# Patient Record
Sex: Male | Born: 1945
Health system: Southern US, Community
[De-identification: ages and names within clinical notes are randomized; demographics above are authoritative.]

## PROBLEM LIST (undated history)

## (undated) DIAGNOSIS — S62109A Fracture of unspecified carpal bone, unspecified wrist, initial encounter for closed fracture: Secondary | ICD-10-CM

## (undated) DIAGNOSIS — Z973 Presence of spectacles and contact lenses: Secondary | ICD-10-CM

## (undated) DIAGNOSIS — E119 Type 2 diabetes mellitus without complications: Secondary | ICD-10-CM

## (undated) DIAGNOSIS — I77810 Thoracic aortic ectasia: Secondary | ICD-10-CM

## (undated) DIAGNOSIS — K219 Gastro-esophageal reflux disease without esophagitis: Secondary | ICD-10-CM

## (undated) DIAGNOSIS — I5189 Other ill-defined heart diseases: Secondary | ICD-10-CM

## (undated) DIAGNOSIS — I10 Essential (primary) hypertension: Secondary | ICD-10-CM

## (undated) DIAGNOSIS — M5134 Other intervertebral disc degeneration, thoracic region: Secondary | ICD-10-CM

## (undated) DIAGNOSIS — L570 Actinic keratosis: Secondary | ICD-10-CM

## (undated) DIAGNOSIS — E785 Hyperlipidemia, unspecified: Secondary | ICD-10-CM

## (undated) DIAGNOSIS — I700 Atherosclerosis of aorta: Secondary | ICD-10-CM

## (undated) DIAGNOSIS — I480 Paroxysmal atrial fibrillation: Secondary | ICD-10-CM

## (undated) DIAGNOSIS — M1990 Unspecified osteoarthritis, unspecified site: Secondary | ICD-10-CM

## (undated) DIAGNOSIS — N2889 Other specified disorders of kidney and ureter: Secondary | ICD-10-CM

## (undated) DIAGNOSIS — R300 Dysuria: Secondary | ICD-10-CM

## (undated) DIAGNOSIS — K2270 Barrett's esophagus without dysplasia: Secondary | ICD-10-CM

## (undated) DIAGNOSIS — K760 Fatty (change of) liver, not elsewhere classified: Secondary | ICD-10-CM

## (undated) HISTORY — PX: PLANTAR'S WART EXCISION: SHX2240

## (undated) HISTORY — DX: Actinic keratosis: L57.0

## (undated) HISTORY — DX: Fracture of unspecified carpal bone, unspecified wrist, initial encounter for closed fracture: S62.109A

## (undated) HISTORY — DX: Dysuria: R30.0

## (undated) HISTORY — PX: STRABISMUS SURGERY: SHX218

## (undated) HISTORY — DX: Hyperlipidemia, unspecified: E78.5

## (undated) HISTORY — PX: EYE SURGERY: SHX253

---

## 2003-10-01 HISTORY — PX: COLONOSCOPY: SHX174

## 2004-07-23 ENCOUNTER — Ambulatory Visit: Payer: Self-pay | Admitting: General Surgery

## 2008-08-01 ENCOUNTER — Ambulatory Visit: Payer: Self-pay

## 2008-08-15 ENCOUNTER — Ambulatory Visit: Payer: Self-pay | Admitting: Family Medicine

## 2009-11-18 ENCOUNTER — Observation Stay: Payer: Self-pay | Admitting: Oncology

## 2014-03-22 LAB — PSA: PSA: 3.4

## 2014-03-23 LAB — FECAL OCCULT BLOOD, GUAIAC: FECAL OCCULT BLD: NEGATIVE

## 2014-05-23 ENCOUNTER — Ambulatory Visit: Payer: Self-pay | Admitting: Family Medicine

## 2014-11-11 DIAGNOSIS — L570 Actinic keratosis: Secondary | ICD-10-CM | POA: Diagnosis not present

## 2014-11-11 DIAGNOSIS — L814 Other melanin hyperpigmentation: Secondary | ICD-10-CM | POA: Diagnosis not present

## 2014-11-11 DIAGNOSIS — L578 Other skin changes due to chronic exposure to nonionizing radiation: Secondary | ICD-10-CM | POA: Diagnosis not present

## 2014-11-29 DIAGNOSIS — I10 Essential (primary) hypertension: Secondary | ICD-10-CM | POA: Diagnosis not present

## 2014-11-29 DIAGNOSIS — E780 Pure hypercholesterolemia: Secondary | ICD-10-CM | POA: Diagnosis not present

## 2014-11-29 DIAGNOSIS — E119 Type 2 diabetes mellitus without complications: Secondary | ICD-10-CM | POA: Diagnosis not present

## 2014-11-29 LAB — CBC AND DIFFERENTIAL
HEMATOCRIT: 47 % (ref 41–53)
Hemoglobin: 15.8 g/dL (ref 13.5–17.5)
Neutrophils Absolute: 55 /uL
PLATELETS: 284 10*3/uL (ref 150–399)
WBC: 5.4 10*3/mL

## 2014-11-29 LAB — HEMOGLOBIN A1C: HEMOGLOBIN A1C: 7.1 % — AB (ref 4.0–6.0)

## 2014-11-29 LAB — HEPATIC FUNCTION PANEL
ALK PHOS: 75 U/L (ref 25–125)
ALT: 21 U/L (ref 10–40)
AST: 22 U/L (ref 14–40)
Bilirubin, Total: 1.3 mg/dL

## 2014-11-29 LAB — BASIC METABOLIC PANEL
BUN: 16 mg/dL (ref 4–21)
CREATININE: 1 mg/dL (ref 0.6–1.3)
GLUCOSE: 140 mg/dL
POTASSIUM: 5 mmol/L (ref 3.4–5.3)
Sodium: 138 mmol/L (ref 137–147)

## 2014-11-29 LAB — LIPID PANEL
Cholesterol: 175 mg/dL (ref 0–200)
HDL: 75 mg/dL — AB (ref 35–70)
LDL CALC: 81 mg/dL
Triglycerides: 96 mg/dL (ref 40–160)

## 2015-01-09 DIAGNOSIS — L578 Other skin changes due to chronic exposure to nonionizing radiation: Secondary | ICD-10-CM | POA: Diagnosis not present

## 2015-01-09 DIAGNOSIS — L570 Actinic keratosis: Secondary | ICD-10-CM | POA: Diagnosis not present

## 2015-01-09 DIAGNOSIS — D229 Melanocytic nevi, unspecified: Secondary | ICD-10-CM | POA: Diagnosis not present

## 2015-01-09 DIAGNOSIS — L821 Other seborrheic keratosis: Secondary | ICD-10-CM | POA: Diagnosis not present

## 2015-01-09 DIAGNOSIS — L814 Other melanin hyperpigmentation: Secondary | ICD-10-CM | POA: Diagnosis not present

## 2015-01-09 DIAGNOSIS — L720 Epidermal cyst: Secondary | ICD-10-CM | POA: Diagnosis not present

## 2015-02-02 DIAGNOSIS — S46009A Unspecified injury of muscle(s) and tendon(s) of the rotator cuff of unspecified shoulder, initial encounter: Secondary | ICD-10-CM | POA: Insufficient documentation

## 2015-02-02 DIAGNOSIS — E119 Type 2 diabetes mellitus without complications: Secondary | ICD-10-CM | POA: Insufficient documentation

## 2015-02-02 DIAGNOSIS — K30 Functional dyspepsia: Secondary | ICD-10-CM | POA: Insufficient documentation

## 2015-02-02 DIAGNOSIS — I10 Essential (primary) hypertension: Secondary | ICD-10-CM | POA: Insufficient documentation

## 2015-03-30 ENCOUNTER — Encounter: Payer: Self-pay | Admitting: Family Medicine

## 2015-03-30 ENCOUNTER — Ambulatory Visit (INDEPENDENT_AMBULATORY_CARE_PROVIDER_SITE_OTHER): Payer: Commercial Managed Care - HMO | Admitting: Family Medicine

## 2015-03-30 VITALS — BP 128/78 | HR 64 | Temp 97.7°F | Resp 16 | Ht 74.75 in | Wt 256.2 lb

## 2015-03-30 DIAGNOSIS — Z0000 Encounter for general adult medical examination without abnormal findings: Secondary | ICD-10-CM

## 2015-03-30 DIAGNOSIS — E119 Type 2 diabetes mellitus without complications: Secondary | ICD-10-CM

## 2015-03-30 DIAGNOSIS — I10 Essential (primary) hypertension: Secondary | ICD-10-CM

## 2015-03-30 LAB — POCT URINALYSIS DIPSTICK
BILIRUBIN UA: NEGATIVE
Blood, UA: NEGATIVE
Ketones, UA: NEGATIVE
Leukocytes, UA: NEGATIVE
Nitrite, UA: NEGATIVE
PROTEIN UA: NEGATIVE
Spec Grav, UA: 1.02
UROBILINOGEN UA: 0.2
pH, UA: 6

## 2015-03-30 LAB — POCT GLYCOSYLATED HEMOGLOBIN (HGB A1C): Hemoglobin A1C: 6.5

## 2015-03-30 MED ORDER — ONETOUCH ULTRASOFT LANCETS MISC: Status: AC

## 2015-03-30 NOTE — Progress Notes (Signed)
Subjective:    Patient ID: Steven Miranda, male    DOB: 11-12-1945, 69 y.o.   MRN: [id number]  HPI Annual Exam  Patient Active Problem List   Diagnosis Date Noted  . Acid indigestion 02/02/2015  . Essential (primary) hypertension 02/02/2015  . Injury of tendon of rotator cuff 02/02/2015  . Diabetes mellitus, type 2 02/02/2015  . Diabetes 10/25/2008  . HLD (hyperlipidemia) 05/19/2007   Past Surgical History  Procedure Laterality Date  . Strabismus surgery Right    History  Substance Use Topics  . Smoking status: Former Research scientist (life sciences)  . Smokeless tobacco: Not on file  . Alcohol Use: 0.6 - 1.2 oz/week    1-2 Standard drinks or equivalent per week   Family History  Problem Relation Age of Onset  . Cancer Mother   . Alcohol abuse Father   . Heart disease Maternal Uncle   . Rheumatic fever Maternal Uncle   . Diabetes Paternal Grandmother   . Emphysema Paternal Grandfather    Current Outpatient Prescriptions on File Prior to Visit  Medication Sig Dispense Refill  . amLODipine (NORVASC) 5 MG tablet Take 1 tablet by mouth at bedtime.    Marland Kitchen aspirin 81 MG tablet Take 1 tablet by mouth daily.    Marland Kitchen atorvastatin (LIPITOR) 20 MG tablet Take 1 tablet by mouth daily.    . canagliflozin (INVOKANA) 100 MG TABS tablet Take 1 tablet by mouth daily.    . fluticasone (FLONASE) 50 MCG/ACT nasal spray Place 2 sprays into the nose as needed.    . metFORMIN (GLUCOPHAGE) 1000 MG tablet Take 1 tablet by mouth daily.    . Misc Natural Products (OSTEO BI-FLEX ADV DOUBLE ST) CAPS Take 1 capsule by mouth daily.     No current facility-administered medications on file prior to visit.   No Known Allergies Immunization History  Administered Date(s) Administered  . Tdap 08/22/2009   Review of Systems  Constitutional: Negative.   HENT: Positive for tinnitus. Negative for congestion, dental problem, ear pain, sneezing and trouble swallowing.   Eyes: Negative.  Negative for visual disturbance.   Respiratory: Negative.   Cardiovascular: Negative.   Gastrointestinal: Negative.   Genitourinary: Negative.   Musculoskeletal: Negative.   Skin: Negative.   Neurological: Negative.   Hematological: Negative.   Psychiatric/Behavioral: Negative.       BP 128/78 mmHg  Pulse 64  Temp(Src) 97.7 F (36.5 C) (Oral)  Resp 16  Ht 6' 2.75" (1.899 m)  Wt 256 lb 3.2 oz (116.212 kg)  BMI 32.23 kg/m2  Objective:   Physical Exam  Constitutional: He is oriented to person, place, and time. He appears well-developed and well-nourished.  HENT:  Head: Normocephalic and atraumatic.  Right Ear: External ear normal.  Left Ear: External ear normal.  Nose: Nose normal.  Mouth/Throat: Oropharynx is clear and moist.  History tinnitus in both ears "for years". No hearing loss.  Eyes: Conjunctivae are normal. Pupils are equal, round, and reactive to light.  Strabismus of right eye unchanged.   Neck: Normal range of motion. Neck supple.  Cardiovascular: Normal rate, regular rhythm, normal heart sounds and intact distal pulses.   Pulmonary/Chest: Effort normal and breath sounds normal.  Abdominal: Soft.  Genitourinary: Rectum normal, prostate normal and penis normal. Guaiac negative stool.  Musculoskeletal: Normal range of motion.  Neurological: He is alert and oriented to person, place, and time. He has normal reflexes.  Skin: Skin is warm and dry.  Psychiatric: He has a normal mood and  affect. His behavior is normal. Judgment and thought content normal.      Assessment & Plan:  1. Annual physical exam Good general health. Needs to continue exercise to work on weight. Last Tdap 08-22-09 and flu shot 07-08-14. Wants to check with insurance about coverage of pneumonia and shingles vaccinations. Last colonoscopy 07-23-04 by Dr. Bary Castilla was normal. Will recheck with him regarding 10 year repeat exam. Had a stress test and echocardiogram by Dr. Saralyn Pilar that was normal on 04-19-11. Urinalysis showed large  amount of glucose because patient is on Invokana for diabetes. - POCT urinalysis dipstick  2. Essential (primary) hypertension Well controlled BP on Amlodipine 5 mg qd. Tolerated and good compliance without side effects. Continue present dosage and recheck routine labs.  - Comprehensive metabolic panel - CBC with Differential/Platelet - Lipid panel  3. Type 2 diabetes mellitus without complication FBS was 903 this morning. Hgb A1C in very good shape today. Tolerating Invokana, Metformin and Atorvastatin without side effects. Will recheck labs and refill lancet prescription for testing FBS qd. Continue diet and work on exercise to lower weight. Recheck pending reports. - Lancets (ONETOUCH ULTRASOFT) lancets; Use as instructed once a day for fasting glucose check.  Dispense: 100 each; Refill: 3 - Comprehensive metabolic panel - CBC with Differential/Platelet - Lipid panel - POCT HgB A1C Lab Results  Component Value Date   HGBA1C 6.5 03/30/2015

## 2015-03-31 LAB — CBC WITH DIFFERENTIAL/PLATELET
Basophils Absolute: 0 10*3/uL (ref 0.0–0.2)
Basos: 1 %
EOS (ABSOLUTE): 0.1 10*3/uL (ref 0.0–0.4)
EOS: 2 %
HEMOGLOBIN: 16.5 g/dL (ref 12.6–17.7)
Hematocrit: 45.1 % (ref 37.5–51.0)
IMMATURE GRANS (ABS): 0 10*3/uL (ref 0.0–0.1)
Immature Granulocytes: 0 %
LYMPHS ABS: 1.3 10*3/uL (ref 0.7–3.1)
Lymphs: 26 %
MCH: 33.1 pg — ABNORMAL HIGH (ref 26.6–33.0)
MCHC: 36.6 g/dL — ABNORMAL HIGH (ref 31.5–35.7)
MCV: 90 fL (ref 79–97)
Monocytes Absolute: 0.5 10*3/uL (ref 0.1–0.9)
Monocytes: 9 %
NEUTROS ABS: 3.1 10*3/uL (ref 1.4–7.0)
Neutrophils: 62 %
PLATELETS: 231 10*3/uL (ref 150–379)
RBC: 4.99 x10E6/uL (ref 4.14–5.80)
RDW: 13.8 % (ref 12.3–15.4)
WBC: 5.1 10*3/uL (ref 3.4–10.8)

## 2015-03-31 LAB — COMPREHENSIVE METABOLIC PANEL
ALT: 19 IU/L (ref 0–44)
AST: 18 IU/L (ref 0–40)
Albumin/Globulin Ratio: 1.8 (ref 1.1–2.5)
Albumin: 4.6 g/dL (ref 3.6–4.8)
Alkaline Phosphatase: 66 IU/L (ref 39–117)
BUN / CREAT RATIO: 13 (ref 10–22)
BUN: 13 mg/dL (ref 8–27)
Bilirubin Total: 0.9 mg/dL (ref 0.0–1.2)
CO2: 25 mmol/L (ref 18–29)
Calcium: 9.3 mg/dL (ref 8.6–10.2)
Chloride: 98 mmol/L (ref 97–108)
Creatinine, Ser: 1.03 mg/dL (ref 0.76–1.27)
GFR calc non Af Amer: 75 mL/min/{1.73_m2} (ref >59)
GFR, EST AFRICAN AMERICAN: 86 mL/min/{1.73_m2} (ref >59)
Globulin, Total: 2.6 g/dL (ref 1.5–4.5)
Glucose: 127 mg/dL — ABNORMAL HIGH (ref 65–99)
POTASSIUM: 4.9 mmol/L (ref 3.5–5.2)
Sodium: 139 mmol/L (ref 134–144)
TOTAL PROTEIN: 7.2 g/dL (ref 6.0–8.5)

## 2015-03-31 LAB — LIPID PANEL
Chol/HDL Ratio: 2.2 ratio units (ref 0.0–5.0)
Cholesterol, Total: 147 mg/dL (ref 100–199)
HDL: 68 mg/dL (ref >39)
LDL Calculated: 66 mg/dL (ref 0–99)
Triglycerides: 64 mg/dL (ref 0–149)
VLDL CHOLESTEROL CAL: 13 mg/dL (ref 5–40)

## 2015-04-04 ENCOUNTER — Telehealth: Payer: Self-pay

## 2015-04-04 NOTE — Telephone Encounter (Signed)
Patient advised as directed below. Patient verbalized understanding. Patient scheduled for 4 month follow up appointment.

## 2015-04-04 NOTE — Telephone Encounter (Signed)
-----   Message from Margo Common, [place] sent at 03/31/2015  6:47 PM EDT ----- Normal liver and kidney function. Blood sugar OK. Very good cholesterol and triglyceride levels. Recheck in 4 months.

## 2015-04-17 DIAGNOSIS — R0602 Shortness of breath: Secondary | ICD-10-CM | POA: Diagnosis not present

## 2015-04-17 DIAGNOSIS — J209 Acute bronchitis, unspecified: Secondary | ICD-10-CM | POA: Diagnosis not present

## 2015-04-17 DIAGNOSIS — R05 Cough: Secondary | ICD-10-CM | POA: Diagnosis not present

## 2015-04-17 DIAGNOSIS — J019 Acute sinusitis, unspecified: Secondary | ICD-10-CM | POA: Diagnosis not present

## 2015-04-17 DIAGNOSIS — R0982 Postnasal drip: Secondary | ICD-10-CM | POA: Diagnosis not present

## 2015-05-03 ENCOUNTER — Telehealth: Payer: Self-pay | Admitting: Family Medicine

## 2015-05-03 NOTE — Telephone Encounter (Signed)
Pt called wanting to discontinue his invokana.  Please call him back.  Thanks,  Con Memos

## 2015-05-03 NOTE — Telephone Encounter (Signed)
Returned patient's call. Patient states Steven Miranda is costing around $270 monthly which increased from $131 monthly, and he is not willing to continue paying that amount for the medication.

## 2015-05-05 NOTE — Telephone Encounter (Signed)
Hgb A1C on 03-30-15 was 6.5 on the Invokana and Metformin. Understand falling in the "donut hole" has created financial issues. May stop the Invokana when you finish the present prescription and just continue with teh Meftormin 1000 mg qd. Continue to work on diet and weight loss. Will keep follow up appointment in November 2016.

## 2015-06-26 DIAGNOSIS — H524 Presbyopia: Secondary | ICD-10-CM | POA: Diagnosis not present

## 2015-06-26 DIAGNOSIS — H5210 Myopia, unspecified eye: Secondary | ICD-10-CM | POA: Diagnosis not present

## 2015-07-04 ENCOUNTER — Encounter: Payer: Self-pay | Admitting: Family Medicine

## 2015-07-10 DIAGNOSIS — L570 Actinic keratosis: Secondary | ICD-10-CM | POA: Diagnosis not present

## 2015-07-10 DIAGNOSIS — L578 Other skin changes due to chronic exposure to nonionizing radiation: Secondary | ICD-10-CM | POA: Diagnosis not present

## 2015-08-03 ENCOUNTER — Ambulatory Visit (INDEPENDENT_AMBULATORY_CARE_PROVIDER_SITE_OTHER): Payer: Commercial Managed Care - HMO | Admitting: Family Medicine

## 2015-08-03 ENCOUNTER — Encounter: Payer: Self-pay | Admitting: Family Medicine

## 2015-08-03 VITALS — BP 138/82 | HR 60 | Temp 98.1°F | Resp 16 | Wt 262.8 lb

## 2015-08-03 DIAGNOSIS — I10 Essential (primary) hypertension: Secondary | ICD-10-CM

## 2015-08-03 DIAGNOSIS — E785 Hyperlipidemia, unspecified: Secondary | ICD-10-CM | POA: Diagnosis not present

## 2015-08-03 DIAGNOSIS — E0800 Diabetes mellitus due to underlying condition with hyperosmolarity without nonketotic hyperglycemic-hyperosmolar coma (NKHHC): Secondary | ICD-10-CM | POA: Diagnosis not present

## 2015-08-03 MED ORDER — AMLODIPINE BESYLATE 5 MG PO TABS: 5.0000 mg | ORAL_TABLET | Freq: Every day | ORAL | Status: DC

## 2015-08-03 MED ORDER — METFORMIN HCL 1000 MG PO TABS: 1000.0000 mg | ORAL_TABLET | Freq: Every day | ORAL | Status: DC

## 2015-08-03 MED ORDER — ATORVASTATIN CALCIUM 20 MG PO TABS: 20.0000 mg | ORAL_TABLET | Freq: Every day | ORAL | Status: DC

## 2015-08-03 NOTE — Progress Notes (Signed)
Patient ID: Steven Miranda, male   DOB: 08/26/1946, 69 y.o.   MRN: [id number]     Subjective:  Diabetes He presents for his follow-up diabetic visit. He has type 2 diabetes mellitus. His disease course has been stable. There are no hypoglycemic associated symptoms. Symptoms are stable.  Hypertension This is a chronic problem. The problem is controlled.     Prior to Admission medications   Medication Sig Start Date End Date Taking? Authorizing Provider  amLODipine (NORVASC) 5 MG tablet Take 1 tablet by mouth at bedtime. 08/02/14  Yes Historical Provider, MD  aspirin 81 MG tablet Take 1 tablet by mouth daily.   Yes Historical Provider, MD  atorvastatin (LIPITOR) 20 MG tablet Take 1 tablet by mouth daily. 08/02/14  Yes Historical Provider, MD  fluticasone (FLONASE) 50 MCG/ACT nasal spray Place 2 sprays into the nose as needed. 01/13/14  Yes Historical Provider, MD  Lancets (ONETOUCH ULTRASOFT) lancets Use as instructed once a day for fasting glucose check. 03/30/15  Yes Dennis E Chrismon, PA  metFORMIN (GLUCOPHAGE) 1000 MG tablet Take 1 tablet by mouth daily. 08/02/14  Yes Historical Provider, MD  Misc Natural Products (OSTEO BI-FLEX ADV DOUBLE ST) CAPS Take 1 capsule by mouth daily.   Yes Historical Provider, MD  canagliflozin (INVOKANA) 100 MG TABS tablet Take 1 tablet by mouth daily. 01/13/15   Historical Provider, MD    Patient Active Problem List   Diagnosis Date Noted  . Acid indigestion 02/02/2015  . Essential (primary) hypertension 02/02/2015  . Injury of tendon of rotator cuff 02/02/2015  . Diabetes mellitus, type 2 ([place]) 02/02/2015  . Diabetes ([place]) 10/25/2008  . HLD (hyperlipidemia) 05/19/2007    History reviewed. No pertinent past medical history.  Social History   Social History  . Marital Status: Married    Spouse Name: N/A  . Number of Children: N/A  . Years of Education: N/A   Occupational History  . Not on file.   Social History Main Topics  . Smoking status:  Former Research scientist (life sciences)  . Smokeless tobacco: Not on file  . Alcohol Use: 0.6 - 1.2 oz/week    1-2 Standard drinks or equivalent per week  . Drug Use: No  . Sexual Activity: Not on file   Other Topics Concern  . Not on file   Social History Narrative   No Known Allergies  Review of Systems  Constitutional: Negative.   HENT: Negative.   Eyes: Negative.   Respiratory: Negative.   Cardiovascular: Negative.   Gastrointestinal: Negative.   Genitourinary: Negative.   Musculoskeletal: Negative.   Skin: Negative.   Neurological: Negative.   Endo/Heme/Allergies: Negative.   Psychiatric/Behavioral: Negative.     Immunization History  Administered Date(s) Administered  . Influenza-Unspecified 06/15/2015  . Tdap 08/22/2009   Objective:  BP 138/82 mmHg  Pulse 60  Temp(Src) 98.1 F (36.7 C) (Oral)  Resp 16  Wt 262 lb 12.8 oz (119.205 kg)  SpO2 95% Wt Readings from Last 3 Encounters:  08/03/15 262 lb 12.8 oz (119.205 kg)  03/30/15 256 lb 3.2 oz (116.212 kg)  11/29/14 253 lb (114.76 kg)    Physical Exam  Constitutional: He is oriented to person, place, and time and well-developed, well-nourished, and in no distress.  HENT:  Head: Normocephalic.  Eyes:  Unchanged strabismus right eye.  Neck: Normal range of motion. Neck supple.  Cardiovascular: Normal rate, regular rhythm and normal heart sounds.   Pulmonary/Chest: Effort normal and breath sounds normal.  Abdominal: Soft. Bowel sounds  are normal.  Neurological: He is alert and oriented to person, place, and time. He has normal sensation.  Normal sensation in feet to nylon string testing.  Psychiatric: Affect and judgment normal.    Lab Results  Component Value Date   WBC 5.1 03/30/2015   HGB 15.8 11/29/2014   HCT 45.1 03/30/2015   PLT 284 11/29/2014   GLUCOSE 127* 03/30/2015   CHOL 147 03/30/2015   TRIG 64 03/30/2015   HDL 68 03/30/2015   LDLCALC 66 03/30/2015   PSA 3.4 03/22/2014   HGBA1C 6.5 03/30/2015   CMP       Component Value Date/Time   NA 139 03/30/2015 1023   K 4.9 03/30/2015 1023   CL 98 03/30/2015 1023   CO2 25 03/30/2015 1023   GLUCOSE 127* 03/30/2015 1023   BUN 13 03/30/2015 1023   CREATININE 1.03 03/30/2015 1023   CREATININE 1.0 11/29/2014   CALCIUM 9.3 03/30/2015 1023   PROT 7.2 03/30/2015 1023   ALBUMIN 4.6 03/30/2015 1023   AST 18 03/30/2015 1023   ALT 19 03/30/2015 1023   ALKPHOS 66 03/30/2015 1023   BILITOT 0.9 03/30/2015 1023   GFRNONAA 75 03/30/2015 1023   GFRAA 86 03/30/2015 1023   Assessment and Plan :  1. Diabetes mellitus due to underlying condition with hyperosmolarity without coma, without long-term current use of insulin ([place]) Been off the Invokana due to doughnut hole. Notices FBS in the 140 range recently. No hypoglycemic episodes. Had eye exam approximately a month ago without retinopathy. Will give samples of Invokana to help cover the gap. Get follow up labs and recheck in 3 months pending lab reports. - metFORMIN (GLUCOPHAGE) 1000 MG tablet; Take 1 tablet (1,000 mg total) by mouth daily.  Dispense: 90 tablet; Refill: 3 - Hemoglobin A1c - Comprehensive metabolic panel - CBC with Differential/Platelet  2. HLD (hyperlipidemia) Tolerating Lipitor without side effects. Continue present dosage and get lipit panel with TSH. Has gained 9 lbs. Since March 2016. Encouraged to exercise and watch diet closer.  - atorvastatin (LIPITOR) 20 MG tablet; Take 1 tablet (20 mg total) by mouth daily.  Dispense: 90 tablet; Refill: 3  3. Essential (primary) hypertension Good control. Tolerating Norvasc without peripheral edema or hypotensive episodes. Recheck labs and follow up pending reports. - amLODipine (NORVASC) 5 MG tablet; Take 1 tablet (5 mg total) by mouth at bedtime.  Dispense: 90 tablet; Refill: 3 - Lipid panel - TSH  [facility] 08/03/2015 8:41 AM

## 2015-08-04 ENCOUNTER — Telehealth: Payer: Self-pay

## 2015-08-04 LAB — COMPREHENSIVE METABOLIC PANEL
A/G RATIO: 1.9 (ref 1.1–2.5)
ALK PHOS: 75 IU/L (ref 39–117)
ALT: 26 IU/L (ref 0–44)
AST: 22 IU/L (ref 0–40)
Albumin: 4.7 g/dL (ref 3.6–4.8)
BILIRUBIN TOTAL: 1.1 mg/dL (ref 0.0–1.2)
BUN/Creatinine Ratio: 12 (ref 10–22)
BUN: 12 mg/dL (ref 8–27)
CO2: 26 mmol/L (ref 18–29)
Calcium: 9.5 mg/dL (ref 8.6–10.2)
Chloride: 99 mmol/L (ref 97–106)
Creatinine, Ser: 1.04 mg/dL (ref 0.76–1.27)
GFR, EST AFRICAN AMERICAN: 85 mL/min/{1.73_m2} (ref >59)
GFR, EST NON AFRICAN AMERICAN: 74 mL/min/{1.73_m2} (ref >59)
GLUCOSE: 173 mg/dL — AB (ref 65–99)
Globulin, Total: 2.5 g/dL (ref 1.5–4.5)
POTASSIUM: 4.9 mmol/L (ref 3.5–5.2)
Sodium: 140 mmol/L (ref 136–144)
TOTAL PROTEIN: 7.2 g/dL (ref 6.0–8.5)

## 2015-08-04 LAB — CBC WITH DIFFERENTIAL/PLATELET
BASOS: 0 %
Basophils Absolute: 0 10*3/uL (ref 0.0–0.2)
EOS (ABSOLUTE): 0.1 10*3/uL (ref 0.0–0.4)
Eos: 3 %
HEMATOCRIT: 44.2 % (ref 37.5–51.0)
HEMOGLOBIN: 15.7 g/dL (ref 12.6–17.7)
IMMATURE GRANS (ABS): 0 10*3/uL (ref 0.0–0.1)
Immature Granulocytes: 0 %
LYMPHS: 32 %
Lymphocytes Absolute: 1.6 10*3/uL (ref 0.7–3.1)
MCH: 32.4 pg (ref 26.6–33.0)
MCHC: 35.5 g/dL (ref 31.5–35.7)
MCV: 91 fL (ref 79–97)
MONOCYTES: 10 %
Monocytes Absolute: 0.5 10*3/uL (ref 0.1–0.9)
NEUTROS ABS: 2.8 10*3/uL (ref 1.4–7.0)
Neutrophils: 55 %
Platelets: 241 10*3/uL (ref 150–379)
RBC: 4.85 x10E6/uL (ref 4.14–5.80)
RDW: 12.5 % (ref 12.3–15.4)
WBC: 5.1 10*3/uL (ref 3.4–10.8)

## 2015-08-04 LAB — LIPID PANEL
Chol/HDL Ratio: 2.3 ratio units (ref 0.0–5.0)
Cholesterol, Total: 149 mg/dL (ref 100–199)
HDL: 64 mg/dL (ref >39)
LDL CALC: 66 mg/dL (ref 0–99)
Triglycerides: 94 mg/dL (ref 0–149)
VLDL CHOLESTEROL CAL: 19 mg/dL (ref 5–40)

## 2015-08-04 LAB — HEMOGLOBIN A1C
Est. average glucose Bld gHb Est-mCnc: 192 mg/dL
Hgb A1c MFr Bld: 8.3 % — ABNORMAL HIGH (ref 4.8–5.6)

## 2015-08-04 LAB — TSH: TSH: 3.24 u[IU]/mL (ref 0.450–4.500)

## 2015-08-04 NOTE — Telephone Encounter (Signed)
-----   Message from Margo Common, [place] sent at 08/04/2015  9:42 AM EDT ----- All blood tests normal except blood sugar and Hgb A1C shot up. Getting back on the Invokana should help. Recheck in 3 months as planned.

## 2015-08-04 NOTE — Telephone Encounter (Signed)
Patient advised as directed below. Patient was currently driving will call back Monday to schedule the 3 month appointment.  Thanks,  -Thessaly Mccullers

## 2015-08-22 ENCOUNTER — Ambulatory Visit (INDEPENDENT_AMBULATORY_CARE_PROVIDER_SITE_OTHER): Payer: Commercial Managed Care - HMO | Admitting: Family Medicine

## 2015-08-22 ENCOUNTER — Encounter: Payer: Self-pay | Admitting: Family Medicine

## 2015-08-22 VITALS — BP 132/78 | HR 72 | Temp 97.7°F | Resp 16 | Wt 259.6 lb

## 2015-08-22 DIAGNOSIS — J0100 Acute maxillary sinusitis, unspecified: Secondary | ICD-10-CM

## 2015-08-22 MED ORDER — AMOXICILLIN 875 MG PO TABS
875.0000 mg | ORAL_TABLET | Freq: Two times a day (BID) | ORAL | Status: DC
Start: 2015-08-22 — End: 2016-05-10

## 2015-08-22 NOTE — Progress Notes (Signed)
Patient ID: Steven Miranda, male   DOB: November 13, 1945, 69 y.o.   MRN: [id number] Name: KAISYN HERNANDO   MRN: [id number]    DOB: 1946/08/22   Date:08/22/2015       Progress Note  Subjective  Chief Complaint  Chief Complaint  Patient presents with  . Sinusitis    Sinusitis This is a new problem. Episode onset: past 2-3 days. The problem has been gradually worsening since onset. There has been no fever. Associated symptoms include congestion, ear pain, headaches and sinus pressure. Pertinent negatives include no coughing, hoarse voice, sneezing or sore throat. Past treatments include oral decongestants. The treatment provided no relief.   Past Surgical History  Procedure Laterality Date  . Strabismus surgery Right    Patient Active Problem List   Diagnosis Date Noted  . Acid indigestion 02/02/2015  . Essential (primary) hypertension 02/02/2015  . Injury of tendon of rotator cuff 02/02/2015  . Diabetes mellitus, type 2 ([place]) 02/02/2015  . Diabetes ([place]) 10/25/2008  . HLD (hyperlipidemia) 05/19/2007   Social History  Substance Use Topics  . Smoking status: Former Research scientist (life sciences)  . Smokeless tobacco: Not on file  . Alcohol Use: 0.6 - 1.2 oz/week    1-2 Standard drinks or equivalent per week    Current outpatient prescriptions:  .  amLODipine (NORVASC) 5 MG tablet, Take 1 tablet (5 mg total) by mouth at bedtime., Disp: 90 tablet, Rfl: 3 .  aspirin 81 MG tablet, Take 1 tablet by mouth daily., Disp: , Rfl:  .  atorvastatin (LIPITOR) 20 MG tablet, Take 1 tablet (20 mg total) by mouth daily., Disp: 90 tablet, Rfl: 3 .  canagliflozin (INVOKANA) 100 MG TABS tablet, Take 1 tablet by mouth daily., Disp: , Rfl:  .  fluticasone (FLONASE) 50 MCG/ACT nasal spray, Place 2 sprays into the nose as needed., Disp: , Rfl:  .  Lancets (ONETOUCH ULTRASOFT) lancets, Use as instructed once a day for fasting glucose check., Disp: 100 each, Rfl: 3 .  metFORMIN (GLUCOPHAGE) 1000 MG tablet, Take 1 tablet (1,000 mg  total) by mouth daily., Disp: 90 tablet, Rfl: 3 .  Misc Natural Products (OSTEO BI-FLEX ADV DOUBLE ST) CAPS, Take 1 capsule by mouth daily., Disp: , Rfl:   No Known Allergies  Review of Systems  Constitutional: Negative.   HENT: Positive for congestion, ear pain and sinus pressure. Negative for hoarse voice, sneezing and sore throat.   Eyes: Negative.   Respiratory: Negative.  Negative for cough.   Cardiovascular: Negative.   Gastrointestinal: Negative.   Genitourinary: Negative.   Musculoskeletal: Negative.   Skin: Negative.   Neurological: Positive for headaches.  Endo/Heme/Allergies: Negative.   Psychiatric/Behavioral: Negative.    Objective  Filed Vitals:   08/22/15 1329  BP: 132/78  Pulse: 72  Temp: 97.7 F (36.5 C)  TempSrc: Oral  Resp: 16  Weight: 259 lb 9.6 oz (117.754 kg)  SpO2: 97%   Physical Exam  Constitutional: He is oriented to person, place, and time and well-developed, well-nourished, and in no distress.  HENT:  Head: Normocephalic.  Right Ear: External ear normal.  Left Ear: External ear normal.  Tender maxillary sinuses. Slightly injected posterior pharynx without exudates.  Eyes: Conjunctivae are normal.  Unchanged strabismus of right eye.  Neck: Neck supple.  Cardiovascular: Normal rate, regular rhythm and normal heart sounds.   Pulmonary/Chest: Effort normal and breath sounds normal.  Abdominal: Soft.  Lymphadenopathy:    He has no cervical adenopathy.  Neurological: He is alert and  oriented to person, place, and time.  Skin: No rash noted.  Psychiatric: Affect and judgment normal.    Recent Results (from the past 2160 hour(s))  Hemoglobin A1c     Status: Abnormal   Collection Time: 08/03/15  9:27 AM  Result Value Ref Range   Hgb A1c MFr Bld 8.3 (H) 4.8 - 5.6 %    Comment:          Pre-diabetes: 5.7 - 6.4          Diabetes: >6.4          Glycemic control for adults with diabetes: <7.0    Est. average glucose Bld gHb Est-mCnc 192 mg/dL   Comprehensive metabolic panel     Status: Abnormal   Collection Time: 08/03/15  9:27 AM  Result Value Ref Range   Glucose 173 (H) 65 - 99 mg/dL   BUN 12 8 - 27 mg/dL   Creatinine, Ser 1.04 0.76 - 1.27 mg/dL   GFR calc non Af Amer 74 >59 mL/min/1.73   GFR calc Af Amer 85 >59 mL/min/1.73   BUN/Creatinine Ratio 12 10 - 22   Sodium 140 136 - 144 mmol/L   Potassium 4.9 3.5 - 5.2 mmol/L   Chloride 99 97 - 106 mmol/L   CO2 26 18 - 29 mmol/L   Calcium 9.5 8.6 - 10.2 mg/dL   Total Protein 7.2 6.0 - 8.5 g/dL   Albumin 4.7 3.6 - 4.8 g/dL   Globulin, Total 2.5 1.5 - 4.5 g/dL   Albumin/Globulin Ratio 1.9 1.1 - 2.5   Bilirubin Total 1.1 0.0 - 1.2 mg/dL   Alkaline Phosphatase 75 39 - 117 IU/L   AST 22 0 - 40 IU/L   ALT 26 0 - 44 IU/L  CBC with Differential/Platelet     Status: None   Collection Time: 08/03/15  9:27 AM  Result Value Ref Range   WBC 5.1 3.4 - 10.8 x10E3/uL   RBC 4.85 4.14 - 5.80 x10E6/uL   Hemoglobin 15.7 12.6 - 17.7 g/dL   Hematocrit 44.2 37.5 - 51.0 %   MCV 91 79 - 97 fL   MCH 32.4 26.6 - 33.0 pg   MCHC 35.5 31.5 - 35.7 g/dL   RDW 12.5 12.3 - 15.4 %   Platelets 241 150 - 379 x10E3/uL   Neutrophils 55 %   Lymphs 32 %   Monocytes 10 %   Eos 3 %   Basos 0 %   Neutrophils Absolute 2.8 1.4 - 7.0 x10E3/uL   Lymphocytes Absolute 1.6 0.7 - 3.1 x10E3/uL   Monocytes Absolute 0.5 0.1 - 0.9 x10E3/uL   EOS (ABSOLUTE) 0.1 0.0 - 0.4 x10E3/uL   Basophils Absolute 0.0 0.0 - 0.2 x10E3/uL   Immature Granulocytes 0 %   Immature Grans (Abs) 0.0 0.0 - 0.1 x10E3/uL  Lipid panel     Status: None   Collection Time: 08/03/15  9:27 AM  Result Value Ref Range   Cholesterol, Total 149 100 - 199 mg/dL   Triglycerides 94 0 - 149 mg/dL   HDL 64 >39 mg/dL    Comment: According to ATP-III Guidelines, HDL-C >59 mg/dL is considered a negative risk factor for CHD.    VLDL Cholesterol Cal 19 5 - 40 mg/dL   LDL Calculated 66 0 - 99 mg/dL   Chol/HDL Ratio 2.3 0.0 - 5.0 ratio units    Comment:  T. Chol/HDL Ratio                                             Men  Women                               1/2 Avg.Risk  3.4    3.3                                   Avg.Risk  5.0    4.4                                2X Avg.Risk  9.6    7.1                                3X Avg.Risk 23.4   11.0   TSH     Status: None   Collection Time: 08/03/15  9:27 AM  Result Value Ref Range   TSH 3.240 0.450 - 4.500 uIU/mL   Assessment & Plan  1. Acute maxillary sinusitis, recurrence not specified Onset over the past 2-3 days with sinus pressure and headaches. No fevers. Will treat with Mucinex-D and Amoxicillin. May add Tylenol or Advil prn. Increase fluid intake and recheck if no better in a week. - amoxicillin (AMOXIL) 875 MG tablet; Take 1 tablet (875 mg total) by mouth 2 (two) times daily.  Dispense: 20 tablet; Refill: 0

## 2015-09-04 DIAGNOSIS — Z0100 Encounter for examination of eyes and vision without abnormal findings: Secondary | ICD-10-CM | POA: Diagnosis not present

## 2015-09-26 ENCOUNTER — Telehealth: Payer: Self-pay

## 2015-09-26 NOTE — Telephone Encounter (Signed)
Patient would like to get his CPE office note copy, copy of his bill/statement/receipt for that visit for his insurance purpose. He is bringing his dad tomorrow for appt and would like to pick that up then if possible.-aa

## 2015-10-03 ENCOUNTER — Other Ambulatory Visit: Payer: Self-pay | Admitting: Family Medicine

## 2015-10-03 MED ORDER — GLUCOSE BLOOD VI STRP: ORAL_STRIP | Status: DC

## 2015-10-03 MED ORDER — CANAGLIFLOZIN 100 MG PO TABS: 100.0000 mg | ORAL_TABLET | Freq: Every day | ORAL | Status: DC

## 2015-10-03 NOTE — Telephone Encounter (Signed)
Refills sent to [facility].

## 2015-10-03 NOTE — Telephone Encounter (Signed)
Pt contacted office for refill request on the following medications: canagliflozin (INVOKANA) 100 MG TABS tablet  Accucheck Smart View Test Strips  to [employer] for 90 day supply. Thanks TNP

## 2015-10-04 NOTE — Telephone Encounter (Signed)
Left patient a voicemail advising him that refills have been sent to [facility].

## 2016-01-08 DIAGNOSIS — D1800 Hemangioma unspecified site: Secondary | ICD-10-CM | POA: Diagnosis not present

## 2016-01-08 DIAGNOSIS — D229 Melanocytic nevi, unspecified: Secondary | ICD-10-CM | POA: Diagnosis not present

## 2016-01-08 DIAGNOSIS — L570 Actinic keratosis: Secondary | ICD-10-CM | POA: Diagnosis not present

## 2016-01-08 DIAGNOSIS — L820 Inflamed seborrheic keratosis: Secondary | ICD-10-CM | POA: Diagnosis not present

## 2016-01-08 DIAGNOSIS — Q825 Congenital non-neoplastic nevus: Secondary | ICD-10-CM | POA: Diagnosis not present

## 2016-01-08 DIAGNOSIS — L578 Other skin changes due to chronic exposure to nonionizing radiation: Secondary | ICD-10-CM | POA: Diagnosis not present

## 2016-01-08 DIAGNOSIS — Z1283 Encounter for screening for malignant neoplasm of skin: Secondary | ICD-10-CM | POA: Diagnosis not present

## 2016-01-08 DIAGNOSIS — L821 Other seborrheic keratosis: Secondary | ICD-10-CM | POA: Diagnosis not present

## 2016-01-08 DIAGNOSIS — L720 Epidermal cyst: Secondary | ICD-10-CM | POA: Diagnosis not present

## 2016-05-10 ENCOUNTER — Ambulatory Visit (INDEPENDENT_AMBULATORY_CARE_PROVIDER_SITE_OTHER): Payer: Commercial Managed Care - HMO | Admitting: Family Medicine

## 2016-05-10 ENCOUNTER — Encounter: Payer: Self-pay | Admitting: Family Medicine

## 2016-05-10 VITALS — BP 120/76 | HR 64 | Temp 97.8°F | Resp 16 | Ht 75.0 in | Wt 251.0 lb

## 2016-05-10 DIAGNOSIS — I10 Essential (primary) hypertension: Secondary | ICD-10-CM | POA: Diagnosis not present

## 2016-05-10 DIAGNOSIS — Z125 Encounter for screening for malignant neoplasm of prostate: Secondary | ICD-10-CM

## 2016-05-10 DIAGNOSIS — Z0000 Encounter for general adult medical examination without abnormal findings: Secondary | ICD-10-CM

## 2016-05-10 DIAGNOSIS — E785 Hyperlipidemia, unspecified: Secondary | ICD-10-CM

## 2016-05-10 DIAGNOSIS — E119 Type 2 diabetes mellitus without complications: Secondary | ICD-10-CM | POA: Diagnosis not present

## 2016-05-10 DIAGNOSIS — Z1211 Encounter for screening for malignant neoplasm of colon: Secondary | ICD-10-CM

## 2016-05-10 DIAGNOSIS — Z23 Encounter for immunization: Secondary | ICD-10-CM

## 2016-05-10 LAB — IFOBT (OCCULT BLOOD): IMMUNOLOGICAL FECAL OCCULT BLOOD TEST: NEGATIVE

## 2016-05-10 NOTE — Progress Notes (Signed)
Patient: Steven Miranda, Male    DOB: 10/30/1945, 70 y.o.   MRN: [id number] Visit Date: 05/10/2016  Today's Provider: Vernie Murders, PA   Chief Complaint  Patient presents with  . Medicare Wellness   Subjective:    Annual wellness visit Steven Miranda is a 70 y.o. male. He feels well. He reports exercising 4-5 days a week. He reports he is sleeping well.  03/30/15 AWE 03/22/14 PSA 3.4 07/23/04 Colonoscopy -----------------------------------------------------------   Review of Systems  Constitutional: Negative.   HENT: Positive for sinus pressure and tinnitus.   Eyes: Negative.   Respiratory: Negative.   Gastrointestinal: Negative.   Endocrine: Negative.   Genitourinary: Negative.   Musculoskeletal: Negative.   Skin: Negative.   Allergic/Immunologic: Negative.   Neurological: Negative.   Hematological: Negative.   Psychiatric/Behavioral: Negative.     Social History   Social History  . Marital status: Married    Spouse name: N/A  . Number of children: N/A  . Years of education: N/A   Occupational History  . Not on file.   Social History Main Topics  . Smoking status: Former Research scientist (life sciences)  . Smokeless tobacco: Never Used  . Alcohol use 0.6 - 1.2 oz/week    1 - 2 Standard drinks or equivalent per week  . Drug use: No  . Sexual activity: Not on file   Other Topics Concern  . Not on file   Social History Narrative  . No narrative on file    History reviewed. No pertinent past medical history.   Patient Active Problem List   Diagnosis Date Noted  . Acid indigestion 02/02/2015  . Essential (primary) hypertension 02/02/2015  . Injury of tendon of rotator cuff 02/02/2015  . Diabetes mellitus, type 2 ([place]) 02/02/2015  . Diabetes ([place]) 10/25/2008  . HLD (hyperlipidemia) 05/19/2007    Past Surgical History:  Procedure Laterality Date  . STRABISMUS SURGERY Right     His family history includes Alcohol abuse in his father; Cancer in his mother;  Diabetes in his paternal grandmother; Emphysema in his paternal grandfather; Heart disease in his maternal uncle; Rheumatic fever in his maternal uncle.    Current Meds  Medication Sig  . amLODipine (NORVASC) 5 MG tablet Take 1 tablet (5 mg total) by mouth at bedtime.  Marland Kitchen aspirin 81 MG tablet Take 1 tablet by mouth daily.  Marland Kitchen atorvastatin (LIPITOR) 20 MG tablet Take 1 tablet (20 mg total) by mouth daily.  . canagliflozin (INVOKANA) 100 MG TABS tablet Take 1 tablet (100 mg total) by mouth daily.  . Ferrous Sulfate (IRON SUPPLEMENT PO) Take 1 tablet by mouth daily.  . fluticasone (FLONASE) 50 MCG/ACT nasal spray Place 2 sprays into the nose as needed.  Marland Kitchen glucose blood (ACCU-CHEK SMARTVIEW) test strip Use as instructed to check fasting blood sugar each morning.  . Lancets (ONETOUCH ULTRASOFT) lancets Use as instructed once a day for fasting glucose check.  . metFORMIN (GLUCOPHAGE) 1000 MG tablet Take 1 tablet (1,000 mg total) by mouth daily.  . Misc Natural Products (OSTEO BI-FLEX ADV DOUBLE ST) CAPS Take 1 capsule by mouth daily.    Patient Care Team: Margo Common, PA as PCP - General (Physician Assistant)    Objective:   Vitals: BP 120/76 (BP Location: Right Arm, Patient Position: Sitting, Cuff Size: Large)   Pulse 64   Temp 97.8 F (36.6 C) (Oral)   Resp 16   Ht 6\' 3"  (1.905 m)   Wt 251  lb (113.9 kg)   BMI 31.37 kg/m   Wt Readings from Last 3 Encounters:  05/10/16 251 lb (113.9 kg)  08/22/15 259 lb 9.6 oz (117.8 kg)  08/03/15 262 lb 12.8 oz (119.2 kg)    Physical Exam  Constitutional: He is oriented to person, place, and time. He appears well-developed and well-nourished.  HENT:  Head: Normocephalic and atraumatic.  Right Ear: External ear normal.  Left Ear: External ear normal.  Nose: Nose normal.  Mouth/Throat: Oropharynx is clear and moist.  Eyes: Conjunctivae and EOM are normal. Pupils are equal, round, and reactive to light.  Unchanged strabismus of the right  eye. Amblyopia of the right eye. Focuses vision out of the left eye.  Neck: Normal range of motion. Neck supple.  Cardiovascular: Normal rate, regular rhythm, normal heart sounds and intact distal pulses.   Pulmonary/Chest: Effort normal and breath sounds normal.  Abdominal: Soft. Bowel sounds are normal.  Genitourinary: Rectum normal, prostate normal and penis normal. Rectal exam shows guaiac negative stool.  Genitourinary Comments: Scar over the sacrum at the top of the buttock cleft from past pilonidal cyst surgery.  Musculoskeletal: Normal range of motion.  Neurological: He is alert and oriented to person, place, and time.  Skin: Skin is warm and dry.  Psychiatric: He has a normal mood and affect. His behavior is normal. Judgment and thought content normal.    Activities of Daily Living In your present state of health, do you have any difficulty performing the following activities: 05/10/2016  Hearing? N  Vision? N  Difficulty concentrating or making decisions? N  Walking or climbing stairs? N  Dressing or bathing? N  Doing errands, shopping? N  Some recent data might be hidden    Fall Risk Assessment Fall Risk  05/10/2016 03/30/2015  Falls in the past year? No No     Depression Screen PHQ 2/9 Scores 05/10/2016 03/30/2015  PHQ - 2 Score 0 0    Cognitive Testing - 6-CIT  Correct? Score   What year is it? yes 0 0 or 4  What month is it? yes 0 0 or 3  Memorize:    Pia Mau,  44,  [address]  [place],      What time is it? (within 1 hour) yes 0 0 or 3  Count backwards from 20 yes 0 0, 2, or 4  Name the months of the year yes 0 0, 2, or 4  Repeat name & address above yes 0 0, 2, 4, 6, 8, or 10       TOTAL SCORE  0/28   Interpretation:  Normal  Normal (0-7) Abnormal (8-28)    Assessment & Plan:     Annual Wellness Visit  Reviewed patient's Family Medical History Reviewed and updated list of patient's medical providers Assessment of cognitive impairment was  done Assessed patient's functional ability Established a written schedule for health screening [place] Completed and Reviewed  Exercise Activities and Dietary recommendations Goals    Continues to exercise regularly 3-4 days a week for 30 minutes. Trying to follow low fat diabetic diet.      Immunization History  Administered Date(s) Administered  . Hepatitis A 02/02/2004  . Influenza-Unspecified 06/15/2015  . Pneumococcal Conjugate-13 05/10/2016  . Tdap 08/22/2009    ------------------------------------------------------------------------------------------------------------ 1. Medicare annual wellness visit, subsequent Essentially normal exam with above assessments. Last colonoscopy was in 2005. Will consider repeat this year.  2. Type 2 diabetes mellitus without complication, without long-term  current use of insulin (HCC) States FBS in the 140's recently. Will schedule follow up and labs in 2 weeks. Continues to use Invokana 100 mg qd with Metformin 1000 mg qd. Recommend scheduling annual eye exam. - CBC with Differential/Platelet; Future - Comprehensive metabolic panel; Future - Hemoglobin A1c; Future - Lipid panel; Future  3. HLD (hyperlipidemia) Tolerating Lipitor and trying to follow low fat diet. Will schedule labs in 2 weeks. - Comprehensive metabolic panel; Future - TSH; Future - Lipid panel; Future  4. Essential (primary) hypertension Stable and continues Norvasc 5 mg qd. Scheduled follow up labs in 2 weeks. - CBC with Differential/Platelet; Future - Comprehensive metabolic panel; Future - TSH; Future  5. Need for pneumococcal vaccination - Pneumococcal conjugate vaccine 13-valent IM  6. Screening PSA (prostate specific antigen) Denies dysuria, frequency or significant nocturia. Normal exam. Schedule PSA in 2 weeks. - PSA; Future  7. Colon cancer screening Stool negative for occult blood. Last colonoscopy was 07-23-04. Need to  consider scheduling follow up exam soon. - IFOBT POC (occult bld, rslt in office)    Vernie Murders, [facility]

## 2016-06-14 ENCOUNTER — Telehealth: Payer: Self-pay | Admitting: Family Medicine

## 2016-06-14 NOTE — Telephone Encounter (Signed)
Lab orders placed in chart as future orders. Patient only has to ask for printout at the front desk to get labs done down stairs.

## 2016-06-14 NOTE — Telephone Encounter (Signed)
Pt is requesting to pick up a lab slip to check yearly labs.  CB#[phone]

## 2016-06-20 ENCOUNTER — Other Ambulatory Visit: Payer: Self-pay

## 2016-06-20 DIAGNOSIS — I10 Essential (primary) hypertension: Secondary | ICD-10-CM

## 2016-06-20 DIAGNOSIS — E119 Type 2 diabetes mellitus without complications: Secondary | ICD-10-CM

## 2016-06-20 DIAGNOSIS — E785 Hyperlipidemia, unspecified: Secondary | ICD-10-CM

## 2016-06-20 DIAGNOSIS — Z125 Encounter for screening for malignant neoplasm of prostate: Secondary | ICD-10-CM

## 2016-06-21 ENCOUNTER — Telehealth: Payer: Self-pay

## 2016-06-21 LAB — CBC WITH DIFFERENTIAL/PLATELET
BASOS ABS: 0 10*3/uL (ref 0.0–0.2)
Basos: 0 %
EOS (ABSOLUTE): 0.1 10*3/uL (ref 0.0–0.4)
Eos: 2 %
HEMOGLOBIN: 15.1 g/dL (ref 12.6–17.7)
Hematocrit: 41.6 % (ref 37.5–51.0)
Immature Grans (Abs): 0 10*3/uL (ref 0.0–0.1)
Immature Granulocytes: 0 %
LYMPHS ABS: 1.4 10*3/uL (ref 0.7–3.1)
LYMPHS: 22 %
MCH: 32.5 pg (ref 26.6–33.0)
MCHC: 36.3 g/dL — AB (ref 31.5–35.7)
MCV: 90 fL (ref 79–97)
MONOCYTES: 9 %
Monocytes Absolute: 0.5 10*3/uL (ref 0.1–0.9)
NEUTROS ABS: 4.1 10*3/uL (ref 1.4–7.0)
Neutrophils: 67 %
PLATELETS: 238 10*3/uL (ref 150–379)
RBC: 4.64 x10E6/uL (ref 4.14–5.80)
RDW: 13.2 % (ref 12.3–15.4)
WBC: 6.1 10*3/uL (ref 3.4–10.8)

## 2016-06-21 LAB — COMPREHENSIVE METABOLIC PANEL
ALBUMIN: 4.6 g/dL (ref 3.6–4.8)
ALK PHOS: 63 IU/L (ref 39–117)
ALT: 21 IU/L (ref 0–44)
AST: 20 IU/L (ref 0–40)
Albumin/Globulin Ratio: 1.8 (ref 1.2–2.2)
BILIRUBIN TOTAL: 0.8 mg/dL (ref 0.0–1.2)
BUN/Creatinine Ratio: 14 (ref 10–24)
BUN: 14 mg/dL (ref 8–27)
CHLORIDE: 100 mmol/L (ref 96–106)
CO2: 23 mmol/L (ref 18–29)
CREATININE: 1.01 mg/dL (ref 0.76–1.27)
Calcium: 9.2 mg/dL (ref 8.6–10.2)
GFR calc Af Amer: 88 mL/min/{1.73_m2} (ref >59)
GFR calc non Af Amer: 76 mL/min/{1.73_m2} (ref >59)
GLUCOSE: 129 mg/dL — AB (ref 65–99)
Globulin, Total: 2.5 g/dL (ref 1.5–4.5)
Potassium: 4.4 mmol/L (ref 3.5–5.2)
Sodium: 138 mmol/L (ref 134–144)
Total Protein: 7.1 g/dL (ref 6.0–8.5)

## 2016-06-21 LAB — LIPID PANEL
CHOL/HDL RATIO: 2.2 ratio (ref 0.0–5.0)
Cholesterol, Total: 143 mg/dL (ref 100–199)
HDL: 66 mg/dL (ref >39)
LDL CALC: 61 mg/dL (ref 0–99)
TRIGLYCERIDES: 80 mg/dL (ref 0–149)
VLDL Cholesterol Cal: 16 mg/dL (ref 5–40)

## 2016-06-21 LAB — TSH: TSH: 2.99 u[IU]/mL (ref 0.450–4.500)

## 2016-06-21 LAB — HEMOGLOBIN A1C
ESTIMATED AVERAGE GLUCOSE: 154 mg/dL
HEMOGLOBIN A1C: 7 % — AB (ref 4.8–5.6)

## 2016-06-21 LAB — PSA: PROSTATE SPECIFIC AG, SERUM: 2.9 ng/mL (ref 0.0–4.0)

## 2016-06-21 NOTE — Telephone Encounter (Signed)
Advise patient we have some samples of Invokamet 100/1000 mg qd. By the online Medicare Formulary, looks as if this is covered as a tier 3 drug. Can try to send a prescription to any pharmacy he would like.

## 2016-06-21 NOTE — Telephone Encounter (Signed)
Patient advised.  Patient is requesting a call from [place]. Patient states he has some questions about his medications and some symptoms he is having. Patient would like to discuss this with Simona Huh.

## 2016-06-21 NOTE — Telephone Encounter (Signed)
-----   Message from Margo Common, [place] sent at 06/21/2016  8:37 AM EDT ----- Cholesterol in great shape. Blood sugar improved with Hgb A1C down to near goal of <7.0. All other tests essentially normal. Continue present medications and recheck diabetes in 6 months.

## 2016-06-21 NOTE — Telephone Encounter (Signed)
Patient advised.

## 2016-07-03 DIAGNOSIS — H5213 Myopia, bilateral: Secondary | ICD-10-CM | POA: Diagnosis not present

## 2016-07-15 ENCOUNTER — Ambulatory Visit (INDEPENDENT_AMBULATORY_CARE_PROVIDER_SITE_OTHER): Payer: Commercial Managed Care - HMO | Admitting: Family Medicine

## 2016-07-15 ENCOUNTER — Encounter: Payer: Self-pay | Admitting: Family Medicine

## 2016-07-15 VITALS — BP 122/78 | HR 58 | Temp 97.7°F | Resp 14 | Wt 250.6 lb

## 2016-07-15 DIAGNOSIS — R1013 Epigastric pain: Secondary | ICD-10-CM

## 2016-07-15 DIAGNOSIS — R1032 Left lower quadrant pain: Secondary | ICD-10-CM

## 2016-07-15 LAB — POCT URINALYSIS DIPSTICK
Bilirubin, UA: NEGATIVE
GLUCOSE UA: 2000
Ketones, UA: NEGATIVE
Leukocytes, UA: NEGATIVE
NITRITE UA: NEGATIVE
PH UA: 6
PROTEIN UA: NEGATIVE
RBC UA: NEGATIVE
SPEC GRAV UA: 1.025
UROBILINOGEN UA: 0.2

## 2016-07-15 MED ORDER — CIPROFLOXACIN HCL 500 MG PO TABS: 500.0000 mg | ORAL_TABLET | Freq: Two times a day (BID) | ORAL | 0 refills | Status: DC

## 2016-07-15 NOTE — Progress Notes (Signed)
Patient: Steven Miranda Male    DOB: 04-16-1946   70 y.o.   MRN: [id number] Visit Date: 07/15/2016  Today's Provider: Vernie Murders, PA   Chief Complaint  Patient presents with  . Abdominal Pain   Subjective:    Abdominal Pain  This is a new problem. Episode onset: Thursday. The onset quality is sudden. The problem occurs constantly. The problem has been gradually worsening. The pain is located in the suprapubic region. The quality of the pain is burning. Associated symptoms comments: Heartburn . He has tried nothing for the symptoms.   Patient Active Problem List   Diagnosis Date Noted  . Acid indigestion 02/02/2015  . Essential (primary) hypertension 02/02/2015  . Injury of tendon of rotator cuff 02/02/2015  . Diabetes mellitus, type 2 ([place]) 02/02/2015  . Diabetes ([place]) 10/25/2008  . HLD (hyperlipidemia) 05/19/2007   Past Surgical History:  Procedure Laterality Date  . STRABISMUS SURGERY Right    Family History  Problem Relation Age of Onset  . Cancer Mother   . Alcohol abuse Father   . Heart disease Maternal Uncle   . Rheumatic fever Maternal Uncle   . Diabetes Paternal Grandmother   . Emphysema Paternal Grandfather    No Known Allergies   Previous Medications   AMLODIPINE (NORVASC) 5 MG TABLET    Take 1 tablet (5 mg total) by mouth at bedtime.   ASPIRIN 81 MG TABLET    Take 1 tablet by mouth daily.   ATORVASTATIN (LIPITOR) 20 MG TABLET    Take 1 tablet (20 mg total) by mouth daily.   CANAGLIFLOZIN (INVOKANA) 100 MG TABS TABLET    Take 1 tablet (100 mg total) by mouth daily.   FERROUS SULFATE (IRON SUPPLEMENT PO)    Take 1 tablet by mouth daily.   FLUTICASONE (FLONASE) 50 MCG/ACT NASAL SPRAY    Place 2 sprays into the nose as needed.   GLUCOSE BLOOD (ACCU-CHEK SMARTVIEW) TEST STRIP    Use as instructed to check fasting blood sugar each morning.   LANCETS (ONETOUCH ULTRASOFT) LANCETS    Use as instructed once a day for fasting glucose check.   METFORMIN  (GLUCOPHAGE) 1000 MG TABLET    Take 1 tablet (1,000 mg total) by mouth daily.   MISC NATURAL PRODUCTS (OSTEO BI-FLEX ADV DOUBLE ST) CAPS    Take 1 capsule by mouth daily.    Review of Systems  Constitutional: Negative.   Respiratory: Negative.   Cardiovascular: Negative.   Gastrointestinal: Positive for abdominal pain.    Social History  Substance Use Topics  . Smoking status: Former Research scientist (life sciences)  . Smokeless tobacco: Never Used  . Alcohol use 0.6 - 1.2 oz/week    1 - 2 Standard drinks or equivalent per week   Objective:   BP 122/78 (BP Location: Right Arm, Patient Position: Sitting, Cuff Size: Large)   Pulse (!) 58   Temp 97.7 F (36.5 C) (Oral)   Resp 14   Wt 250 lb 9.6 oz (113.7 kg)   BMI 31.32 kg/m   Physical Exam  Constitutional: He is oriented to person, place, and time. He appears well-developed and well-nourished. No distress.  HENT:  Head: Normocephalic and atraumatic.  Right Ear: Hearing normal.  Left Ear: Hearing normal.  Nose: Nose normal.  Eyes: Conjunctivae and lids are normal. Right eye exhibits no discharge. Left eye exhibits no discharge. No scleral icterus.  Cardiovascular: Normal rate and regular rhythm.   Pulmonary/Chest: Effort normal and breath sounds normal. No  respiratory distress.  Abdominal: Soft. He exhibits no distension and no mass. There is tenderness. There is no rebound and no guarding.  LLQ discomfort. No rigidity or rebound.  Musculoskeletal: Normal range of motion.  Neurological: He is alert and oriented to person, place, and time.  Skin: Skin is intact. No lesion and no rash noted.  Psychiatric: He has a normal mood and affect. His speech is normal and behavior is normal. Thought content normal.      Assessment & Plan:     1. LLQ abdominal pain  Onset 3-4 days ago. Denies fever, nausea, vomiting, chills, hematochezia, dysuria or hematuria. Mild discomfort in LLQ and BS normal has had some loose stools. Urinalysis positive for large amount  of glucose due to Invokana for diabetes. No signs of infection or crystal. Will get labs to rule out infection in bowel. Start treatment for diverticulitis (antibiotic and low residue diet). Recheck pending lab tests. May need CT scan if no better in 2-3 days. - POCT urinalysis dipstick - CBC with Differential/Platelet - Comprehensive metabolic panel - ciprofloxacin (CIPRO) 500 MG tablet; Take 1 tablet (500 mg total) by mouth 2 (two) times daily.  Dispense: 20 tablet; Refill: 0  2. Dyspepsia Intermittent when lying down flat at night. No hematemesis or melena. May use Zantac 150 mg BID. No spicy or greasy foods. Recheck pending lab reports.

## 2016-07-15 NOTE — Patient Instructions (Signed)
Diverticulitis °Diverticulitis is inflammation or infection of small pouches in your colon that form when you have a condition called diverticulosis. The pouches in your colon are called diverticula. Your colon, or large intestine, is where water is absorbed and stool is formed. °Complications of diverticulitis can include: °· Bleeding. °· Severe infection. °· Severe pain. °· Perforation of your colon. °· Obstruction of your colon. °CAUSES  °Diverticulitis is caused by bacteria. °Diverticulitis happens when stool becomes trapped in diverticula. This allows bacteria to grow in the diverticula, which can lead to inflammation and infection. °RISK FACTORS °People with diverticulosis are at risk for diverticulitis. Eating a diet that does not include enough fiber from fruits and vegetables may make diverticulitis more likely to develop. °SYMPTOMS  °Symptoms of diverticulitis may include: °· Abdominal pain and tenderness. The pain is normally located on the left side of the abdomen, but may occur in other areas. °· Fever and chills. °· Bloating. °· Cramping. °· Nausea. °· Vomiting. °· Constipation. °· Diarrhea. °· Blood in your stool. °DIAGNOSIS  °Your health care provider will ask you about your medical history and do a physical exam. You may need to have tests done because many medical conditions can cause the same symptoms as diverticulitis. Tests may include: °· Blood tests. °· Urine tests. °· Imaging tests of the abdomen, including X-rays and CT scans. °When your condition is under control, your health care provider may recommend that you have a colonoscopy. A colonoscopy can show how severe your diverticula are and whether something else is causing your symptoms. °TREATMENT  °Most cases of diverticulitis are mild and can be treated at home. Treatment may include: °· Taking over-the-counter pain medicines. °· Following a clear liquid diet. °· Taking antibiotic medicines by mouth for 7-10 days. °More severe cases may  be treated at a hospital. Treatment may include: °· Not eating or drinking. °· Taking prescription pain medicine. °· Receiving antibiotic medicines through an IV tube. °· Receiving fluids and nutrition through an IV tube. °· Surgery. °HOME CARE INSTRUCTIONS  °· Follow your health care provider's instructions carefully. °· Follow a full liquid diet or other diet as directed by your health care provider. After your symptoms improve, your health care provider may tell you to change your diet. He or she may recommend you eat a high-fiber diet. Fruits and vegetables are good sources of fiber. Fiber makes it easier to pass stool. °· Take fiber supplements or probiotics as directed by your health care provider. °· Only take medicines as directed by your health care provider. °· Keep all your follow-up appointments. °SEEK MEDICAL CARE IF:  °· Your pain does not improve. °· You have a hard time eating food. °· Your bowel movements do not return to normal. °SEEK IMMEDIATE MEDICAL CARE IF:  °· Your pain becomes worse. °· Your symptoms do not get better. °· Your symptoms suddenly get worse. °· You have a fever. °· You have repeated vomiting. °· You have bloody or black, tarry stools. °MAKE SURE YOU:  °· Understand these instructions. °· Will watch your condition. °· Will get help right away if you are not doing well or get worse. °  °This information is not intended to replace advice given to you by your health care provider. Make sure you discuss any questions you have with your health care provider. °  °Document Released: 06/26/2005 Document Revised: 09/21/2013 Document Reviewed: 08/11/2013 °Elsevier Interactive Patient Education ©2016 Elsevier Inc. ° °

## 2016-07-16 LAB — CBC WITH DIFFERENTIAL/PLATELET
Basophils Absolute: 0 10*3/uL (ref 0.0–0.2)
Basos: 0 %
EOS (ABSOLUTE): 0 10*3/uL (ref 0.0–0.4)
EOS: 1 %
HEMATOCRIT: 42.5 % (ref 37.5–51.0)
HEMOGLOBIN: 15.1 g/dL (ref 12.6–17.7)
IMMATURE GRANS (ABS): 0 10*3/uL (ref 0.0–0.1)
Immature Granulocytes: 0 %
LYMPHS ABS: 1 10*3/uL (ref 0.7–3.1)
LYMPHS: 22 %
MCH: 32.9 pg (ref 26.6–33.0)
MCHC: 35.5 g/dL (ref 31.5–35.7)
MCV: 93 fL (ref 79–97)
MONOCYTES: 13 %
Monocytes Absolute: 0.6 10*3/uL (ref 0.1–0.9)
Neutrophils Absolute: 2.8 10*3/uL (ref 1.4–7.0)
Neutrophils: 64 %
Platelets: 180 10*3/uL (ref 150–379)
RBC: 4.59 x10E6/uL (ref 4.14–5.80)
RDW: 12.7 % (ref 12.3–15.4)
WBC: 4.4 10*3/uL (ref 3.4–10.8)

## 2016-07-16 LAB — COMPREHENSIVE METABOLIC PANEL
ALBUMIN: 4.6 g/dL (ref 3.6–4.8)
ALK PHOS: 75 IU/L (ref 39–117)
ALT: 24 IU/L (ref 0–44)
AST: 21 IU/L (ref 0–40)
Albumin/Globulin Ratio: 2 (ref 1.2–2.2)
BUN / CREAT RATIO: 16 (ref 10–24)
BUN: 16 mg/dL (ref 8–27)
Bilirubin Total: 0.7 mg/dL (ref 0.0–1.2)
CALCIUM: 9.1 mg/dL (ref 8.6–10.2)
CO2: 23 mmol/L (ref 18–29)
CREATININE: 0.97 mg/dL (ref 0.76–1.27)
Chloride: 96 mmol/L (ref 96–106)
GFR, EST AFRICAN AMERICAN: 92 mL/min/{1.73_m2} (ref >59)
GFR, EST NON AFRICAN AMERICAN: 80 mL/min/{1.73_m2} (ref >59)
GLOBULIN, TOTAL: 2.3 g/dL (ref 1.5–4.5)
GLUCOSE: 139 mg/dL — AB (ref 65–99)
Potassium: 4.5 mmol/L (ref 3.5–5.2)
SODIUM: 135 mmol/L (ref 134–144)
TOTAL PROTEIN: 6.9 g/dL (ref 6.0–8.5)

## 2016-07-17 ENCOUNTER — Telehealth: Payer: Self-pay | Admitting: Family Medicine

## 2016-07-17 NOTE — Telephone Encounter (Signed)
Pt called for test results.  Please call back at [phone]  Thank sTeri

## 2016-07-18 NOTE — Telephone Encounter (Signed)
See result note attached to lab reports.

## 2016-07-18 NOTE — Telephone Encounter (Signed)
-----   Message from Steven Miranda, [place] sent at 07/18/2016 10:41 AM EDT ----- Normal blood cell counts and chemistry except blood sugar slightly elevated. If antibiotics not clearing discomfort, should schedule CT scan of abdomen/pelvis with contrast to evaluate for diverticulitis.

## 2016-07-18 NOTE — Telephone Encounter (Signed)
Patient advised. Patient states symptoms have improved since starting the antibiotics. Patient will call back if he decides to proceed with CT scan.

## 2016-07-22 ENCOUNTER — Telehealth: Payer: Self-pay | Admitting: Family Medicine

## 2016-07-22 NOTE — Telephone Encounter (Signed)
Pt is requesting a call back drom Simona Huh to discuss having a CT scan of his lower abdomen due to stomach pain.  [phone]

## 2016-07-23 NOTE — Telephone Encounter (Signed)
Patient advised that Simona Huh is seeing patient this morning and would most likely get a callback later today. Patient denies any new or worsening symptoms and request to talk directly to [facility]. sd

## 2016-07-23 NOTE — Telephone Encounter (Signed)
Patient reported the LLQ abdominal discomfort has abated and he still has 2-3 days of Cipro to take. Recommend he finish it and may postpone CT scan unless he has recurrence. Patient understands and agrees with this plan.

## 2016-07-23 NOTE — Telephone Encounter (Signed)
Pt is calling again to request a call back from [place].  [id number]:[phone]

## 2016-07-30 ENCOUNTER — Telehealth: Payer: Self-pay | Admitting: Family Medicine

## 2016-07-30 DIAGNOSIS — R1032 Left lower quadrant pain: Secondary | ICD-10-CM

## 2016-07-30 NOTE — Telephone Encounter (Signed)
Advise patient to stop the Metformin the day before the CT scan and don't restart until 48 hours after test.

## 2016-07-30 NOTE — Telephone Encounter (Signed)
Abdomen was doing better after finishing he Cipro but had recurrence of mild LLQ soreness again now. Ready to proceed with CT scan of abdomen w/contrast and schedule GI referral. Back to normal BM pattern and consistency. No melena/hematochezia. Recommend increase in fluids in diet. Stop Metformin the day before contrast CT and don't restart until 48 hours after test.

## 2016-07-30 NOTE — Telephone Encounter (Signed)
Pt called saying he was just in a couple of weeks with diverticulitis.  He is still having a lot of issues and would like to talk to you about referring him to GI or what ever the next step is.  His call back is [phone]

## 2016-07-31 ENCOUNTER — Encounter: Payer: Self-pay | Admitting: General Surgery

## 2016-07-31 NOTE — Telephone Encounter (Signed)
Patient advised.

## 2016-08-09 ENCOUNTER — Telehealth: Payer: Self-pay

## 2016-08-09 ENCOUNTER — Ambulatory Visit
Admission: RE | Admit: 2016-08-09 | Discharge: 2016-08-09 | Disposition: A | Discharge status: Home / Self Care | Payer: Commercial Managed Care - HMO | Source: Ambulatory Visit | Attending: Family Medicine | Admitting: Family Medicine

## 2016-08-09 DIAGNOSIS — I700 Atherosclerosis of aorta: Secondary | ICD-10-CM | POA: Diagnosis not present

## 2016-08-09 DIAGNOSIS — R1032 Left lower quadrant pain: Secondary | ICD-10-CM

## 2016-08-09 DIAGNOSIS — R911 Solitary pulmonary nodule: Secondary | ICD-10-CM | POA: Insufficient documentation

## 2016-08-09 HISTORY — DX: Essential (primary) hypertension: I10

## 2016-08-09 HISTORY — DX: Type 2 diabetes mellitus without complications: E11.9

## 2016-08-09 MED ORDER — IOPAMIDOL (ISOVUE-300) INJECTION 61%
100.0000 mL | Freq: Once | INTRAVENOUS | Status: AC | PRN
  Administered 2016-08-09: 100 mL via INTRAVENOUS

## 2016-08-09 NOTE — Telephone Encounter (Signed)
Patient was advised of CT report he is very concerned about finding on CT report he request a call back right away from [place] at [phone]

## 2016-08-09 NOTE — Telephone Encounter (Signed)
-----   Message from [place], [place] sent at 08/09/2016  2:10 PM EST ----- No sign of diverticulitis or renal stone on CT scan. Incidental finding of a left lower lobe nodule in lung. Radiologist recommends Non-contrast chest CT. Proceed with left lower abdomen discomfort evaluation by Dr. Bary Castilla.

## 2016-08-12 ENCOUNTER — Ambulatory Visit (INDEPENDENT_AMBULATORY_CARE_PROVIDER_SITE_OTHER): Payer: Commercial Managed Care - HMO | Admitting: General Surgery

## 2016-08-12 ENCOUNTER — Encounter: Payer: Self-pay | Admitting: General Surgery

## 2016-08-12 VITALS — BP 124/68 | HR 74 | Resp 12 | Ht 75.0 in | Wt 252.0 lb

## 2016-08-12 DIAGNOSIS — R1032 Left lower quadrant pain: Secondary | ICD-10-CM

## 2016-08-12 MED ORDER — POLYETHYLENE GLYCOL 3350 17 GM/SCOOP PO POWD
ORAL | 0 refills | Status: DC
Start: 2016-08-12 — End: 2016-10-16

## 2016-08-12 NOTE — Patient Instructions (Signed)
Colonoscopy A colonoscopy is an exam to look at the entire large intestine (colon). This exam can help find problems such as tumors, polyps, inflammation, and areas of bleeding. The exam takes about 1 hour.  LET YOUR HEALTH CARE PROVIDER KNOW ABOUT:   Any allergies you have.  All medicines you are taking, including vitamins, herbs, eye drops, creams, and over-the-counter medicines.  Previous problems you or members of your family have had with the use of anesthetics.  Any blood disorders you have.  Previous surgeries you have had.  Medical conditions you have. RISKS AND COMPLICATIONS  Generally, this is a safe procedure. However, as with any procedure, complications can occur. Possible complications include:  Bleeding.  Tearing or rupture of the colon wall.  Reaction to medicines given during the exam.  Infection (rare). BEFORE THE PROCEDURE   Ask your health care provider about changing or stopping your regular medicines.  You may be prescribed an oral bowel prep. This involves drinking a large amount of medicated liquid, starting the day before your procedure. The liquid will cause you to have multiple loose stools until your stool is almost clear or light green. This cleans out your colon in preparation for the procedure.  Do not eat or drink anything else once you have started the bowel prep, unless your health care provider tells you it is safe to do so.  Arrange for someone to drive you home after the procedure. PROCEDURE   You will be given medicine to help you relax (sedative).  You will lie on your side with your knees bent.  A long, flexible tube with a light and camera on the end (colonoscope) will be inserted through the rectum and into the colon. The camera sends video back to a computer screen as it moves through the colon. The colonoscope also releases carbon dioxide gas to inflate the colon. This helps your health care provider see the area better.  During  the exam, your health care provider may take a small tissue sample (biopsy) to be examined under a microscope if any abnormalities are found.  The exam is finished when the entire colon has been viewed. AFTER THE PROCEDURE   Do not drive for 24 hours after the exam.  You may have a small amount of blood in your stool.  You may pass moderate amounts of gas and have mild abdominal cramping or bloating. This is caused by the gas used to inflate your colon during the exam.  Ask when your test results will be ready and how you will get your results. Make sure you get your test results.   This information is not intended to replace advice given to you by your health care provider. Make sure you discuss any questions you have with your health care provider.   Document Released: 09/13/2000 Document Revised: 07/07/2013 Document Reviewed: 05/24/2013 Elsevier Interactive Patient Education 2016 Elsevier Inc.  

## 2016-08-12 NOTE — Progress Notes (Signed)
Patient ID: Steven Miranda, male   DOB: 10-Mar-1946, 70 y.o.   MRN: [id number]  Chief Complaint  Patient presents with  . Abdominal Pain    HPI Steven Miranda is a 70 y.o. male here today for a evaluation of left lower quadrant pain. He states the pain Started about 3 weeks ago with a fairly sudden onset . He reported a burning sensation of the left lower quadrant that was exacerbated by eating. No particular food or better or worse than others. During this time he experienced no significant change in his regular daily bowel movement. No urinary symptoms. No vomiting, fever or chills. Patient was put of antibiotic for seven days.    The patient reported his pain began fairly suddenly on 07/11/2016. He was seen in his PCP office on October 18 and at that time was started on Cipro and Flagyl. He reported after taking the antibiotics he became a little bit constipated, but had no bleeding or mucus in the stools. In the last three days he has been felling better. Ct scan done on 08/09/2016.    The patient was accompanied today by his wife of 26 years, [name].   I personally reviewed the patient's history. HPI  Past Medical History:  Diagnosis Date  . Diabetes mellitus without complication ([place])   . Hypertension     Past Surgical History:  Procedure Laterality Date  . COLONOSCOPY  2005  . STRABISMUS SURGERY Right     Family History  Problem Relation Age of Onset  . Cancer Mother   . Alcohol abuse Father   . Heart disease Maternal Uncle   . Rheumatic fever Maternal Uncle   . Diabetes Paternal Grandmother   . Emphysema Paternal Grandfather     Social History Social History  Substance Use Topics  . Smoking status: Former Research scientist (life sciences)  . Smokeless tobacco: Never Used  . Alcohol use 0.6 - 1.2 oz/week    1 - 2 Standard drinks or equivalent per week    No Known Allergies  Current Outpatient Prescriptions  Medication Sig Dispense Refill  . amLODipine (NORVASC) 5 MG tablet Take 1 tablet  (5 mg total) by mouth at bedtime. 90 tablet 3  . aspirin 81 MG tablet Take 1 tablet by mouth daily.    Marland Kitchen atorvastatin (LIPITOR) 20 MG tablet Take 1 tablet (20 mg total) by mouth daily. 90 tablet 3  . Ferrous Sulfate (IRON SUPPLEMENT PO) Take 1 tablet by mouth daily.    . fluticasone (FLONASE) 50 MCG/ACT nasal spray Place 2 sprays into the nose as needed.    Marland Kitchen glucose blood (ACCU-CHEK SMARTVIEW) test strip Use as instructed to check fasting blood sugar each morning. 100 each 4  . Lancets (ONETOUCH ULTRASOFT) lancets Use as instructed once a day for fasting glucose check. 100 each 3  . metFORMIN (GLUCOPHAGE) 1000 MG tablet Take 1 tablet (1,000 mg total) by mouth daily. 90 tablet 3  . Misc Natural Products (OSTEO BI-FLEX ADV DOUBLE ST) CAPS Take 1 capsule by mouth daily.    . polyethylene glycol powder (GLYCOLAX/MIRALAX) powder 255 grams one bottle for colonoscopy prep 255 g 0   No current facility-administered medications for this visit.     Review of Systems Review of Systems  Constitutional: Negative.   Respiratory: Negative.   Cardiovascular: Negative.   Gastrointestinal: Negative.     Blood pressure 124/68, pulse 74, resp. rate 12, height 6\' 3"  (1.905 m), weight 252 lb (114.3 kg).  Physical Exam Physical Exam  Constitutional: He is oriented to person, place, and time. He appears well-developed and well-nourished.  Eyes: Conjunctivae are normal. No scleral icterus.  Neck: Neck supple.  Cardiovascular: Normal rate, regular rhythm and normal heart sounds.   Pulmonary/Chest: Effort normal and breath sounds normal.  Abdominal: Soft. Normal appearance and bowel sounds are normal. There is no hepatomegaly. There is no tenderness. A hernia is present. Ventral: small umbilical hernia.  Lymphadenopathy:    He has no cervical adenopathy.  Neurological: He is alert and oriented to person, place, and time.  Skin: Skin is warm and dry.    Data Reviewed CBC dated 07/17/2016 showed a  hemoglobin of hematocrit of 15.1 with an MCV of 93, white blood cell count 4400 with normal differential. Platelet count 180,000.  Comprehensive metabolic panel the same date showed a modest elevation of blood sugar 139, normal electrolytes, normal renal function with a creatinine of 0.97 and estimated GFR of 80. Normal liver function studies.  CT scan and report of the abdomen and pelvis dated 08/09/2016 was reviewed. Perhaps one or 2 diverticula in the distal sigmoid colon without evidence of diverticulitis. There is a general possible the of gas and distention of the entire left colon beginning at the splenic flexure and extending down to the rectum. No evidence of inflammation adjacent to the colon or evidence of colonic wall thickening in this area.  Incidentally noted was a small pulmonary nodule for which a follow-up was recommended at 6-12 months.  Assessment    Left lower quadrant pain, resolved. Unclear etiology.    Plan    The patient reported pain location left lower quadrant, resolution with oral antibiotics and present benign exam would point towards diverticulitis, and the CT scan was obtained also 3 weeks after the onset of his symptoms. Still it would be difficult to explain his exacerbation of the pain with eating based on diverticulitis alone, and the small 5 mm fascial defect of the umbilicus is not felt to be clinically relevant.  I've recommended that he had a colonoscopy as a screening exam.  Colonoscopy with possible biopsy/polypectomy prn: Information regarding the procedure, including its potential risks and complications (including but not limited to perforation of the bowel, which may require emergency surgery to repair, and bleeding) was verbally given to the patient. Educational information regarding lower intestinal endoscopy was given to the patient. Written instructions for how to complete the bowel prep using Miralax were provided. The importance of drinking ample  fluids to avoid dehydration as a result of the prep emphasized.  Patient has been scheduled for a colonoscopy on 10-16-16 at [facility]. This patient will need to hold Metformin day of colonoscopy prep and procedure. It is okay for patient to continue 81 mg aspirin once daily.   This information has been scribed by Gaspar Cola CMA.   Robert Bellow 08/12/2016, 9:42 PM

## 2016-09-10 ENCOUNTER — Other Ambulatory Visit: Payer: Self-pay | Admitting: Family Medicine

## 2016-09-10 ENCOUNTER — Telehealth: Payer: Self-pay | Admitting: Family Medicine

## 2016-09-10 DIAGNOSIS — I10 Essential (primary) hypertension: Secondary | ICD-10-CM

## 2016-09-10 DIAGNOSIS — E0800 Diabetes mellitus due to underlying condition with hyperosmolarity without nonketotic hyperglycemic-hyperosmolar coma (NKHHC): Secondary | ICD-10-CM

## 2016-09-10 DIAGNOSIS — E782 Mixed hyperlipidemia: Secondary | ICD-10-CM

## 2016-09-10 MED ORDER — ATORVASTATIN CALCIUM 20 MG PO TABS: 20.0000 mg | ORAL_TABLET | Freq: Every day | ORAL | 3 refills | Status: DC

## 2016-09-10 MED ORDER — METFORMIN HCL 1000 MG PO TABS: 1000.0000 mg | ORAL_TABLET | Freq: Two times a day (BID) | ORAL | 3 refills | Status: DC

## 2016-09-10 MED ORDER — AMLODIPINE BESYLATE 5 MG PO TABS: 5.0000 mg | ORAL_TABLET | Freq: Every day | ORAL | 3 refills | Status: DC

## 2016-09-10 NOTE — Telephone Encounter (Signed)
Advise patient refills of these medications were sent to [facility] today.

## 2016-09-10 NOTE — Telephone Encounter (Signed)
Pt contacted office for refill request on the following medications:  [facility] mail order.  [id number]  metFORMIN (GLUCOPHAGE) 1000 MG tablet-Pt states he is taking 2 pills a day  atorvastatin (LIPITOR) 20 MG tablet  amLODipine (NORVASC) 5 MG tablet

## 2016-09-14 NOTE — Telephone Encounter (Signed)
Sent one year refills on these three medications on 09-10-16 to [facility]. Chart indicates receipt. Check to be sure the refills were received.

## 2016-09-16 NOTE — Telephone Encounter (Signed)
Patient states [facility] did receive the refills and are in process of sending them to him.

## 2016-10-10 ENCOUNTER — Telehealth: Payer: Self-pay | Admitting: *Deleted

## 2016-10-10 NOTE — Telephone Encounter (Signed)
Patient was contacted today and confirms no medication changes since his last office visit.   This patient reports that he has picked up Miralax prescription.  We will proceed with colonoscopy as scheduled for 08-06-17 at [facility].   Patient was encouraged to call the office should he have further questions.   

## 2016-10-15 ENCOUNTER — Encounter: Payer: Self-pay | Admitting: *Deleted

## 2016-10-16 ENCOUNTER — Encounter: Payer: Self-pay | Admitting: *Deleted

## 2016-10-16 ENCOUNTER — Ambulatory Visit: Payer: Medicare HMO | Admitting: Anesthesiology

## 2016-10-16 ENCOUNTER — Ambulatory Visit
Admission: RE | Admit: 2016-10-16 | Discharge: 2016-10-16 | Disposition: A | Discharge status: Home / Self Care | Payer: Medicare HMO | Source: Ambulatory Visit | Attending: General Surgery | Admitting: General Surgery

## 2016-10-16 ENCOUNTER — Encounter
Admission: RE | Disposition: A | Discharge status: Home / Self Care | Payer: Self-pay | Source: Ambulatory Visit | Attending: General Surgery

## 2016-10-16 DIAGNOSIS — K635 Polyp of colon: Secondary | ICD-10-CM | POA: Diagnosis not present

## 2016-10-16 DIAGNOSIS — R1032 Left lower quadrant pain: Secondary | ICD-10-CM

## 2016-10-16 DIAGNOSIS — Z7982 Long term (current) use of aspirin: Secondary | ICD-10-CM | POA: Diagnosis not present

## 2016-10-16 DIAGNOSIS — Z7984 Long term (current) use of oral hypoglycemic drugs: Secondary | ICD-10-CM | POA: Diagnosis not present

## 2016-10-16 DIAGNOSIS — Z87891 Personal history of nicotine dependence: Secondary | ICD-10-CM | POA: Diagnosis not present

## 2016-10-16 DIAGNOSIS — I10 Essential (primary) hypertension: Secondary | ICD-10-CM | POA: Diagnosis not present

## 2016-10-16 DIAGNOSIS — Z79899 Other long term (current) drug therapy: Secondary | ICD-10-CM | POA: Insufficient documentation

## 2016-10-16 DIAGNOSIS — D125 Benign neoplasm of sigmoid colon: Secondary | ICD-10-CM | POA: Insufficient documentation

## 2016-10-16 DIAGNOSIS — K219 Gastro-esophageal reflux disease without esophagitis: Secondary | ICD-10-CM | POA: Insufficient documentation

## 2016-10-16 DIAGNOSIS — Z1211 Encounter for screening for malignant neoplasm of colon: Secondary | ICD-10-CM | POA: Insufficient documentation

## 2016-10-16 DIAGNOSIS — E119 Type 2 diabetes mellitus without complications: Secondary | ICD-10-CM | POA: Insufficient documentation

## 2016-10-16 HISTORY — PX: COLONOSCOPY WITH PROPOFOL: SHX5780

## 2016-10-16 SURGERY — COLONOSCOPY WITH PROPOFOL
Anesthesia: General

## 2016-10-16 MED ORDER — SODIUM CHLORIDE 0.9 % IV SOLN
INTRAVENOUS | Status: DC
  Administered 2016-10-16: 1000 mL via INTRAVENOUS
  Administered 2016-10-16: 10:00:00 via INTRAVENOUS

## 2016-10-16 MED ORDER — PROPOFOL 10 MG/ML IV BOLUS
INTRAVENOUS | Status: DC | PRN
  Administered 2016-10-16: 50 mg via INTRAVENOUS

## 2016-10-16 MED ORDER — FENTANYL CITRATE (PF) 100 MCG/2ML IJ SOLN
INTRAMUSCULAR | Status: DC | PRN
  Administered 2016-10-16: 50 ug via INTRAVENOUS

## 2016-10-16 MED ORDER — FENTANYL CITRATE (PF) 100 MCG/2ML IJ SOLN
INTRAMUSCULAR | Status: AC
  Filled 2016-10-16: qty 2

## 2016-10-16 MED ORDER — PROPOFOL 10 MG/ML IV BOLUS
INTRAVENOUS | Status: AC
  Filled 2016-10-16: qty 20

## 2016-10-16 MED ORDER — PROPOFOL 500 MG/50ML IV EMUL
INTRAVENOUS | Status: DC | PRN
  Administered 2016-10-16: 180 ug/kg/min via INTRAVENOUS

## 2016-10-16 MED ORDER — MIDAZOLAM HCL 2 MG/2ML IJ SOLN
INTRAMUSCULAR | Status: DC | PRN
  Administered 2016-10-16: 1 mg via INTRAVENOUS

## 2016-10-16 MED ORDER — LIDOCAINE HCL (PF) 2 % IJ SOLN
INTRAMUSCULAR | Status: AC
  Filled 2016-10-16: qty 2

## 2016-10-16 MED ORDER — MIDAZOLAM HCL 2 MG/2ML IJ SOLN
INTRAMUSCULAR | Status: AC
  Filled 2016-10-16: qty 2

## 2016-10-16 NOTE — Op Note (Addendum)
[facility] Gastroenterology Patient Name: Steven Miranda Procedure Date: 10/16/2016 10:18 AM MRN: [id number] Account #: [id number] Date of Birth: October 28, 1945 Admit Type: Outpatient Age: 71 Room: [facility] ENDO ROOM 1 Gender: Male Note Status: Finalized Procedure:            Colonoscopy Indications:          Screening for colorectal malignant neoplasm Providers:            Robert Bellow, MD Medicines:            Monitored Anesthesia Care Complications:        No immediate complications. Procedure:            Pre-Anesthesia Assessment:                       - Prior to the procedure, a History and Physical was                        performed, and patient medications, allergies and                        sensitivities were reviewed. The patient's tolerance of                        previous anesthesia was reviewed.                       - The risks and benefits of the procedure and the                        sedation options and risks were discussed with the                        patient. All questions were answered and informed                        consent was obtained.                       After obtaining informed consent, the colonoscope was                        passed under direct vision. Throughout the procedure,                        the patient's blood pressure, pulse, and oxygen                        saturations were monitored continuously. The Olympus                        CF-HQ190L Colonoscope (S#. [id number]) was introduced                        through the anus and advanced to the the cecum,                        identified by appendiceal orifice and ileocecal valve.                        The colonoscopy was performed without difficulty. The  patient tolerated the procedure well. The quality of                        the bowel preparation was excellent. Findings:      A 8 mm polyp was found in the sigmoid colon. The polyp was  sessile. The       polyp was removed with a hot snare. Resection was complete, but the       polyp tissue was not retrieved.      The retroflexed view of the distal rectum and anal verge was normal and       showed no anal or rectal abnormalities. Impression:           - One 8 mm polyp in the sigmoid colon, removed with a                        hot snare. Complete resection. Polyp tissue was                        retrieved.                       - The distal rectum and anal verge are normal on                        retroflexion view. Recommendation:       - Repeat colonoscopy in 5 years for surveillance. Procedure Code(s):    --- Professional ---                       [phone], Colonoscopy, flexible; with removal of tumor(s),                        polyp(s), or other lesion(s) by snare technique Diagnosis Code(s):    --- Professional ---                       D12.5, Benign neoplasm of sigmoid colon                       Z12.11, Encounter for screening for malignant neoplasm                        of colon CPT copyright 2016 American Medical Association. All rights reserved. The codes documented in this report are preliminary and upon coder review may  be revised to meet current compliance requirements. Robert Bellow, MD 10/16/2016 10:55:02 AM This report has been signed electronically. Number of Addenda: 0 Note Initiated On: 10/16/2016 10:18 AM Scope Withdrawal Time: 0 hours 15 minutes 23 seconds  Total Procedure Duration: 0 hours 20 minutes 58 seconds       [facility]

## 2016-10-16 NOTE — Anesthesia Preprocedure Evaluation (Signed)
Anesthesia Evaluation  Patient identified by MRN, date of birth, ID band Patient awake    Reviewed: Allergy & Precautions, H&P , NPO status , Patient's Chart, lab work & pertinent test results  History of Anesthesia Complications Negative for: history of anesthetic complications  Airway Mallampati: III  TM Distance: >3 FB Neck ROM: full    Dental  (+) Poor Dentition, Chipped, Caps   Pulmonary neg shortness of breath, former smoker,    Pulmonary exam normal breath sounds clear to auscultation       Cardiovascular Exercise Tolerance: Good hypertension, (-) angina(-) Past MI and (-) DOE Normal cardiovascular exam Rhythm:regular Rate:Normal     Neuro/Psych negative neurological ROS  negative psych ROS   GI/Hepatic Neg liver ROS, GERD  Controlled,  Endo/Other  diabetes, Type 2  Renal/GU negative Renal ROS  negative genitourinary   Musculoskeletal   Abdominal   Peds  Hematology negative hematology ROS (+)   Anesthesia Other Findings Amblyopia on right eye  Past Medical History: No date: Diabetes mellitus without complication ([facility]) No date: Hypertension  Past Surgical History: 2005: COLONOSCOPY No date: STRABISMUS SURGERY Right  BMI    Body Mass Index:  31.87 kg/m      Reproductive/Obstetrics negative OB ROS                             Anesthesia Physical Anesthesia Plan  ASA: III  Anesthesia Plan: General   Post-op Pain Management:    Induction:   Airway Management Planned:   Additional Equipment:   Intra-op Plan:   Post-operative Plan:   Informed Consent: I have reviewed the patients History and Physical, chart, labs and discussed the procedure including the risks, benefits and alternatives for the proposed anesthesia with the patient or authorized representative who has indicated his/her understanding and acceptance.   Dental Advisory Given  Plan Discussed with:  Anesthesiologist, CRNA and Surgeon  Anesthesia Plan Comments:         Anesthesia Quick Evaluation

## 2016-10-16 NOTE — Anesthesia Post-op Follow-up Note (Cosign Needed)
Anesthesia QCDR form completed.        

## 2016-10-16 NOTE — Anesthesia Postprocedure Evaluation (Signed)
Anesthesia Post Note  Patient: Steven Miranda  Procedure(s) Performed: Procedure(s) (LRB): COLONOSCOPY WITH PROPOFOL (N/A)  Patient location during evaluation: Endoscopy Anesthesia Type: General Level of consciousness: awake and alert Pain management: pain level controlled Vital Signs Assessment: post-procedure vital signs reviewed and stable Respiratory status: spontaneous breathing, nonlabored ventilation, respiratory function stable and patient connected to nasal cannula oxygen Cardiovascular status: blood pressure returned to baseline and stable Postop Assessment: no signs of nausea or vomiting Anesthetic complications: no     Last Vitals:  Vitals:   10/16/16 1110 10/16/16 1120  BP: 109/63 104/62  Pulse: 61 (!) 57  Resp: 13 14  Temp:      Last Pain:  Vitals:   10/16/16 1100  TempSrc: Tympanic                 Precious [place]

## 2016-10-16 NOTE — H&P (Signed)
Steven Miranda [id number] Nov 22, 1945     HPI: 71 y/o male seen in November with an episode of LLQ pain, now resolved. CT showed diverticuli without evidence of diverticulitis.  Tolerated prep well. For colonoscopy.   Prescriptions Prior to Admission  Medication Sig Dispense Refill Last Dose  . amLODipine (NORVASC) 5 MG tablet Take 1 tablet (5 mg total) by mouth at bedtime. 90 tablet 3   . aspirin 81 MG tablet Take 1 tablet by mouth daily.   Taking  . atorvastatin (LIPITOR) 20 MG tablet Take 1 tablet (20 mg total) by mouth daily. 90 tablet 3   . Ferrous Sulfate (IRON SUPPLEMENT PO) Take 1 tablet by mouth daily.   Taking  . fluticasone (FLONASE) 50 MCG/ACT nasal spray Place 2 sprays into the nose as needed.   Taking  . glucose blood (ACCU-CHEK SMARTVIEW) test strip Use as instructed to check fasting blood sugar each morning. 100 each 4 Taking  . Lancets (ONETOUCH ULTRASOFT) lancets Use as instructed once a day for fasting glucose check. 100 each 3 Taking  . metFORMIN (GLUCOPHAGE) 1000 MG tablet Take 1 tablet (1,000 mg total) by mouth 2 (two) times daily with a meal. 180 tablet 3   . Misc Natural Products (OSTEO BI-FLEX ADV DOUBLE ST) CAPS Take 1 capsule by mouth daily.   Taking  . polyethylene glycol powder (GLYCOLAX/MIRALAX) powder 255 grams one bottle for colonoscopy prep 255 g 0    No Known Allergies Past Medical History:  Diagnosis Date  . Diabetes mellitus without complication ([place])   . Hypertension    Past Surgical History:  Procedure Laterality Date  . COLONOSCOPY  2005  . STRABISMUS SURGERY Right    Social History   Social History  . Marital status: Married    Spouse name: N/A  . Number of children: N/A  . Years of education: N/A   Occupational History  . Not on file.   Social History Main Topics  . Smoking status: Former Research scientist (life sciences)  . Smokeless tobacco: Never Used  . Alcohol use 0.6 - 1.2 oz/week    1 - 2 Standard drinks or equivalent per week  . Drug use: No  .  Sexual activity: Not on file   Other Topics Concern  . Not on file   Social History Narrative  . No narrative on file   Social History   Social History Narrative  . No narrative on file     ROS: Negative.     PE: HEENT: Negative. Lungs: Clear. Cardio: RR. ABD: Soft, non-tender.   Assessment/Plan:  Proceed with planned endoscopy.    Robert Bellow 10/16/2016

## 2016-10-16 NOTE — Anesthesia Procedure Notes (Signed)
Date/Time: 10/16/2016 10:20 AM Performed by: Allean Found Pre-anesthesia Checklist: Emergency Drugs available, Suction available, Patient identified, Patient being monitored and Timeout performed Patient Re-evaluated:Patient Re-evaluated prior to inductionOxygen Delivery Method: Nasal cannula Intubation Type: IV induction

## 2016-10-16 NOTE — Transfer of Care (Signed)
Immediate Anesthesia Transfer of Care Note  Patient: Steven Miranda  Procedure(s) Performed: Procedure(s): COLONOSCOPY WITH PROPOFOL (N/A)  Patient Location: PACU  Anesthesia Type:General  Level of Consciousness: awake  Airway & Oxygen Therapy: Patient Spontanous Breathing and Patient connected to nasal cannula oxygen  Post-op Assessment: Report given to RN and Post -op Vital signs reviewed and stable  Post vital signs: Reviewed and stable  Last Vitals:  Vitals:   10/16/16 0945  BP: 126/86  Pulse: 74  Resp: 18  Temp: 36.2 C    Last Pain:  Vitals:   10/16/16 0945  TempSrc: Tympanic         Complications: No apparent anesthesia complications

## 2016-10-17 LAB — SURGICAL PATHOLOGY

## 2016-10-18 ENCOUNTER — Encounter: Payer: Self-pay | Admitting: General Surgery

## 2016-10-18 ENCOUNTER — Telehealth: Payer: Self-pay | Admitting: General Surgery

## 2016-10-18 NOTE — Telephone Encounter (Signed)
-----   Message from Robert Bellow, MD sent at 10/17/2016  5:56 PM EST ----- Please notify polyp without cancer.  Would recommend f/u exam in five years.  Please place in recalls. Thanks.  ----- Message ----- From: Interface, Lab In Three Zero One Sent: 10/17/2016  11:52 AM To: Robert Bellow, MD

## 2016-10-18 NOTE — Telephone Encounter (Signed)
I spoke with patient today and notified that polyp was benign.  Follow up in 5 years.

## 2016-10-29 ENCOUNTER — Other Ambulatory Visit: Payer: Self-pay | Admitting: Family Medicine

## 2016-10-29 NOTE — Telephone Encounter (Signed)
Last ov 07/15/16 Last filled 03/30/15. Please review. Thank you. sd

## 2016-12-31 ENCOUNTER — Other Ambulatory Visit: Payer: Self-pay

## 2016-12-31 ENCOUNTER — Other Ambulatory Visit: Payer: Self-pay | Admitting: Family Medicine

## 2016-12-31 ENCOUNTER — Telehealth: Payer: Self-pay | Admitting: Family Medicine

## 2016-12-31 DIAGNOSIS — E785 Hyperlipidemia, unspecified: Secondary | ICD-10-CM

## 2016-12-31 DIAGNOSIS — E119 Type 2 diabetes mellitus without complications: Secondary | ICD-10-CM

## 2016-12-31 DIAGNOSIS — I10 Essential (primary) hypertension: Secondary | ICD-10-CM

## 2016-12-31 DIAGNOSIS — E1169 Type 2 diabetes mellitus with other specified complication: Secondary | ICD-10-CM

## 2016-12-31 MED ORDER — ACCU-CHEK NANO SMARTVIEW W/DEVICE KIT: PACK | 0 refills | Status: AC

## 2016-12-31 NOTE — Telephone Encounter (Signed)
Patient states that his glucose meter is giving him a error code saying his test strips are no good but he states they are and he has tried more than one box to make sure.  He would like to get a new one.  He has a Psychologist, occupational.  He uses [employer] [facility].

## 2016-12-31 NOTE — Telephone Encounter (Signed)
Pt stated it is time for him to have his A1C checked and he would like to pick up a lab slip this afternoon if possible so he can go downstairs to have his lab done. Please advise. Thanks TNP

## 2016-12-31 NOTE — Telephone Encounter (Signed)
Labs put in as a future order to be printed when patient comes by for the requisition.

## 2016-12-31 NOTE — Telephone Encounter (Signed)
Sent order for the Accu-Chek Nano to [facility]. (Be sure batteries are good).

## 2016-12-31 NOTE — Telephone Encounter (Signed)
Lab slip printed at front desk for pick up. Left patient a voicemail advising him.

## 2017-01-01 DIAGNOSIS — E785 Hyperlipidemia, unspecified: Secondary | ICD-10-CM | POA: Diagnosis not present

## 2017-01-01 DIAGNOSIS — I10 Essential (primary) hypertension: Secondary | ICD-10-CM | POA: Diagnosis not present

## 2017-01-01 DIAGNOSIS — E119 Type 2 diabetes mellitus without complications: Secondary | ICD-10-CM | POA: Diagnosis not present

## 2017-01-01 DIAGNOSIS — E1169 Type 2 diabetes mellitus with other specified complication: Secondary | ICD-10-CM | POA: Diagnosis not present

## 2017-01-01 NOTE — Telephone Encounter (Signed)
Patient advised. Patient states he tried replacing the batteries and it still didn't work.

## 2017-01-02 LAB — CBC WITH DIFFERENTIAL/PLATELET
BASOS: 1 %
Basophils Absolute: 0 10*3/uL (ref 0.0–0.2)
EOS (ABSOLUTE): 0.2 10*3/uL (ref 0.0–0.4)
EOS: 4 %
Hematocrit: 39.8 % (ref 37.5–51.0)
Hemoglobin: 14.1 g/dL (ref 13.0–17.7)
IMMATURE GRANS (ABS): 0 10*3/uL (ref 0.0–0.1)
IMMATURE GRANULOCYTES: 0 %
LYMPHS: 28 %
Lymphocytes Absolute: 1.2 10*3/uL (ref 0.7–3.1)
MCH: 32.4 pg (ref 26.6–33.0)
MCHC: 35.4 g/dL (ref 31.5–35.7)
MCV: 92 fL (ref 79–97)
MONOS ABS: 0.4 10*3/uL (ref 0.1–0.9)
Monocytes: 10 %
NEUTROS PCT: 57 %
Neutrophils Absolute: 2.6 10*3/uL (ref 1.4–7.0)
PLATELETS: 223 10*3/uL (ref 150–379)
RBC: 4.35 x10E6/uL (ref 4.14–5.80)
RDW: 13.2 % (ref 12.3–15.4)
WBC: 4.4 10*3/uL (ref 3.4–10.8)

## 2017-01-02 LAB — TSH: TSH: 2.97 u[IU]/mL (ref 0.450–4.500)

## 2017-01-02 LAB — COMPREHENSIVE METABOLIC PANEL
ALT: 21 IU/L (ref 0–44)
AST: 17 IU/L (ref 0–40)
Albumin/Globulin Ratio: 2 (ref 1.2–2.2)
Albumin: 4.5 g/dL (ref 3.6–4.8)
Alkaline Phosphatase: 68 IU/L (ref 39–117)
BUN/Creatinine Ratio: 11 (ref 10–24)
BUN: 11 mg/dL (ref 8–27)
Bilirubin Total: 0.8 mg/dL (ref 0.0–1.2)
CALCIUM: 9.2 mg/dL (ref 8.6–10.2)
CO2: 25 mmol/L (ref 18–29)
CREATININE: 1.03 mg/dL (ref 0.76–1.27)
Chloride: 101 mmol/L (ref 96–106)
GFR, EST AFRICAN AMERICAN: 85 mL/min/{1.73_m2} (ref >59)
GFR, EST NON AFRICAN AMERICAN: 74 mL/min/{1.73_m2} (ref >59)
Globulin, Total: 2.2 g/dL (ref 1.5–4.5)
Glucose: 194 mg/dL — ABNORMAL HIGH (ref 65–99)
Potassium: 4.9 mmol/L (ref 3.5–5.2)
Sodium: 141 mmol/L (ref 134–144)
TOTAL PROTEIN: 6.7 g/dL (ref 6.0–8.5)

## 2017-01-02 LAB — LIPID PANEL
CHOLESTEROL TOTAL: 149 mg/dL (ref 100–199)
Chol/HDL Ratio: 2.7 ratio (ref 0.0–5.0)
HDL: 55 mg/dL (ref >39)
LDL CALC: 78 mg/dL (ref 0–99)
Triglycerides: 79 mg/dL (ref 0–149)
VLDL CHOLESTEROL CAL: 16 mg/dL (ref 5–40)

## 2017-01-02 LAB — HEMOGLOBIN A1C
ESTIMATED AVERAGE GLUCOSE: 186 mg/dL
Hgb A1c MFr Bld: 8.1 % — ABNORMAL HIGH (ref 4.8–5.6)

## 2017-01-14 DIAGNOSIS — Z1283 Encounter for screening for malignant neoplasm of skin: Secondary | ICD-10-CM | POA: Diagnosis not present

## 2017-01-14 DIAGNOSIS — D1800 Hemangioma unspecified site: Secondary | ICD-10-CM | POA: Diagnosis not present

## 2017-01-14 DIAGNOSIS — L821 Other seborrheic keratosis: Secondary | ICD-10-CM | POA: Diagnosis not present

## 2017-01-14 DIAGNOSIS — L853 Xerosis cutis: Secondary | ICD-10-CM | POA: Diagnosis not present

## 2017-01-14 DIAGNOSIS — L720 Epidermal cyst: Secondary | ICD-10-CM | POA: Diagnosis not present

## 2017-01-14 DIAGNOSIS — D229 Melanocytic nevi, unspecified: Secondary | ICD-10-CM | POA: Diagnosis not present

## 2017-01-14 DIAGNOSIS — L578 Other skin changes due to chronic exposure to nonionizing radiation: Secondary | ICD-10-CM | POA: Diagnosis not present

## 2017-01-14 DIAGNOSIS — L570 Actinic keratosis: Secondary | ICD-10-CM | POA: Diagnosis not present

## 2017-01-14 DIAGNOSIS — L820 Inflamed seborrheic keratosis: Secondary | ICD-10-CM | POA: Diagnosis not present

## 2017-02-03 ENCOUNTER — Encounter: Payer: Self-pay | Admitting: Family Medicine

## 2017-02-03 ENCOUNTER — Ambulatory Visit (INDEPENDENT_AMBULATORY_CARE_PROVIDER_SITE_OTHER): Payer: Medicare HMO | Admitting: Family Medicine

## 2017-02-03 VITALS — BP 122/80 | HR 57 | Temp 97.6°F | Wt 261.6 lb

## 2017-02-03 DIAGNOSIS — E785 Hyperlipidemia, unspecified: Secondary | ICD-10-CM

## 2017-02-03 DIAGNOSIS — E1100 Type 2 diabetes mellitus with hyperosmolarity without nonketotic hyperglycemic-hyperosmolar coma (NKHHC): Secondary | ICD-10-CM | POA: Diagnosis not present

## 2017-02-03 DIAGNOSIS — I10 Essential (primary) hypertension: Secondary | ICD-10-CM

## 2017-02-03 NOTE — Progress Notes (Signed)
Patient: Steven Miranda Male    DOB: 1946-01-20   71 y.o.   MRN: [id number] Visit Date: 02/03/2017  Today's Provider: Vernie Murders, PA   Chief Complaint  Patient presents with  . Diabetes  . Hyperlipidemia  . Hypertension  . Follow-up   Subjective:    HPI  Diabetes Mellitus Type II, Follow-up:   Lab Results  Component Value Date   HGBA1C 8.1 (H) 01/01/2017   HGBA1C 7.0 (H) 06/20/2016   HGBA1C 8.3 (H) 08/03/2015    Last seen for diabetes on 05/10/2016 Management since then includes continue medications. Follow up within 1 month to adjust medications He reports good compliance with treatment. He is not having side effects.  Current symptoms include none Home blood sugar records: fasting range: 180-240's. This morning 160  Episodes of hypoglycemia? no   Current Insulin Regimen: none Weight trend: stable Current diet: well balanced Current exercise: gym and running. Recently started back  Pertinent Labs:    Component Value Date/Time   CHOL 149 01/01/2017 0901   TRIG 79 01/01/2017 0901   HDL 55 01/01/2017 0901   LDLCALC 78 01/01/2017 0901   CREATININE 1.03 01/01/2017 0901    Wt Readings from Last 3 Encounters:  02/03/17 261 lb 9.6 oz (118.7 kg)  10/16/16 255 lb (115.7 kg)  08/12/16 252 lb (114.3 kg)    ------------------------------------------------------------------------  Hypertension, follow-up:  BP Readings from Last 3 Encounters:  02/03/17 122/80  10/16/16 104/62  08/12/16 124/68    He was last seen for hypertension on 05/10/2016  BP at that visit was 120/76. Management since that visit includes continue medications.He reports good compliance with treatment. He is not having side effects.  He is exercising. He is adherent to low salt diet.   Outside blood pressures are not being checked. He is experiencing none.  Patient denies chest pain, chest pressure/discomfort, irregular heart beat and palpitations.   Cardiovascular risk factors  include advanced age (older than 75 for men, 36 for women), diabetes mellitus, dyslipidemia, hypertension, male gender and obesity (BMI >= 30 kg/m2).  Use of agents associated with hypertension: none.   ------------------------------------------------------------------------    Lipid/Cholesterol, Follow-up:   Last seen for this on 05/10/2016  Management since that visit includes continue medications, diet and exercise.  Last Lipid Panel:    Component Value Date/Time   CHOL 149 01/01/2017 0901   TRIG 79 01/01/2017 0901   HDL 55 01/01/2017 0901   CHOLHDL 2.7 01/01/2017 0901   LDLCALC 78 01/01/2017 0901    He reports good compliance with treatment. He is not having side effects.   Wt Readings from Last 3 Encounters:  02/03/17 261 lb 9.6 oz (118.7 kg)  10/16/16 255 lb (115.7 kg)  08/12/16 252 lb (114.3 kg)    ------------------------------------------------------------------------ Past Medical History:  Diagnosis Date  . Diabetes mellitus without complication ([place])   . Hypertension    Past Surgical History:  Procedure Laterality Date  . COLONOSCOPY  2005  . COLONOSCOPY WITH PROPOFOL N/A 10/16/2016   Procedure: COLONOSCOPY WITH PROPOFOL;  Surgeon: Robert Bellow, MD;  Location: [facility] ENDOSCOPY;  Service: Endoscopy;  Laterality: N/A;  . STRABISMUS SURGERY Right    Family History  Problem Relation Age of Onset  . Cancer Mother   . Alcohol abuse Father   . Heart disease Maternal Uncle   . Rheumatic fever Maternal Uncle   . Diabetes Paternal Grandmother   . Emphysema Paternal Grandfather    No Known Allergies   Previous Medications  ACCU-CHEK SMARTVIEW TEST STRIP    CHECK  FASTING  BLOOD  SUGAR EVERY MORNING AS INSTRUCTED   AMLODIPINE (NORVASC) 5 MG TABLET    Take 1 tablet (5 mg total) by mouth at bedtime.   ASPIRIN 81 MG TABLET    Take 1 tablet by mouth daily.   ATORVASTATIN (LIPITOR) 20 MG TABLET    Take 1 tablet (20 mg total) by mouth daily.   BLOOD GLUCOSE  MONITORING SUPPL (ACCU-CHEK NANO SMARTVIEW) W/DEVICE KIT    Use glucometer (Accu-Chek Nano Glucometer) to test fasting glucose daily.   FERROUS SULFATE (IRON SUPPLEMENT PO)    Take 1 tablet by mouth daily.   FLUTICASONE (FLONASE) 50 MCG/ACT NASAL SPRAY    Place 2 sprays into the nose as needed.   LANCETS (ONETOUCH ULTRASOFT) LANCETS    Use as instructed once a day for fasting glucose check.   METFORMIN (GLUCOPHAGE) 1000 MG TABLET    Take 1 tablet (1,000 mg total) by mouth 2 (two) times daily with a meal.   MISC NATURAL PRODUCTS (OSTEO BI-FLEX ADV DOUBLE ST) CAPS    Take 1 capsule by mouth daily.    Review of Systems  Social History  Substance Use Topics  . Smoking status: Former Research scientist (life sciences)  . Smokeless tobacco: Never Used  . Alcohol use 0.6 - 1.2 oz/week    1 - 2 Standard drinks or equivalent per week   Objective:   BP 122/80 (BP Location: Right Arm, Patient Position: Sitting, Cuff Size: Large)   Pulse (!) 57   Temp 97.6 F (36.4 C) (Oral)   Wt 261 lb 9.6 oz (118.7 kg)   SpO2 95%   BMI 32.70 kg/m   Physical Exam  Constitutional: He is oriented to person, place, and time. He appears well-developed and well-nourished. No distress.  HENT:  Head: Normocephalic and atraumatic.  Right Ear: Hearing and external ear normal.  Left Ear: Hearing and external ear normal.  Nose: Nose normal.  Mouth/Throat: Oropharynx is clear and moist.  Eyes: Conjunctivae and lids are normal. Right eye exhibits no discharge. Left eye exhibits no discharge. No scleral icterus.  Strabismus right eye - concentrates vision out of the left eye.  Neck: Neck supple.  Cardiovascular: Normal rate and regular rhythm.   Pulmonary/Chest: Breath sounds normal. No respiratory distress.  Abdominal: Soft. Bowel sounds are normal.  Musculoskeletal: Normal range of motion.  Lymphadenopathy:    He has cervical adenopathy.  Neurological: He is alert and oriented to person, place, and time. He has normal reflexes.  Skin:  Skin is intact. No lesion and no rash noted.  Psychiatric: He has a normal mood and affect. His speech is normal and behavior is normal. Thought content normal.      Assessment & Plan:     1. Type 2 diabetes mellitus with hyperosmolarity without coma, without long-term current use of insulin (HCC) Still taking Metformin 1000 mg BID. Was afraid to take the Invokana due to possible side effects he read about. Had a flare of arthritis in the left knee and this stopped him from exercising for a few weeks. Gained 6 lbs since January 2018. Has gotten back on his diet and scheduled ophthalmology exam in July 2018. No sign of neuropathy on foot exam today. Labs on 01-01-17 showed Hgb A1C was 8.1%. Wants to work on exercise, diet and continue Metformin before trying any additional medications. FBS 160 at home today. Recent Results (from the past 2160 hour(s))  Lipid panel  Status: None   Collection Time: 01/01/17  9:01 AM  Result Value Ref Range   Cholesterol, Total 149 100 - 199 mg/dL   Triglycerides 79 0 - 149 mg/dL   HDL 55 >39 mg/dL   VLDL Cholesterol Cal 16 5 - 40 mg/dL   LDL Calculated 78 0 - 99 mg/dL   Chol/HDL Ratio 2.7 0.0 - 5.0 ratio    Comment:                                   T. Chol/HDL Ratio                                             Men  Women                               1/2 Avg.Risk  3.4    3.3                                   Avg.Risk  5.0    4.4                                2X Avg.Risk  9.6    7.1                                3X Avg.Risk 23.4   11.0   CBC with Differential/Platelet     Status: None   Collection Time: 01/01/17  9:01 AM  Result Value Ref Range   WBC 4.4 3.4 - 10.8 x10E3/uL   RBC 4.35 4.14 - 5.80 x10E6/uL   Hemoglobin 14.1 13.0 - 17.7 g/dL   Hematocrit 39.8 37.5 - 51.0 %   MCV 92 79 - 97 fL   MCH 32.4 26.6 - 33.0 pg   MCHC 35.4 31.5 - 35.7 g/dL   RDW 13.2 12.3 - 15.4 %   Platelets 223 150 - 379 x10E3/uL   Neutrophils 57 Not Estab. %   Lymphs 28  Not Estab. %   Monocytes 10 Not Estab. %   Eos 4 Not Estab. %   Basos 1 Not Estab. %   Neutrophils Absolute 2.6 1.4 - 7.0 x10E3/uL   Lymphocytes Absolute 1.2 0.7 - 3.1 x10E3/uL   Monocytes Absolute 0.4 0.1 - 0.9 x10E3/uL   EOS (ABSOLUTE) 0.2 0.0 - 0.4 x10E3/uL   Basophils Absolute 0.0 0.0 - 0.2 x10E3/uL   Immature Granulocytes 0 Not Estab. %   Immature Grans (Abs) 0.0 0.0 - 0.1 x10E3/uL  Comprehensive metabolic panel     Status: Abnormal   Collection Time: 01/01/17  9:01 AM  Result Value Ref Range   Glucose 194 (H) 65 - 99 mg/dL   BUN 11 8 - 27 mg/dL   Creatinine, Ser 1.03 0.76 - 1.27 mg/dL   GFR calc non Af Amer 74 >59 mL/min/1.73   GFR calc Af Amer 85 >59 mL/min/1.73   BUN/Creatinine Ratio 11 10 - 24   Sodium 141 134 - 144 mmol/L   Potassium 4.9 3.5 - 5.2 mmol/L   Chloride 101  96 - 106 mmol/L   CO2 25 18 - 29 mmol/L   Calcium 9.2 8.6 - 10.2 mg/dL   Total Protein 6.7 6.0 - 8.5 g/dL   Albumin 4.5 3.6 - 4.8 g/dL   Globulin, Total 2.2 1.5 - 4.5 g/dL   Albumin/Globulin Ratio 2.0 1.2 - 2.2   Bilirubin Total 0.8 0.0 - 1.2 mg/dL   Alkaline Phosphatase 68 39 - 117 IU/L   AST 17 0 - 40 IU/L   ALT 21 0 - 44 IU/L  Hemoglobin A1c     Status: Abnormal   Collection Time: 01/01/17  9:01 AM  Result Value Ref Range   Hgb A1c MFr Bld 8.1 (H) 4.8 - 5.6 %    Comment:          Pre-diabetes: 5.7 - 6.4          Diabetes: >6.4          Glycemic control for adults with diabetes: <7.0    Est. average glucose Bld gHb Est-mCnc 186 mg/dL  TSH     Status: None   Collection Time: 01/01/17  9:01 AM  Result Value Ref Range   TSH 2.970 0.450 - 4.500 uIU/mL    2. Essential (primary) hypertension Well controlled and stable on the Amlodipine 5 mg qd. No edema, palpitations, chest discomfort or dyspnea. Continue sodium restriction and present regimen. Recheck in 3 months.  3. Hyperlipidemia, unspecified hyperlipidemia type Tolerating Atorvastatin 20 mg qd without side effects. Continue low fat diet  and restart exercise program. Lipids in very good shape from labs drawn on 01-01-17 (see above). Recheck in 3 months.

## 2017-03-29 ENCOUNTER — Ambulatory Visit (INDEPENDENT_AMBULATORY_CARE_PROVIDER_SITE_OTHER): Payer: Medicare HMO | Admitting: Physician Assistant

## 2017-03-29 ENCOUNTER — Encounter: Payer: Self-pay | Admitting: Physician Assistant

## 2017-03-29 VITALS — BP 120/60 | HR 80 | Temp 98.9°F | Resp 16 | Wt 259.2 lb

## 2017-03-29 DIAGNOSIS — R350 Frequency of micturition: Secondary | ICD-10-CM

## 2017-03-29 DIAGNOSIS — R509 Fever, unspecified: Secondary | ICD-10-CM

## 2017-03-29 DIAGNOSIS — R81 Glycosuria: Secondary | ICD-10-CM | POA: Diagnosis not present

## 2017-03-29 DIAGNOSIS — E119 Type 2 diabetes mellitus without complications: Secondary | ICD-10-CM

## 2017-03-29 DIAGNOSIS — J020 Streptococcal pharyngitis: Secondary | ICD-10-CM

## 2017-03-29 DIAGNOSIS — J029 Acute pharyngitis, unspecified: Secondary | ICD-10-CM

## 2017-03-29 LAB — POCT URINALYSIS DIPSTICK
Bilirubin, UA: NEGATIVE
Glucose, UA: 250
KETONES UA: 40
Leukocytes, UA: NEGATIVE
Nitrite, UA: NEGATIVE
PH UA: 7.5 (ref 5.0–8.0)
SPEC GRAV UA: 1.01 (ref 1.010–1.025)
Urobilinogen, UA: 0.2 E.U./dL

## 2017-03-29 LAB — POCT GLYCOSYLATED HEMOGLOBIN (HGB A1C)
Est. average glucose Bld gHb Est-mCnc: 203
Hemoglobin A1C: 8.7

## 2017-03-29 LAB — POCT RAPID STREP A (OFFICE): RAPID STREP A SCREEN: POSITIVE — AB

## 2017-03-29 MED ORDER — AMOXICILLIN 875 MG PO TABS: 875.0000 mg | ORAL_TABLET | Freq: Two times a day (BID) | ORAL | 0 refills | Status: DC

## 2017-03-29 NOTE — Patient Instructions (Signed)
Strep Throat Strep throat is a bacterial infection of the throat. Your health care provider may call the infection tonsillitis or pharyngitis, depending on whether there is swelling in the tonsils or at the back of the throat. Strep throat is most common during the cold months of the year in children who are 5-69 years of age, but it can happen during any season in people of any age. This infection is spread from person to person (contagious) through coughing, sneezing, or close contact. What are the causes? Strep throat is caused by the bacteria called Streptococcus pyogenes. What increases the risk? This condition is more likely to develop in:  People who spend time in crowded places where the infection can spread easily.  People who have close contact with someone who has strep throat.  What are the signs or symptoms? Symptoms of this condition include:  Fever or chills.  Redness, swelling, or pain in the tonsils or throat.  Pain or difficulty when swallowing.  White or yellow spots on the tonsils or throat.  Swollen, tender glands in the neck or under the jaw.  Red rash all over the body (rare).  How is this diagnosed? This condition is diagnosed by performing a rapid strep test or by taking a swab of your throat (throat culture test). Results from a rapid strep test are usually ready in a few minutes, but throat culture test results are available after one or two days. How is this treated? This condition is treated with antibiotic medicine. Follow these instructions at home: Medicines  Take over-the-counter and prescription medicines only as told by your health care provider.  Take your antibiotic as told by your health care provider. Do not stop taking the antibiotic even if you start to feel better.  Have family members who also have a sore throat or fever tested for strep throat. They may need antibiotics if they have the strep infection. Eating and drinking  Do not  share food, drinking cups, or personal items that could cause the infection to spread to other people.  If swallowing is difficult, try eating soft foods until your sore throat feels better.  Drink enough fluid to keep your urine clear or pale yellow. General instructions  Gargle with a salt-water mixture 3-4 times per day or as needed. To make a salt-water mixture, completely dissolve -1 tsp of salt in 1 cup of warm water.  Make sure that all household members wash their hands well.  Get plenty of rest.  Stay home from school or work until you have been taking antibiotics for 24 hours.  Keep all follow-up visits as told by your health care provider. This is important. Contact a health care provider if:  The glands in your neck continue to get bigger.  You develop a rash, cough, or earache.  You cough up a thick liquid that is green, yellow-brown, or bloody.  You have pain or discomfort that does not get better with medicine.  Your problems seem to be getting worse rather than better.  You have a fever. Get help right away if:  You have new symptoms, such as vomiting, severe headache, stiff or painful neck, chest pain, or shortness of breath.  You have severe throat pain, drooling, or changes in your voice.  You have swelling of the neck, or the skin on the neck becomes red and tender.  You have signs of dehydration, such as fatigue, dry mouth, and decreased urination.  You become increasingly sleepy, or   you cannot wake up completely.  Your joints become red or painful. This information is not intended to replace advice given to you by your health care provider. Make sure you discuss any questions you have with your health care provider. Document Released: 09/13/2000 Document Revised: 05/15/2016 Document Reviewed: 01/09/2015 Elsevier Interactive Patient Education  2017 Elsevier Inc.  

## 2017-03-29 NOTE — Progress Notes (Signed)
     Patient: Steven Miranda Male    DOB: 08/05/1946   71 y.o.   MRN: [id number] Visit Date: 03/29/2017  Today's Provider: Jennifer M Burnette, PA-C   Chief Complaint  Patient presents with  . URI   Subjective:    URI   This is a new problem. The current episode started yesterday. The problem has been unchanged. The maximum temperature recorded prior to his arrival was 100.4 - 100.9 F. Associated symptoms include coughing, ear pain (Right ear pain) and a sore throat. Pertinent negatives include no abdominal pain, chest pain, congestion, diarrhea, dysuria, headaches, rhinorrhea, sinus pain, vomiting or wheezing. He has tried NSAIDs (Ibuprofen last night) for the symptoms. The treatment provided no relief.  He was exposed to strep by his grandson last week. He is also reporting increased urination that started yesterday as well. He denies any burning, pain or foul odor. He states that since yesterday he has been urinating every 1-2 hours, even with a decreased intake due to having the sore throat.    No Known Allergies   Current Outpatient Prescriptions:  .  ACCU-CHEK SMARTVIEW test strip, CHECK  FASTING  BLOOD  SUGAR EVERY MORNING AS INSTRUCTED, Disp: 100 each, Rfl: 4 .  amLODipine (NORVASC) 5 MG tablet, Take 1 tablet (5 mg total) by mouth at bedtime., Disp: 90 tablet, Rfl: 3 .  aspirin 81 MG tablet, Take 1 tablet by mouth daily., Disp: , Rfl:  .  atorvastatin (LIPITOR) 20 MG tablet, Take 1 tablet (20 mg total) by mouth daily., Disp: 90 tablet, Rfl: 3 .  Blood Glucose Monitoring Suppl (ACCU-CHEK NANO SMARTVIEW) w/Device KIT, Use glucometer (Accu-Chek Nano Glucometer) to test fasting glucose daily., Disp: 1 kit, Rfl: 0 .  Ferrous Sulfate (IRON SUPPLEMENT PO), Take 1 tablet by mouth daily., Disp: , Rfl:  .  Lancets (ONETOUCH ULTRASOFT) lancets, Use as instructed once a day for fasting glucose check., Disp: 100 each, Rfl: 3 .  metFORMIN (GLUCOPHAGE) 1000 MG tablet, Take 1 tablet (1,000 mg  total) by mouth 2 (two) times daily with a meal., Disp: 180 tablet, Rfl: 3 .  Misc Natural Products (OSTEO BI-FLEX ADV DOUBLE ST) CAPS, Take 1 capsule by mouth daily., Disp: , Rfl:  .  fluticasone (FLONASE) 50 MCG/ACT nasal spray, Place 2 sprays into the nose as needed., Disp: , Rfl:   Review of Systems  Constitutional: Positive for chills, fatigue and fever (99-100).  HENT: Positive for ear pain (Right ear pain), sore throat, trouble swallowing and voice change. Negative for congestion, postnasal drip, rhinorrhea, sinus pain and sinus pressure.   Respiratory: Positive for cough. Negative for chest tightness, shortness of breath and wheezing.   Cardiovascular: Negative for chest pain, palpitations and leg swelling.  Gastrointestinal: Negative for abdominal pain, diarrhea and vomiting.  Genitourinary: Positive for frequency (reports going to the bathroom evry 1 to 2 hour, started yesterday). Negative for discharge, dysuria, flank pain, hematuria, penile pain, penile swelling, scrotal swelling, testicular pain and urgency.  Musculoskeletal: Negative for back pain.  Neurological: Negative for headaches.    Social History  Substance Use Topics  . Smoking status: Former Smoker  . Smokeless tobacco: Never Used  . Alcohol use 0.6 - 1.2 oz/week    1 - 2 Standard drinks or equivalent per week   Objective:   BP 120/60 (BP Location: Right Arm, Patient Position: Sitting, Cuff Size: Normal)   Pulse 80   Temp 98.9 F (37.2 C) (Oral)   Resp 16     Wt 259 lb 3.2 oz (117.6 kg)   BMI 32.40 kg/m    Physical Exam  Constitutional: He appears well-developed and well-nourished. No distress.  HENT:  Head: Normocephalic and atraumatic.  Right Ear: Hearing, tympanic membrane, external ear and ear canal normal. Tympanic membrane is not erythematous and not bulging. No middle ear effusion.  Left Ear: Hearing, tympanic membrane, external ear and ear canal normal. Tympanic membrane is not erythematous and not  bulging.  No middle ear effusion.  Nose: Mucosal edema and rhinorrhea present. Right sinus exhibits no maxillary sinus tenderness and no frontal sinus tenderness. Left sinus exhibits no maxillary sinus tenderness and no frontal sinus tenderness.  Mouth/Throat: Uvula is midline and mucous membranes are normal. Posterior oropharyngeal edema and posterior oropharyngeal erythema present. No oropharyngeal exudate or tonsillar abscesses.  Eyes: Conjunctivae and EOM are normal. Pupils are equal, round, and reactive to light. Right eye exhibits no discharge. Left eye exhibits no discharge.  Neck: Normal range of motion. Neck supple. No tracheal deviation present. No Brudzinski's sign and no Kernig's sign noted. No thyromegaly present.  Cardiovascular: Normal rate, regular rhythm and normal heart sounds.  Exam reveals no gallop and no friction rub.   No murmur heard. Pulmonary/Chest: Effort normal and breath sounds normal. No stridor. No respiratory distress. He has no wheezes. He has no rales.  Abdominal: Soft. Bowel sounds are normal. There is no tenderness. There is no CVA tenderness.  Lymphadenopathy:    He has no cervical adenopathy.  Skin: Skin is warm and dry. He is not diaphoretic.  Vitals reviewed.      Assessment & Plan:     1. Strep pharyngitis Rapid strep was positive. Will treat with amoxil as below. Advised to use Tylenol prn for fevers and body aches. Salt water gargles for inflammation. May use chloraseptic spray as needed. Call if symptoms do not improve. - amoxicillin (AMOXIL) 875 MG tablet; Take 1 tablet (875 mg total) by mouth 2 (two) times daily.  Dispense: 20 tablet; Refill: 0  2. Sore throat - POCT rapid strep A  3. Fever, unspecified fever cause - POCT rapid strep A  4. Urine frequency UA was positive for glucosuria and some proteinuria. A1c was checked due to glucosuria to see if diabetes was worsening. A1c was up to 8.7 from 8.1. Suspect increase secondary to bacterial  infection. Will see if improvements occur with antibiotic. If he continues to have urine frequency I advised him to return sooner than August to speak with Vernie Murders, PA-C (PCP) about other medication options for better control of his sugar. He voiced understanding. - POCT Urinalysis Dipstick  5. Glucosuria See above medical treatment plan. - POCT HgB A1C  6. Type 2 diabetes mellitus without complication, without long-term current use of insulin (HCC) A1c up to 8.7. - POCT HgB A1C       Mar Daring, PA-C  [place]

## 2017-04-09 ENCOUNTER — Encounter: Payer: Self-pay | Admitting: Family Medicine

## 2017-04-09 ENCOUNTER — Ambulatory Visit (INDEPENDENT_AMBULATORY_CARE_PROVIDER_SITE_OTHER): Payer: Medicare HMO | Admitting: Family Medicine

## 2017-04-09 VITALS — BP 134/80 | HR 70 | Temp 98.2°F | Resp 16 | Wt 254.8 lb

## 2017-04-09 DIAGNOSIS — R300 Dysuria: Secondary | ICD-10-CM

## 2017-04-09 LAB — POCT URINALYSIS DIPSTICK
Bilirubin, UA: NEGATIVE
Blood, UA: NEGATIVE
Glucose, UA: NEGATIVE
Ketones, UA: NEGATIVE
Leukocytes, UA: NEGATIVE
Nitrite, UA: NEGATIVE
Spec Grav, UA: <=1.005 — AB (ref 1.010–1.025)
Urobilinogen, UA: 1 E.U./dL
pH, UA: 6.5 (ref 5.0–8.0)

## 2017-04-09 MED ORDER — LEVOFLOXACIN 500 MG PO TABS: 500.0000 mg | ORAL_TABLET | Freq: Every day | ORAL | 0 refills | Status: DC

## 2017-04-09 NOTE — Patient Instructions (Signed)
Let us know if not improving. 

## 2017-04-09 NOTE — Progress Notes (Signed)
Subjective:     Patient ID: Steven Miranda, male   DOB: 21-May-1946, 71 y.o.   MRN: [id number]  HPI  Chief Complaint  Patient presents with  . Dysuria    Patient comes in office today with complaints of dysuria and frequency for the past 48hrs. Patient states that he was recently seen in office and treated for strep, patient states that once he started antibiotic he began to notice dysuria intermittent.   States diabetes has not been under optimal control with one medication. Denies hx of UTI or BPH. Last A1c 8.7 on 03/29/17.   Review of Systems     Objective:   Physical Exam  Constitutional: He appears well-developed and well-nourished. No distress.  Genitourinary:  Genitourinary Comments: Prostate firm, non-tender, minimal enlargemetn       Assessment:    1. Dysuria - levofloxacin (LEVAQUIN) 500 MG tablet; Take 1 tablet (500 mg total) by mouth daily.  Dispense: 10 tablet; Refill: 0 - POCT urinalysis dipstick    Plan:    He will call if not improving. Has f/u for diabetes in August.

## 2017-04-29 ENCOUNTER — Ambulatory Visit (INDEPENDENT_AMBULATORY_CARE_PROVIDER_SITE_OTHER): Payer: Medicare HMO

## 2017-04-29 VITALS — BP 134/82 | HR 60 | Temp 97.9°F | Ht 75.0 in | Wt 259.8 lb

## 2017-04-29 DIAGNOSIS — Z0000 Encounter for general adult medical examination without abnormal findings: Secondary | ICD-10-CM

## 2017-04-29 NOTE — Progress Notes (Signed)
Subjective:   Steven Miranda is a 71 y.o. male who presents for Medicare Annual/Subsequent preventive examination.  Review of Systems:  N/A  Cardiac Risk Factors include: advanced age (>12mn, >>6women);diabetes mellitus;dyslipidemia;hypertension;male gender;obesity (BMI >30kg/m2)     Objective:    Vitals: BP 134/82 (BP Location: Left Arm)   Pulse 60   Temp 97.9 F (36.6 C) (Oral)   Ht _0  (1.905 m)   Wt 259 lb 12.8 oz (117.8 kg)   BMI 32.47 kg/m   Body mass index is 32.47 kg/m.  Tobacco History  Smoking Status  . Former Smoker  . Types: Cigarettes  Smokeless Tobacco  . Never Used    Comment: quit in 1978     Counseling given: Not Answered   Past Medical History:  Diagnosis Date  . Diabetes mellitus without complication ([place]   . Dysuria   . Hypertension    Past Surgical History:  Procedure Laterality Date  . COLONOSCOPY  2005  . COLONOSCOPY WITH PROPOFOL N/A 10/16/2016   Procedure: COLONOSCOPY WITH PROPOFOL;  Surgeon: JRobert Bellow MD;  [facility];  Service: Endoscopy;  Laterality: N/A;  . STRABISMUS SURGERY Right    Family History  Problem Relation Age of Onset  . Cancer Mother   . Alcohol abuse Father   . Heart disease Maternal Uncle   . Rheumatic fever Maternal Uncle   . Diabetes Paternal Grandmother   . Emphysema Paternal Grandfather    History  Sexual Activity  . Sexual activity: Not on file    Outpatient Encounter Prescriptions as of 04/29/2017  Medication Sig  . ACCU-CHEK SMARTVIEW test strip CHECK  FASTING  BLOOD  SUGAR EVERY MORNING AS INSTRUCTED  . amLODipine (NORVASC) 5 MG tablet Take 1 tablet (5 mg total) by mouth at bedtime.  .Marland Kitchenaspirin 81 MG tablet Take 1 tablet by mouth daily.  .Marland Kitchenatorvastatin (LIPITOR) 20 MG tablet Take 1 tablet (20 mg total) by mouth daily.  . Blood Glucose Monitoring Suppl (ACCU-CHEK NANO SMARTVIEW) w/Device KIT Use glucometer (Accu-Chek Nano Glucometer) to test fasting glucose daily.  .  Ferrous Sulfate (IRON SUPPLEMENT PO) Take 1 tablet by mouth daily.  . Lancets (ONETOUCH ULTRASOFT) lancets Use as instructed once a day for fasting glucose check.  . metFORMIN (GLUCOPHAGE) 1000 MG tablet Take 1 tablet (1,000 mg total) by mouth 2 (two) times daily with a meal.  . Misc Natural Products (OSTEO BI-FLEX ADV DOUBLE ST) CAPS Take 1 capsule by mouth daily.  . Probiotic Product (PROBIOTIC FORMULA) CAPS Take by mouth daily.  . [DISCONTINUED] levofloxacin (LEVAQUIN) 500 MG tablet Take 1 tablet (500 mg total) by mouth daily.   No facility-administered encounter medications on file as of 04/29/2017.     Activities of Daily Living In your present state of health, do you have any difficulty performing the following activities: 04/29/2017 05/10/2016  Hearing? N N  Vision? N N  Difficulty concentrating or making decisions? N N  Walking or climbing stairs? Y N  Dressing or bathing? N N  Doing errands, shopping? N N  Preparing Food and eating ? N -  Using the Toilet? N -  In the past six months, have you accidently leaked urine? N -  Do you have problems with loss of bowel control? N -  Managing your Medications? N -  Managing your Finances? N -  Housekeeping or managing your Housekeeping? N -  Some recent data might be hidden    Patient Care Team: Chrism[place],  Vickki Muff, PA as PCP - General (Physician Assistant) Robert Bellow, MD (General Surgery)   Assessment:     Exercise Activities and Dietary recommendations Current Exercise Habits: Structured exercise class;Home exercise routine, Time (Minutes): 60 (to 1.5 hours), Frequency (Times/Week): 4, Weekly Exercise (Minutes/Week): 240, Intensity: Moderate, Exercise limited by: None identified  Goals    . Reduce portion size          Recommend decreasing portion sizes and eating 3 meals a day and 2 healthy snacks in between.       Fall Risk Fall Risk  04/29/2017 05/10/2016 03/30/2015  Falls in the past year? No No No   Depression  Screen PHQ 2/9 Scores 04/29/2017 05/10/2016 03/30/2015  PHQ - 2 Score 0 0 0    Cognitive Function: Pt declined screening today.        Immunization History  Administered Date(s) Administered  . Hepatitis A 02/02/2004  . Influenza, High Dose Seasonal PF 07/08/2014  . Influenza,inj,Quad PF,36+ Mos 07/23/2013  . Influenza-Unspecified 06/15/2015  . Pneumococcal Conjugate-13 05/10/2016  . Tdap 08/22/2009   Screening Tests Health Maintenance  Topic Date Due  . OPHTHALMOLOGY EXAM  02/03/2018 (Originally 06/25/2016)  . URINE MICROALBUMIN  02/03/2018 (Originally 10/09/1955)  . Hepatitis C Screening  02/03/2018 (Originally 1946-05-11)  . INFLUENZA VACCINE  04/30/2017  . PNA vac Low Risk Adult (2 of 2 - PPSV23) 05/10/2017  . HEMOGLOBIN A1C  09/28/2017  . FOOT EXAM  02/03/2018  . TETANUS/TDAP  08/23/2019  . COLONOSCOPY  10/16/2026      Plan:  I have personally reviewed and addressed the Medicare Annual Wellness questionnaire and have noted the following in the patient's chart:  A. Medical and social history B. Use of alcohol, tobacco or illicit drugs  C. Current medications and supplements D. Functional ability and status E.  Nutritional status F.  Physical activity G. Advance directives H. List of other physicians I.  Hospitalizations, surgeries, and ER visits in previous 12 months J.  [place] such as hearing and vision if needed, cognitive and depression L. Referrals and appointments - none  In addition, I have reviewed and discussed with patient certain preventive protocols, quality metrics, and best practice recommendations. A written personalized care plan for preventive services as well as general preventive health recommendations were provided to patient.  See attached scanned questionnaire for additional information.   Signed,  Fabio Neighbors, LPN Nurse Health Advisor   MD Recommendations: Pt needs a urine microalbumin screening at next OV. Pt declined  Hepatitis C screening today and has an eye exam scheduled for 05/2017.

## 2017-04-29 NOTE — Patient Instructions (Signed)
Steven Miranda , Thank you for taking time to come for your Medicare Wellness Visit. I appreciate your ongoing commitment to your health goals. Please review the following plan we discussed and let me know if I can assist you in the future.   Screening recommendations/referrals: Colonoscopy: completed 10/16/16, due 09/2021 Recommended yearly ophthalmology/optometry visit for glaucoma screening and checkup Recommended yearly dental visit for hygiene and checkup  Vaccinations: Influenza vaccine: due 05/2017 Pneumococcal vaccine: Prevnar 13 given 05/10/16, Pneumovax 23 due 05/10/17 Tdap vaccine: completed 08/22/09, due 08/2019 Shingles vaccine: declined    Advanced directives: Please bring a copy of your POA (Power of [place]) and/or Living Will to your next appointment.   Conditions/risks identified: Obesity; Recommend decreasing portion sizes and eating 3 meals a day and 2 healthy snacks in between.   Next appointment: 05/06/17 @ 8:30 AM  Preventive Care 65 Years and Older, Male Preventive care refers to lifestyle choices and visits with your health care provider that can promote health and wellness. What does preventive care include?  A yearly physical exam. This is also called an annual well check.  Dental exams once or twice a year.  Routine eye exams. Ask your health care provider how often you should have your eyes checked.  Personal lifestyle choices, including:  Daily care of your teeth and gums.  Regular physical activity.  Eating a healthy diet.  Avoiding tobacco and drug use.  Limiting alcohol use.  Practicing safe sex.  Taking low doses of aspirin every day.  Taking vitamin and mineral supplements as recommended by your health care provider. What happens during an annual well check? The services and screenings done by your health care provider during your annual well check will depend on your age, overall health, lifestyle risk factors, and family history of  disease. Counseling  Your health care provider may ask you questions about your:  Alcohol use.  Tobacco use.  Drug use.  Emotional well-being.  Home and relationship well-being.  Sexual activity.  Eating habits.  History of falls.  Memory and ability to understand (cognition).  Work and work Statistician. Screening  You may have the following tests or measurements:  Height, weight, and BMI.  Blood pressure.  Lipid and cholesterol levels. These may be checked every 5 years, or more frequently if you are over 65 years old.  Skin check.  Lung cancer screening. You may have this screening every year starting at age 64 if you have a 30-pack-year history of smoking and currently smoke or have quit within the past 15 years.  Fecal occult blood test (FOBT) of the stool. You may have this test every year starting at age 23.  Flexible sigmoidoscopy or colonoscopy. You may have a sigmoidoscopy every 5 years or a colonoscopy every 10 years starting at age 36.  Prostate cancer screening. Recommendations will vary depending on your family history and other risks.  Hepatitis C blood test.  Hepatitis B blood test.  Sexually transmitted disease (STD) testing.  Diabetes screening. This is done by checking your blood sugar (glucose) after you have not eaten for a while (fasting). You may have this done every 1-3 years.  Abdominal aortic aneurysm (AAA) screening. You may need this if you are a current or former smoker.  Osteoporosis. You may be screened starting at age 28 if you are at high risk. Talk with your health care provider about your test results, treatment options, and if necessary, the need for more tests. Vaccines  Your health care  provider may recommend certain vaccines, such as:  Influenza vaccine. This is recommended every year.  Tetanus, diphtheria, and acellular pertussis (Tdap, Td) vaccine. You may need a Td booster every 10 years.  Zoster vaccine. You may  need this after age 60.  Pneumococcal 13-valent conjugate (PCV13) vaccine. One dose is recommended after age 1.  Pneumococcal polysaccharide (PPSV23) vaccine. One dose is recommended after age 37. Talk to your health care provider about which screenings and vaccines you need and how often you need them. This information is not intended to replace advice given to you by your health care provider. Make sure you discuss any questions you have with your health care provider. Document Released: 10/13/2015 Document Revised: 06/05/2016 Document Reviewed: 07/18/2015 Elsevier Interactive Patient Education  2017 [place] Prevention in the Home Falls can cause injuries. They can happen to people of all ages. There are many things you can do to make your home safe and to help prevent falls. What can I do on the outside of my home?  Regularly fix the edges of walkways and driveways and fix any cracks.  Remove anything that might make you trip as you walk through a door, such as a raised step or threshold.  Trim any bushes or trees on the path to your home.  Use bright outdoor lighting.  Clear any walking paths of anything that might make someone trip, such as rocks or tools.  Regularly check to see if handrails are loose or broken. Make sure that both sides of any steps have handrails.  Any raised decks and porches should have guardrails on the edges.  Have any leaves, snow, or ice cleared regularly.  Use sand or salt on walking paths during winter.  Clean up any spills in your garage right away. This includes oil or grease spills. What can I do in the bathroom?  Use night lights.  Install grab bars by the toilet and in the tub and shower. Do not use towel bars as grab bars.  Use non-skid mats or decals in the tub or shower.  If you need to sit down in the shower, use a plastic, non-slip stool.  Keep the floor dry. Clean up any water that spills on the floor as soon as it  happens.  Remove soap buildup in the tub or shower regularly.  Attach bath mats securely with double-sided non-slip rug tape.  Do not have throw rugs and other things on the floor that can make you trip. What can I do in the bedroom?  Use night lights.  Make sure that you have a light by your bed that is easy to reach.  Do not use any sheets or blankets that are too big for your bed. They should not hang down onto the floor.  Have a firm chair that has side arms. You can use this for support while you get dressed.  Do not have throw rugs and other things on the floor that can make you trip. What can I do in the kitchen?  Clean up any spills right away.  Avoid walking on wet floors.  Keep items that you use a lot in easy-to-reach places.  If you need to reach something above you, use a strong step stool that has a grab bar.  Keep electrical cords out of the way.  Do not use floor polish or wax that makes floors slippery. If you must use wax, use non-skid floor wax.  Do not have throw  rugs and other things on the floor that can make you trip. What can I do with my stairs?  Do not leave any items on the stairs.  Make sure that there are handrails on both sides of the stairs and use them. Fix handrails that are broken or loose. Make sure that handrails are as long as the stairways.  Check any carpeting to make sure that it is firmly attached to the stairs. Fix any carpet that is loose or worn.  Avoid having throw rugs at the top or bottom of the stairs. If you do have throw rugs, attach them to the floor with carpet tape.  Make sure that you have a light switch at the top of the stairs and the bottom of the stairs. If you do not have them, ask someone to add them for you. What else can I do to help prevent falls?  Wear shoes that:  Do not have high heels.  Have rubber bottoms.  Are comfortable and fit you well.  Are closed at the toe. Do not wear sandals.  If you  use a stepladder:  Make sure that it is fully opened. Do not climb a closed stepladder.  Make sure that both sides of the stepladder are locked into place.  Ask someone to hold it for you, if possible.  Clearly mark and make sure that you can see:  Any grab bars or handrails.  First and last steps.  Where the edge of each step is.  Use tools that help you move around (mobility aids) if they are needed. These include:  Canes.  Walkers.  Scooters.  Crutches.  Turn on the lights when you go into a dark area. Replace any light bulbs as soon as they burn out.  Set up your furniture so you have a clear path. Avoid moving your furniture around.  If any of your floors are uneven, fix them.  If there are any pets around you, be aware of where they are.  Review your medicines with your doctor. Some medicines can make you feel dizzy. This can increase your chance of falling. Ask your doctor what other things that you can do to help prevent falls. This information is not intended to replace advice given to you by your health care provider. Make sure you discuss any questions you have with your health care provider. Document Released: 07/13/2009 Document Revised: 02/22/2016 Document Reviewed: 10/21/2014 Elsevier Interactive Patient Education  2017 [employer].

## 2017-05-06 ENCOUNTER — Encounter: Payer: Self-pay | Admitting: Family Medicine

## 2017-05-06 ENCOUNTER — Ambulatory Visit (INDEPENDENT_AMBULATORY_CARE_PROVIDER_SITE_OTHER): Payer: Medicare HMO | Admitting: Family Medicine

## 2017-05-06 VITALS — BP 138/76 | HR 59 | Temp 97.6°F | Resp 14 | Wt 257.8 lb

## 2017-05-06 DIAGNOSIS — E119 Type 2 diabetes mellitus without complications: Secondary | ICD-10-CM | POA: Diagnosis not present

## 2017-05-06 LAB — POCT UA - MICROALBUMIN: MICROALBUMIN (UR) POC: 20 mg/L

## 2017-05-06 MED ORDER — EMPAGLIFLOZIN 10 MG PO TABS: 10.0000 mg | ORAL_TABLET | Freq: Every day | ORAL | 0 refills | Status: DC

## 2017-05-06 NOTE — Progress Notes (Signed)
Steven Miranda  MRN: [id number] DOB: 1946-09-22  Subjective:  HPI   Patient is here for 3 months follow up. Last office visit was on 02/03/17. DM: patient is checking his sugar once daily in the morning, fasting, readings vary from 170s-250s. No hypoglycemic episodes or symptoms. No numbness or tingling sensation present. Patient has eye exam scheduled for September 2018 with [facility]. He is taking Metformin.  Lab Results  Component Value Date   HGBA1C 8.7 03/29/2017   HTN: patient is not checking his b/p at home. Denies any chest pain or tightness.  BP Readings from Last 3 Encounters:  05/06/17 138/76  04/29/17 134/82  04/09/17 134/80   Wt Readings from Last 3 Encounters:  05/06/17 257 lb 12.8 oz (116.9 kg)  04/29/17 259 lb 12.8 oz (117.8 kg)  04/09/17 254 lb 12.8 oz (115.6 kg)   Past Surgical History:  Procedure Laterality Date  . COLONOSCOPY  2005  . COLONOSCOPY WITH PROPOFOL N/A 10/16/2016   Procedure: COLONOSCOPY WITH PROPOFOL;  Surgeon: Robert Bellow, MD;  Location: [facility];  Service: Endoscopy;  Laterality: N/A;  . STRABISMUS SURGERY Right     Patient Active Problem List   Diagnosis Date Noted  . Abdominal pain, left lower quadrant 08/12/2016  . Acid indigestion 02/02/2015  . Essential (primary) hypertension 02/02/2015  . Injury of tendon of rotator cuff 02/02/2015  . Diabetes mellitus, type 2 ([place]) 02/02/2015  . HLD (hyperlipidemia) 05/19/2007    Past Medical History:  Diagnosis Date  . Diabetes mellitus without complication ([place])   . Dysuria   . Hypertension     Social History   Social History  . Marital status: Married    Spouse name: N/A  . Number of children: N/A  . Years of education: N/A   Occupational History  . Not on file.   Social History Main Topics  . Smoking status: Former Smoker    Types: Cigarettes  . Smokeless tobacco: Never Used     Comment: quit in 1978  . Alcohol use 3.0 - 3.6 oz/week    1 - 2 Standard drinks  or equivalent, 4 Cans of beer per week  . Drug use: No  . Sexual activity: Not on file   Other Topics Concern  . Not on file   Social History Narrative  . No narrative on file    Outpatient Encounter Prescriptions as of 05/06/2017  Medication Sig Note  . ACCU-CHEK SMARTVIEW test strip CHECK  FASTING  BLOOD  SUGAR EVERY MORNING AS INSTRUCTED   . amLODipine (NORVASC) 5 MG tablet Take 1 tablet (5 mg total) by mouth at bedtime.   Marland Kitchen aspirin 81 MG tablet Take 1 tablet by mouth daily. 02/02/2015: Received from: [place]:   . atorvastatin (LIPITOR) 20 MG tablet Take 1 tablet (20 mg total) by mouth daily.   . Blood Glucose Monitoring Suppl (ACCU-CHEK NANO SMARTVIEW) w/Device KIT Use glucometer (Accu-Chek Nano Glucometer) to test fasting glucose daily.   . Ferrous Sulfate (IRON SUPPLEMENT PO) Take 1 tablet by mouth daily.   . Lancets (ONETOUCH ULTRASOFT) lancets Use as instructed once a day for fasting glucose check.   . metFORMIN (GLUCOPHAGE) 1000 MG tablet Take 1 tablet (1,000 mg total) by mouth 2 (two) times daily with a meal.   . Misc Natural Products (OSTEO BI-FLEX ADV DOUBLE ST) CAPS Take 1 capsule by mouth daily. 02/02/2015: Received from: [place]:   . Probiotic Product (PROBIOTIC FORMULA)  CAPS Take by mouth daily.    No facility-administered encounter medications on file as of 05/06/2017.     No Known Allergies  Review of Systems  Constitutional: Negative.   Respiratory: Negative.   Cardiovascular: Negative.   Gastrointestinal: Negative.   Musculoskeletal: Negative.   Neurological: Negative.     Objective:  BP 138/76   Pulse (!) 59   Temp 97.6 F (36.4 C)   Resp 14   Wt 257 lb 12.8 oz (116.9 kg)   BMI 32.22 kg/m   Physical Exam  Constitutional: He is oriented to person, place, and time and well-developed, well-nourished, and in no distress.  HENT:  Head: Normocephalic.  Eyes: Conjunctivae are normal.  Neck:  Neck supple.  Cardiovascular: Normal rate and regular rhythm.   Pulmonary/Chest: Effort normal and breath sounds normal.  Musculoskeletal: Normal range of motion.  Neurological: He is alert and oriented to person, place, and time.  Psychiatric: Affect and judgment normal.    Assessment and Plan :   1. Type 2 diabetes mellitus without complication, without long-term current use of insulin (HCC) FBS has been 170's to 230 this summer. Still taking Meftormin 1000 mg BID and tolerating it well. Weight down 2 lbs since 04-29-17 when Hgb A1C was 8.7%. Will give samples of Jardiance to use daily and continue FBS daily. Increase exercise level and watch diet. Recheck CBC and CMP. Microalbumin level 20. May need to consider ACE if it climbs or renal function shows strain. Recheck FBS readings in a month. - POCT UA - Microalbumin - CBC with Differential/Platelet - Comprehensive metabolic panel - empagliflozin (JARDIANCE) 10 MG TABS tablet; Take 10 mg by mouth daily.  Dispense: 28 tablet; Refill: 0

## 2017-05-06 NOTE — Telephone Encounter (Signed)
Call this number to see how we could get this drug for this patient or another SGLT2 that IS on the [place].

## 2017-05-07 LAB — COMPREHENSIVE METABOLIC PANEL
ALBUMIN: 4.6 g/dL (ref 3.6–4.8)
ALK PHOS: 80 IU/L (ref 39–117)
ALT: 22 IU/L (ref 0–44)
AST: 20 IU/L (ref 0–40)
Albumin/Globulin Ratio: 1.6 (ref 1.2–2.2)
BILIRUBIN TOTAL: 1.3 mg/dL — AB (ref 0.0–1.2)
BUN / CREAT RATIO: 12 (ref 10–24)
BUN: 12 mg/dL (ref 8–27)
CHLORIDE: 96 mmol/L (ref 96–106)
CO2: 24 mmol/L (ref 20–29)
CREATININE: 0.98 mg/dL (ref 0.76–1.27)
Calcium: 9.5 mg/dL (ref 8.6–10.2)
GFR calc Af Amer: 91 mL/min/{1.73_m2} (ref >59)
GFR calc non Af Amer: 78 mL/min/{1.73_m2} (ref >59)
GLOBULIN, TOTAL: 2.8 g/dL (ref 1.5–4.5)
GLUCOSE: 210 mg/dL — AB (ref 65–99)
Potassium: 4.7 mmol/L (ref 3.5–5.2)
SODIUM: 134 mmol/L (ref 134–144)
Total Protein: 7.4 g/dL (ref 6.0–8.5)

## 2017-05-07 LAB — CBC WITH DIFFERENTIAL/PLATELET
BASOS ABS: 0 10*3/uL (ref 0.0–0.2)
Basos: 0 %
EOS (ABSOLUTE): 0.1 10*3/uL (ref 0.0–0.4)
Eos: 2 %
HEMATOCRIT: 43.7 % (ref 37.5–51.0)
Hemoglobin: 15.3 g/dL (ref 13.0–17.7)
Immature Grans (Abs): 0 10*3/uL (ref 0.0–0.1)
Immature Granulocytes: 0 %
LYMPHS ABS: 1.5 10*3/uL (ref 0.7–3.1)
Lymphs: 33 %
MCH: 32.2 pg (ref 26.6–33.0)
MCHC: 35 g/dL (ref 31.5–35.7)
MCV: 92 fL (ref 79–97)
Monocytes Absolute: 0.4 10*3/uL (ref 0.1–0.9)
Monocytes: 8 %
Neutrophils Absolute: 2.6 10*3/uL (ref 1.4–7.0)
Neutrophils: 57 %
Platelets: 255 10*3/uL (ref 150–379)
RBC: 4.75 x10E6/uL (ref 4.14–5.80)
RDW: 14 % (ref 12.3–15.4)
WBC: 4.6 10*3/uL (ref 3.4–10.8)

## 2017-05-08 ENCOUNTER — Telehealth: Payer: Self-pay

## 2017-05-08 NOTE — Telephone Encounter (Signed)
Patient sent a message through mychart this week about jardiance been expensive. Need to call this number [phone], and request exception form to get this medication to a lower tier. Below is per Vernie Murders, PA Call this number to see how we could get this drug for this patient or another SGLT2 that IS on the [facility] formulary.-aa

## 2017-05-08 NOTE — Telephone Encounter (Signed)
Please review. Thanks!  

## 2017-05-09 NOTE — Telephone Encounter (Signed)
Call this number to ask which SGLT2 drug is on the [place].

## 2017-05-12 NOTE — Telephone Encounter (Signed)
Contacted [employer] to see which alternate SGLT2 medication were on the formulary. All medications (ie. Steven Miranda) the representative was stating were tier 3 and tier 4 medications. She is faxing a tier exception form for Jardiance 10 mg- 1 tablet daily.

## 2017-05-12 NOTE — Telephone Encounter (Signed)
Acknowledged.

## 2017-05-13 NOTE — Telephone Encounter (Signed)
Received tier exception form. It is on Dennis's desk to be completed.

## 2017-05-16 NOTE — Progress Notes (Signed)
Yes please ok to give samples

## 2017-05-16 NOTE — Progress Notes (Signed)
Patient was prescribed Jardiance 10 mg daily. He states medication is a tier 4 and will cost to much. Contacted [employer] to get a tier exception form faxed. Simona Huh completed form on 8/14 and exception was denied on 8/15. Can patient have Jardiance 10 mg samples until Simona Huh returns to change medication?

## 2017-05-16 NOTE — Progress Notes (Signed)
Jardiance 10 mg- 14 day supply samples placed at front desk for pick up. Patient advised. He states he is out of town until Monday and will come by on Monday or Tuesday to pick up.

## 2017-05-30 ENCOUNTER — Ambulatory Visit: Payer: Medicare HMO

## 2017-06-06 ENCOUNTER — Encounter: Payer: Self-pay | Admitting: Family Medicine

## 2017-06-06 ENCOUNTER — Ambulatory Visit (INDEPENDENT_AMBULATORY_CARE_PROVIDER_SITE_OTHER): Payer: Medicare HMO | Admitting: Family Medicine

## 2017-06-06 DIAGNOSIS — M25519 Pain in unspecified shoulder: Secondary | ICD-10-CM | POA: Insufficient documentation

## 2017-06-06 DIAGNOSIS — E119 Type 2 diabetes mellitus without complications: Secondary | ICD-10-CM

## 2017-06-06 DIAGNOSIS — M2550 Pain in unspecified joint: Secondary | ICD-10-CM | POA: Insufficient documentation

## 2017-06-06 MED ORDER — EMPAGLIFLOZIN 10 MG PO TABS: 10.0000 mg | ORAL_TABLET | Freq: Every day | ORAL | 3 refills | Status: DC

## 2017-06-06 NOTE — Progress Notes (Signed)
Patient: Steven Miranda Male    DOB: [birth year]   71 y.o.   MRN: [id number] Visit Date: 06/06/2017  Today's Provider: Vernie Murders, PA   Chief Complaint  Patient presents with  . Diabetes  . Follow-up   Subjective:    HPI  Diabetes Mellitus Type II, Follow-up:   Lab Results  Component Value Date   HGBA1C 8.7 03/29/2017   HGBA1C 8.1 (H) 01/01/2017   HGBA1C 7.0 (H) 06/20/2016    Last seen for diabetes 1 months ago.  Management since then includes started Jardiance 10 mg and advised to increase exercise, watch diet, and check FBS. He reports good compliance with treatment. He is not having side effects.  Current symptoms include none  Home blood sugar records: fasting range: low 100's most of the time, occasionally 130-140's  Episodes of hypoglycemia? no   Current Insulin Regimen: none Weight trend: stable Current diet: well balanced Current exercise: running   Pertinent Labs:    Component Value Date/Time   CHOL 149 01/01/2017 0901   TRIG 79 01/01/2017 0901   HDL 55 01/01/2017 0901   LDLCALC 78 01/01/2017 0901   CREATININE 0.98 05/06/2017 0936    Wt Readings from Last 3 Encounters:  06/06/17 253 lb 6.4 oz (114.9 kg)  05/06/17 257 lb 12.8 oz (116.9 kg)  04/29/17 259 lb 12.8 oz (117.8 kg)    ------------------------------------------------------------------------ Past Medical History:  Diagnosis Date  . Diabetes mellitus without complication ([place])   . Dysuria   . Hypertension    Past Surgical History:  Procedure Laterality Date  . COLONOSCOPY  2005  . COLONOSCOPY WITH PROPOFOL N/A 10/16/2016   Procedure: COLONOSCOPY WITH PROPOFOL;  Surgeon: Robert Bellow, MD;  Location: [facility] ENDOSCOPY;  Service: Endoscopy;  Laterality: N/A;  . STRABISMUS SURGERY Right    Family History  Problem Relation Age of Onset  . Cancer Mother   . Alcohol abuse Father   . Heart disease Maternal Uncle   . Rheumatic fever Maternal Uncle   . Diabetes Paternal Grandmother     . Emphysema Paternal Grandfather    No Known Allergies   Previous Medications   ACCU-CHEK SMARTVIEW TEST STRIP    CHECK  FASTING  BLOOD  SUGAR EVERY MORNING AS INSTRUCTED   AMLODIPINE (NORVASC) 5 MG TABLET    Take 1 tablet (5 mg total) by mouth at bedtime.   ASPIRIN 81 MG TABLET    Take 1 tablet by mouth daily.   ATORVASTATIN (LIPITOR) 20 MG TABLET    Take 1 tablet (20 mg total) by mouth daily.   BLOOD GLUCOSE MONITORING SUPPL (ACCU-CHEK NANO SMARTVIEW) W/DEVICE KIT    Use glucometer (Accu-Chek Nano Glucometer) to test fasting glucose daily.   EMPAGLIFLOZIN (JARDIANCE) 10 MG TABS TABLET    Take 10 mg by mouth daily.   FERROUS SULFATE (IRON SUPPLEMENT PO)    Take 1 tablet by mouth daily.   LANCETS (ONETOUCH ULTRASOFT) LANCETS    Use as instructed once a day for fasting glucose check.   METFORMIN (GLUCOPHAGE) 1000 MG TABLET    Take 1 tablet (1,000 mg total) by mouth 2 (two) times daily with a meal.   MISC NATURAL PRODUCTS (OSTEO BI-FLEX ADV DOUBLE ST) CAPS    Take 1 capsule by mouth daily.   PROBIOTIC PRODUCT (PROBIOTIC FORMULA) CAPS    Take by mouth daily.    Review of Systems  Constitutional: Negative.   Respiratory: Negative.   Cardiovascular: Negative.   Endocrine: Negative.  Social History  Substance Use Topics  . Smoking status: Former Smoker    Types: Cigarettes  . Smokeless tobacco: Never Used     Comment: quit in 1978  . Alcohol use 3.0 - 3.6 oz/week    1 - 2 Standard drinks or equivalent, 4 Cans of beer per week   Objective:   BP 106/70 (BP Location: Right Arm, Patient Position: Sitting, Cuff Size: Normal)   Pulse (!) 54   Temp 97.9 F (36.6 C) (Oral)   Wt 253 lb 6.4 oz (114.9 kg)   SpO2 96%   BMI 31.67 kg/m   Physical Exam  Constitutional: He is oriented to person, place, and time. He appears well-developed and well-nourished. No distress.  HENT:  Head: Normocephalic and atraumatic.  Right Ear: Hearing normal.  Left Ear: Hearing normal.  Nose: Nose  normal.  Eyes: Conjunctivae and lids are normal. Right eye exhibits no discharge. Left eye exhibits no discharge. No scleral icterus.  Cardiovascular: Normal rate and regular rhythm.   Pulmonary/Chest: Effort normal. No respiratory distress.  Abdominal: Soft. Bowel sounds are normal.  Musculoskeletal: Normal range of motion.  Neurological: He is alert and oriented to person, place, and time.  Skin: Skin is intact. No lesion and no rash noted.  Psychiatric: He has a normal mood and affect. His speech is normal and behavior is normal. Thought content normal.       Assessment & Plan:     1. Type 2 diabetes mellitus without complication, without long-term current use of insulin (HCC) Continues to take the Jardiance 10 mg qd and Metformin 1000 mg BID. Trying to regulate diet better and continues regular exercise program. FBS in the 113-143 range the past 4-5 days. Feeling well without significant polyuria, polydipsia or vision changes. No peripheral neuropathy. Given more Jardiance samples to help him through the "[facility]" and recheck in 3 months. - empagliflozin (JARDIANCE) 10 MG TABS tablet; Take 10 mg by mouth daily.  Dispense: 90 tablet; Refill: 3

## 2017-06-19 ENCOUNTER — Encounter: Payer: Self-pay | Admitting: Family Medicine

## 2017-06-20 ENCOUNTER — Other Ambulatory Visit: Payer: Self-pay | Admitting: Family Medicine

## 2017-06-20 DIAGNOSIS — E119 Type 2 diabetes mellitus without complications: Secondary | ICD-10-CM

## 2017-06-20 MED ORDER — EMPAGLIFLOZIN 10 MG PO TABS: 10.0000 mg | ORAL_TABLET | Freq: Every day | ORAL | 3 refills | Status: DC

## 2017-06-25 MED ORDER — EMPAGLIFLOZIN 10 MG PO TABS: 10.0000 mg | ORAL_TABLET | Freq: Every day | ORAL | 3 refills | Status: DC

## 2017-06-25 NOTE — Progress Notes (Signed)
Patient called this morning and states he contacted [facility] and they did not receive the RX for Jardiance. Simona Huh approved RX to be sent to [facility] on 06/20/17, but RX was set to sample instead of normal. So RX was not e scribed to them. Sent RX to [facility]. Patient is aware.

## 2017-06-26 DIAGNOSIS — H524 Presbyopia: Secondary | ICD-10-CM | POA: Diagnosis not present

## 2017-07-09 ENCOUNTER — Encounter: Payer: Self-pay | Admitting: Physician Assistant

## 2017-07-09 ENCOUNTER — Ambulatory Visit (INDEPENDENT_AMBULATORY_CARE_PROVIDER_SITE_OTHER): Payer: Medicare HMO | Admitting: Physician Assistant

## 2017-07-09 VITALS — BP 116/62 | HR 60 | Temp 97.5°F | Wt 250.0 lb

## 2017-07-09 DIAGNOSIS — J069 Acute upper respiratory infection, unspecified: Secondary | ICD-10-CM

## 2017-07-09 DIAGNOSIS — R0981 Nasal congestion: Secondary | ICD-10-CM | POA: Diagnosis not present

## 2017-07-09 MED ORDER — FLUTICASONE PROPIONATE 50 MCG/ACT NA SUSP: 2.0000 | Freq: Every day | NASAL | 6 refills | Status: DC

## 2017-07-09 NOTE — Progress Notes (Signed)
[place]  Chief Complaint  Patient presents with  . Sinusitis    Started Monday evening    Subjective:    Patient ID: GRANT SWAGER, male    DOB: 07-11-1946, 71 y.o.   MRN: [id number]  Upper Respiratory Infection: WEBBER MICHIELS is a 71 y.o. male with a past medical history significant for HTN complaining of possible sinusitis. Symptoms include cough, plugged sensation in both ears and sore throat. Onset of symptoms was 2 days ago, gradually worsening since that time. He also c/o lightheadedness, non productive cough and post nasal drip for the past 2 days .  He is drinking plenty of fluids. Evaluation to date: none. Treatment to date: decongestants. The treatment has provided minimal.   Review of Systems  HENT: Positive for congestion, postnasal drip, sinus pain, sinus pressure and sore throat. Negative for ear discharge, ear pain, nosebleeds, rhinorrhea, tinnitus, trouble swallowing and voice change.   Eyes: Negative.   Respiratory: Positive for cough. Negative for apnea, choking, chest tightness, shortness of breath, wheezing and stridor.   Gastrointestinal: Negative.   Musculoskeletal: Positive for neck stiffness.  Neurological: Positive for light-headedness and headaches. Negative for dizziness.       Objective:   BP 116/62 (BP Location: Left Arm, Patient Position: Sitting, Cuff Size: Large)   Pulse 60   Temp (!) 97.5 F (36.4 C) (Oral)   Wt 250 lb (113.4 kg)   SpO2 97%   BMI 31.25 kg/m   Patient Active Problem List   Diagnosis Date Noted  . Shoulder joint pain 06/06/2017  . Abdominal pain, left lower quadrant 08/12/2016  . Acid indigestion 02/02/2015  . Essential (primary) hypertension 02/02/2015  . Injury of tendon of rotator cuff 02/02/2015  . Diabetes mellitus, type 2 ([place]) 02/02/2015  . HLD (hyperlipidemia) 05/19/2007    Outpatient Encounter Prescriptions as of 07/09/2017  Medication Sig Note  . ACCU-CHEK SMARTVIEW  test strip CHECK  FASTING  BLOOD  SUGAR EVERY MORNING AS INSTRUCTED   . amLODipine (NORVASC) 5 MG tablet Take 1 tablet (5 mg total) by mouth at bedtime.   Marland Kitchen aspirin 81 MG tablet Take 1 tablet by mouth daily. 02/02/2015: Received from: [place]:   . atorvastatin (LIPITOR) 20 MG tablet Take 1 tablet (20 mg total) by mouth daily.   . Blood Glucose Monitoring Suppl (ACCU-CHEK NANO SMARTVIEW) w/Device KIT Use glucometer (Accu-Chek Nano Glucometer) to test fasting glucose daily.   . empagliflozin (JARDIANCE) 10 MG TABS tablet Take 10 mg by mouth daily.   . Ferrous Sulfate (IRON SUPPLEMENT PO) Take 1 tablet by mouth daily.   . metFORMIN (GLUCOPHAGE) 1000 MG tablet Take 1 tablet (1,000 mg total) by mouth 2 (two) times daily with a meal.   . Misc Natural Products (OSTEO BI-FLEX ADV DOUBLE ST) CAPS Take 1 capsule by mouth daily. 02/02/2015: Received from: [place]:   . Probiotic Product (PROBIOTIC FORMULA) CAPS Take by mouth daily.   . Lancets (ONETOUCH ULTRASOFT) lancets Use as instructed once a day for fasting glucose check.    No facility-administered encounter medications on file as of 07/09/2017.     No Known Allergies     Physical Exam  Constitutional: He is oriented to person, place, and time. He appears well-developed and well-nourished. No distress.  HENT:  Right Ear: Tympanic membrane and external ear normal.  Left Ear: Tympanic membrane and external ear normal.  Nose: Rhinorrhea present. Right sinus exhibits no  maxillary sinus tenderness and no frontal sinus tenderness. Left sinus exhibits no maxillary sinus tenderness and no frontal sinus tenderness.  Mouth/Throat: Uvula is midline, oropharynx is clear and moist and mucous membranes are normal. No oropharyngeal exudate.  Clear rhinorrhea  Eyes: Conjunctivae are normal. Right eye exhibits discharge. Left eye exhibits discharge.  Watery Discharge   Neck: Normal range of  motion. Neck supple.  Cardiovascular: Normal rate and regular rhythm.   Pulmonary/Chest: Effort normal and breath sounds normal. No respiratory distress. He has no wheezes. He has no rales.  Lymphadenopathy:    He has no cervical adenopathy.  Neurological: He is alert and oriented to person, place, and time.  Skin: Skin is warm and dry. He is not diaphoretic.  Psychiatric: He has a normal mood and affect. His behavior is normal.       Assessment & Plan:  1. Viral URI  Explained course of viral illness, 7-10 day course. May call back 10/18 if not feeling better. Do not take sudafed or other decongestants besides Corcidin specifically for HTN.  2. Nasal congestion  - fluticasone (FLONASE) 50 MCG/ACT nasal spray; Place 2 sprays into both nostrils daily.  Dispense: 16 g; Refill: 6   Return if symptoms worsen or fail to improve.  The entirety of the information documented in the History of Present Illness, Review of Systems and Physical Exam were personally obtained by me. Portions of this information were initially documented by Ashley Royalty, CMA and reviewed by me for thoroughness and accuracy.

## 2017-07-09 NOTE — Patient Instructions (Signed)
Corcidin is decongestant for high blood pressure Please do not take Sudafed or anything you  Have to give license for    Upper Respiratory Infection, Adult Most upper respiratory infections (URIs) are caused by a virus. A URI affects the nose, throat, and upper air passages. The most common type of URI is often called "the common cold." Follow these instructions at home:  Take medicines only as told by your doctor.  Gargle warm saltwater or take cough drops to comfort your throat as told by your doctor.  Use a warm mist humidifier or inhale steam from a shower to increase air moisture. This may make it easier to breathe.  Drink enough fluid to keep your pee (urine) clear or pale yellow.  Eat soups and other clear broths.  Have a healthy diet.  Rest as needed.  Go back to work when your fever is gone or your doctor says it is okay. ? You may need to stay home longer to avoid giving your URI to others. ? You can also wear a face mask and wash your hands often to prevent spread of the virus.  Use your inhaler more if you have asthma.  Do not use any tobacco products, including cigarettes, chewing tobacco, or electronic cigarettes. If you need help quitting, ask your doctor. Contact a doctor if:  You are getting worse, not better.  Your symptoms are not helped by medicine.  You have chills.  You are getting more short of breath.  You have brown or red mucus.  You have yellow or brown discharge from your nose.  You have pain in your face, especially when you bend forward.  You have a fever.  You have puffy (swollen) neck glands.  You have pain while swallowing.  You have white areas in the back of your throat. Get help right away if:  You have very bad or constant: ? Headache. ? Ear pain. ? Pain in your forehead, behind your eyes, and over your cheekbones (sinus pain). ? Chest pain.  You have long-lasting (chronic) lung disease and any of the  following: ? Wheezing. ? Long-lasting cough. ? Coughing up blood. ? A change in your usual mucus.  You have a stiff neck.  You have changes in your: ? Vision. ? Hearing. ? Thinking. ? Mood. This information is not intended to replace advice given to you by your health care provider. Make sure you discuss any questions you have with your health care provider. Document Released: 03/04/2008 Document Revised: 05/19/2016 Document Reviewed: 12/22/2013 Elsevier Interactive Patient Education  2018 [employer].

## 2017-08-05 ENCOUNTER — Encounter: Payer: Self-pay | Admitting: Family Medicine

## 2017-08-07 ENCOUNTER — Other Ambulatory Visit: Payer: Self-pay

## 2017-08-07 DIAGNOSIS — E0800 Diabetes mellitus due to underlying condition with hyperosmolarity without nonketotic hyperglycemic-hyperosmolar coma (NKHHC): Secondary | ICD-10-CM

## 2017-08-07 DIAGNOSIS — I10 Essential (primary) hypertension: Secondary | ICD-10-CM

## 2017-08-07 DIAGNOSIS — E782 Mixed hyperlipidemia: Secondary | ICD-10-CM

## 2017-08-07 MED ORDER — AMLODIPINE BESYLATE 5 MG PO TABS: 5.0000 mg | ORAL_TABLET | Freq: Every day | ORAL | 3 refills | Status: DC

## 2017-08-07 MED ORDER — ATORVASTATIN CALCIUM 20 MG PO TABS: 20.0000 mg | ORAL_TABLET | Freq: Every day | ORAL | 3 refills | Status: DC

## 2017-08-07 MED ORDER — METFORMIN HCL 1000 MG PO TABS: 1000.0000 mg | ORAL_TABLET | Freq: Two times a day (BID) | ORAL | 3 refills | Status: DC

## 2017-09-17 ENCOUNTER — Encounter: Payer: Self-pay | Admitting: Physician Assistant

## 2017-09-17 ENCOUNTER — Ambulatory Visit: Payer: Medicare HMO | Admitting: Physician Assistant

## 2017-09-17 VITALS — BP 126/84 | HR 64 | Temp 97.6°F | Resp 16 | Wt 252.0 lb

## 2017-09-17 DIAGNOSIS — R0981 Nasal congestion: Secondary | ICD-10-CM | POA: Diagnosis not present

## 2017-09-17 NOTE — Progress Notes (Signed)
[place]  Chief Complaint  Patient presents with  . Nasal Congestion    Subjective:    Patient ID: Steven Miranda, male    DOB: Mar 28, 1946, 71 y.o.   MRN: [id number]  Upper Respiratory Infection: Steven Miranda is a 71 y.o. male with a complaining of possible sinusitis. Symptoms include congestion, plugged sensation in both ears and sore throat. Onset of symptoms was 1 week ago, gradually worsening since that time. He also c/o bilateral ear pressure/pain, sinus pressure and sneezing for the past 1 week . Denies purulent rhinorrhea, endorses clear rhinorrhea. He is drinking plenty of fluids. Evaluation to date: none. Treatment to date: nasal steroids. The treatment has provided some relief.   Review of Systems  Constitutional: Positive for fatigue. Negative for activity change, appetite change, chills, diaphoresis, fever and unexpected weight change.  HENT: Positive for congestion, ear pain, sinus pressure, sinus pain, sneezing and sore throat. Negative for ear discharge, nosebleeds, postnasal drip, rhinorrhea, tinnitus, trouble swallowing and voice change.   Respiratory: Negative.   Gastrointestinal: Negative.   Neurological: Negative for dizziness, light-headedness and headaches.       Objective:   BP 126/84 (BP Location: Left Arm, Patient Position: Sitting, Cuff Size: Normal)   Pulse 64   Temp 97.6 F (36.4 C) (Oral)   Resp 16   Wt 252 lb (114.3 kg)   BMI 31.50 kg/m   Patient Active Problem List   Diagnosis Date Noted  . Shoulder joint pain 06/06/2017  . Abdominal pain, left lower quadrant 08/12/2016  . Acid indigestion 02/02/2015  . Essential (primary) hypertension 02/02/2015  . Injury of tendon of rotator cuff 02/02/2015  . Diabetes mellitus, type 2 ([place]) 02/02/2015  . HLD (hyperlipidemia) 05/19/2007    Outpatient Encounter Medications as of 09/17/2017  Medication Sig Note  . ACCU-CHEK SMARTVIEW test strip CHECK  FASTING   BLOOD  SUGAR EVERY MORNING AS INSTRUCTED   . amLODipine (NORVASC) 5 MG tablet Take 1 tablet (5 mg total) at bedtime by mouth.   Marland Kitchen aspirin 81 MG tablet Take 1 tablet by mouth daily. 02/02/2015: Received from: [place]:   . atorvastatin (LIPITOR) 20 MG tablet Take 1 tablet (20 mg total) daily by mouth.   . Blood Glucose Monitoring Suppl (ACCU-CHEK NANO SMARTVIEW) w/Device KIT Use glucometer (Accu-Chek Nano Glucometer) to test fasting glucose daily.   . empagliflozin (JARDIANCE) 10 MG TABS tablet Take 10 mg by mouth daily.   . Ferrous Sulfate (IRON SUPPLEMENT PO) Take 1 tablet by mouth daily.   . fluticasone (FLONASE) 50 MCG/ACT nasal spray Place 2 sprays into both nostrils daily.   . Lancets (ONETOUCH ULTRASOFT) lancets Use as instructed once a day for fasting glucose check.   . metFORMIN (GLUCOPHAGE) 1000 MG tablet Take 1 tablet (1,000 mg total) 2 (two) times daily with a meal by mouth.   . Misc Natural Products (OSTEO BI-FLEX ADV DOUBLE ST) CAPS Take 1 capsule by mouth daily. 02/02/2015: Received from: [place]:   . Probiotic Product (PROBIOTIC FORMULA) CAPS Take by mouth daily.    No facility-administered encounter medications on file as of 09/17/2017.     No Known Allergies     Physical Exam  Constitutional: He is oriented to person, place, and time. He appears well-developed and well-nourished.  HENT:  Right Ear: Tympanic membrane and external ear normal.  Left Ear: Tympanic membrane and external ear normal.  Mouth/Throat: Oropharynx is clear and moist.  No oropharyngeal exudate.  Eyes: Conjunctivae are normal.  Neck: Neck supple.  Cardiovascular: Normal rate and regular rhythm.  Pulmonary/Chest: Effort normal and breath sounds normal.  Lymphadenopathy:    He has no cervical adenopathy.  Neurological: He is alert and oriented to person, place, and time.  Skin: Skin is warm and dry.  Psychiatric: He has a normal mood  and affect. His behavior is normal.       Assessment & Plan:  1. Sinus congestion  Do not think he needs antibiotics. Think he may need more allergy controlled. Have given samples of xyzal. He may take 2.5 mg or 5 mg nightly. May call back if progresses to purulent rhinorrhea or severe facial pain.   2. Nasal congestion  Return if symptoms worsen or fail to improve.   Patient Instructions  Sinusitis, Adult Sinusitis is soreness and inflammation of your sinuses. Sinuses are hollow spaces in the bones around your face. They are located:  Around your eyes.  In the middle of your forehead.  Behind your nose.  In your cheekbones.  Your sinuses and nasal passages are lined with a stringy fluid (mucus). Mucus normally drains out of your sinuses. When your nasal tissues get inflamed or swollen, the mucus can get trapped or blocked so air cannot flow through your sinuses. This lets bacteria, viruses, and funguses grow, and that leads to infection. Follow these instructions at home: Medicines  Take, use, or apply over-the-counter and prescription medicines only as told by your doctor. These may include nasal sprays.  If you were prescribed an antibiotic medicine, take it as told by your doctor. Do not stop taking the antibiotic even if you start to feel better. Hydrate and Humidify  Drink enough water to keep your pee (urine) clear or pale yellow.  Use a cool mist humidifier to keep the humidity level in your home above 50%.  Breathe in steam for 10-15 minutes, 3-4 times a day or as told by your doctor. You can do this in the bathroom while a hot shower is running.  Try not to spend time in cool or dry air. Rest  Rest as much as possible.  Sleep with your head raised (elevated).  Make sure to get enough sleep each night. General instructions  Put a warm, moist washcloth on your face 3-4 times a day or as told by your doctor. This will help with discomfort.  Wash your hands  often with soap and water. If there is no soap and water, use hand sanitizer.  Do not smoke. Avoid being around people who are smoking (secondhand smoke).  Keep all follow-up visits as told by your doctor. This is important. Contact a doctor if:  You have a fever.  Your symptoms get worse.  Your symptoms do not get better within 10 days. Get help right away if:  You have a very bad headache.  You cannot stop throwing up (vomiting).  You have pain or swelling around your face or eyes.  You have trouble seeing.  You feel confused.  Your neck is stiff.  You have trouble breathing. This information is not intended to replace advice given to you by your health care provider. Make sure you discuss any questions you have with your health care provider. Document Released: 03/04/2008 Document Revised: 05/12/2016 Document Reviewed: 07/12/2015 Elsevier Interactive Patient Education  [employer].     The entirety of the information documented in the History of Present Illness, Review of Systems and Physical  Exam were personally obtained by me. Portions of this information were initially documented by Ashley Royalty, CMA and reviewed by me for thoroughness and accuracy.

## 2017-09-17 NOTE — Patient Instructions (Signed)

## 2017-10-02 ENCOUNTER — Encounter: Payer: Self-pay | Admitting: Family Medicine

## 2017-10-26 ENCOUNTER — Emergency Department
Admission: EM | Admit: 2017-10-26 | Discharge: 2017-10-26 | Disposition: A | Discharge status: Home / Self Care | Payer: Medicare HMO | Attending: Emergency Medicine | Admitting: Emergency Medicine

## 2017-10-26 ENCOUNTER — Emergency Department: Payer: Medicare HMO

## 2017-10-26 ENCOUNTER — Other Ambulatory Visit: Payer: Self-pay

## 2017-10-26 DIAGNOSIS — Z7984 Long term (current) use of oral hypoglycemic drugs: Secondary | ICD-10-CM | POA: Insufficient documentation

## 2017-10-26 DIAGNOSIS — Z79899 Other long term (current) drug therapy: Secondary | ICD-10-CM | POA: Insufficient documentation

## 2017-10-26 DIAGNOSIS — Z7982 Long term (current) use of aspirin: Secondary | ICD-10-CM | POA: Insufficient documentation

## 2017-10-26 DIAGNOSIS — E119 Type 2 diabetes mellitus without complications: Secondary | ICD-10-CM | POA: Insufficient documentation

## 2017-10-26 DIAGNOSIS — R072 Precordial pain: Secondary | ICD-10-CM | POA: Diagnosis not present

## 2017-10-26 DIAGNOSIS — I10 Essential (primary) hypertension: Secondary | ICD-10-CM | POA: Insufficient documentation

## 2017-10-26 DIAGNOSIS — R0789 Other chest pain: Secondary | ICD-10-CM | POA: Diagnosis not present

## 2017-10-26 DIAGNOSIS — Z87891 Personal history of nicotine dependence: Secondary | ICD-10-CM | POA: Insufficient documentation

## 2017-10-26 DIAGNOSIS — R1013 Epigastric pain: Secondary | ICD-10-CM | POA: Diagnosis not present

## 2017-10-26 LAB — BASIC METABOLIC PANEL
ANION GAP: 9 (ref 5–15)
BUN: 16 mg/dL (ref 6–20)
CO2: 25 mmol/L (ref 22–32)
Calcium: 9.1 mg/dL (ref 8.9–10.3)
Chloride: 101 mmol/L (ref 101–111)
Creatinine, Ser: 1.1 mg/dL (ref 0.61–1.24)
GFR calc Af Amer: >60 mL/min (ref >60)
Glucose, Bld: 204 mg/dL — ABNORMAL HIGH (ref 65–99)
POTASSIUM: 3.9 mmol/L (ref 3.5–5.1)
SODIUM: 135 mmol/L (ref 135–145)

## 2017-10-26 LAB — CBC
HEMATOCRIT: 43.9 % (ref 40.0–52.0)
HEMOGLOBIN: 15.4 g/dL (ref 13.0–18.0)
MCH: 32.5 pg (ref 26.0–34.0)
MCHC: 35 g/dL (ref 32.0–36.0)
MCV: 93 fL (ref 80.0–100.0)
Platelets: 242 10*3/uL (ref 150–440)
RBC: 4.73 MIL/uL (ref 4.40–5.90)
RDW: 13.5 % (ref 11.5–14.5)
WBC: 5.4 10*3/uL (ref 3.8–10.6)

## 2017-10-26 LAB — TROPONIN I: Troponin I: <0.03 ng/mL (ref <0.03)

## 2017-10-26 MED ORDER — FAMOTIDINE 20 MG PO TABS
40.0000 mg | ORAL_TABLET | Freq: Once | ORAL | Status: AC
  Administered 2017-10-26: 40 mg via ORAL
  Filled 2017-10-26: qty 2

## 2017-10-26 MED ORDER — GI COCKTAIL ~~LOC~~
30.0000 mL | Freq: Once | ORAL | Status: AC
  Administered 2017-10-26: 30 mL via ORAL
  Filled 2017-10-26: qty 30

## 2017-10-26 NOTE — ED Triage Notes (Signed)
Patient reports symptoms began Friday night, constant.  Reports mid sternal chest "burning" and having a headache.  Patient denies any other complaints.

## 2017-10-26 NOTE — ED Provider Notes (Signed)
[facility] Emergency Department Provider Note  ____________________________________________   First MD Initiated Contact with Patient 10/26/17 0258     (approximate)  I have reviewed the triage vital signs and the nursing notes.   HISTORY  Chief Complaint Headache and Chest Pain   HPI Steven Miranda is a 72 y.o. male who comes to the emergency department with substernal epigastric burning discomfort that began around 10 PM when he was lying down to go to sleep.  He has a long-standing history of gastric reflux and this feels similar.  He normally takes ranitidine although today that did not help.  He had 2 pieces of pizza for dinner.  His pain is nonradiating.  It is constant.  It is nonexertional.  It is associated with nausea but no vomiting.  No diarrhea.  No diaphoresis.  He has no history of coronary artery disease.  No history of DVT or pulmonary embolism.  His pain is currently moderate in severity constant.  It had insidious onset.  Nothing seems to make it better or worse.  Past Medical History:  Diagnosis Date  . Diabetes mellitus without complication ([place])   . Dysuria   . Hypertension     Patient Active Problem List   Diagnosis Date Noted  . Shoulder joint pain 06/06/2017  . Abdominal pain, left lower quadrant 08/12/2016  . Acid indigestion 02/02/2015  . Essential (primary) hypertension 02/02/2015  . Injury of tendon of rotator cuff 02/02/2015  . Diabetes mellitus, type 2 ([place]) 02/02/2015  . HLD (hyperlipidemia) 05/19/2007    Past Surgical History:  Procedure Laterality Date  . COLONOSCOPY  2005  . COLONOSCOPY WITH PROPOFOL N/A 10/16/2016   Procedure: COLONOSCOPY WITH PROPOFOL;  Surgeon: Robert Bellow, MD;  Location: [facility] ENDOSCOPY;  Service: Endoscopy;  Laterality: N/A;  . STRABISMUS SURGERY Right     Prior to Admission medications   Medication Sig Start Date End Date Taking? Authorizing Provider  ACCU-CHEK SMARTVIEW test strip  CHECK  FASTING  BLOOD  SUGAR EVERY MORNING AS INSTRUCTED 10/29/16   Chrismon, Vickki Muff, PA  amLODipine (NORVASC) 5 MG tablet Take 1 tablet (5 mg total) at bedtime by mouth. 08/07/17   Chrismon, Vickki Muff, PA  aspirin 81 MG tablet Take 1 tablet by mouth daily.    [provider]  atorvastatin (LIPITOR) 20 MG tablet Take 1 tablet (20 mg total) daily by mouth. 08/07/17   Chrismon, Vickki Muff, PA  Blood Glucose Monitoring Suppl (ACCU-CHEK NANO SMARTVIEW) w/Device KIT Use glucometer (Accu-Chek Nano Glucometer) to test fasting glucose daily. 12/31/16   Chrismon, Vickki Muff, PA  empagliflozin (JARDIANCE) 10 MG TABS tablet Take 10 mg by mouth daily. 06/25/17   Chrismon, Vickki Muff, PA  Ferrous Sulfate (IRON SUPPLEMENT PO) Take 1 tablet by mouth daily.    [provider]  fluticasone (FLONASE) 50 MCG/ACT nasal spray Place 2 sprays into both nostrils daily. 07/09/17   Trinna Post, PA-C  Lancets [facility] ULTRASOFT) lancets Use as instructed once a day for fasting glucose check. 03/30/15   Chrismon, Vickki Muff, PA  metFORMIN (GLUCOPHAGE) 1000 MG tablet Take 1 tablet (1,000 mg total) 2 (two) times daily with a meal by mouth. 08/07/17   Chrismon, Vickki Muff, PA  Misc Natural Products (OSTEO BI-FLEX ADV DOUBLE ST) CAPS Take 1 capsule by mouth daily.    [provider]  Probiotic Product (PROBIOTIC FORMULA) CAPS Take by mouth daily.    [provider]    Allergies Patient  has no known allergies.  Family History  Problem Relation Age of Onset  . Cancer Mother   . Alcohol abuse Father   . Heart disease Maternal Uncle   . Rheumatic fever Maternal Uncle   . Diabetes Paternal Grandmother   . Emphysema Paternal Grandfather     Social History Social History   Tobacco Use  . Smoking status: Former Smoker    Types: Cigarettes  . Smokeless tobacco: Never Used  . Tobacco comment: quit in 1978  Substance Use Topics  . Alcohol use: Yes    Alcohol/week: 3.0 - 3.6 oz    Types: 1 - 2  Standard drinks or equivalent, 4 Cans of beer per week  . Drug use: No    Review of Systems Constitutional: No fever/chills Eyes: No visual changes. ENT: No sore throat. Cardiovascular: Positive for chest pain. Respiratory: Denies shortness of breath. Gastrointestinal: Positive for abdominal pain.  Positive for nausea, no vomiting.  No diarrhea.  No constipation. Genitourinary: Negative for dysuria. Musculoskeletal: Negative for back pain. Skin: Negative for rash. Neurological: Negative for headaches, focal weakness or numbness.   ____________________________________________   PHYSICAL EXAM:  VITAL SIGNS: ED Triage Vitals  Enc Vitals Group     BP 10/26/17 0108 (!) 174/83     Pulse Rate 10/26/17 0108 79     Resp 10/26/17 0108 20     Temp 10/26/17 0108 98.5 F (36.9 C)     Temp Source 10/26/17 0108 Oral     SpO2 10/26/17 0108 99 %     Weight 10/26/17 0106 265 lb (120.2 kg)     Height 10/26/17 0106 _0  (1.88 m)     Head Circumference --      Peak Flow --      Pain Score 10/26/17 0105 6     Pain Loc --      Pain Edu? --      Excl. in [place]? --     Constitutional: Alert and oriented x4 well-appearing nontoxic no diaphoresis speaks full clear sentences Eyes: PERRL EOMI. Head: Atraumatic. Nose: No congestion/rhinnorhea. Mouth/Throat: No trismus Neck: No stridor.  Able lie completely flat no JVD Cardiovascular: Normal rate, regular rhythm. Grossly normal heart sounds.  Good peripheral circulation. Respiratory: Normal respiratory effort.  No retractions. Lungs CTAB and moving good air Gastrointestinal: Soft nontender no peritonitis Musculoskeletal: No lower extremity edema   Neurologic:  Normal speech and language. No gross focal neurologic deficits are appreciated. Skin:  Skin is warm, dry and intact. No rash noted. Psychiatric: Mood and affect are normal. Speech and behavior are normal.    ____________________________________________   DIFFERENTIAL includes but not  limited to  Acute coronary syndrome, pulmonary embolism, aortic dissection, gastritis, gastric reflux, esophageal spasm ____________________________________________   LABS (all labs ordered are listed, but only abnormal results are displayed)  Labs Reviewed  BASIC METABOLIC PANEL - Abnormal; Notable for the following components:      Result Value   Glucose, Bld 204 (*)    All other components within normal limits  CBC  TROPONIN I    Lab work reviewed by me with no signs of acute ischemia x1 __________________________________________  EKG  ED ECG REPORT I, Darel Hong, the attending physician, personally viewed and interpreted this ECG.  Date: 10/26/2017 EKG Time:  Rate: 74 Rhythm: normal sinus rhythm QRS Axis: normal Intervals: normal ST/T Wave abnormalities: normal Narrative Interpretation: no evidence of acute ischemia  ____________________________________________  RADIOLOGY  Chest x-ray reviewed by me with no  acute disease ____________________________________________   PROCEDURES  Procedure(s) performed: no  Procedures  Critical Care performed: no  Observation: no ____________________________________________   INITIAL IMPRESSION / ASSESSMENT AND PLAN / ED COURSE  Pertinent labs & imaging results that were available during my care of the patient were reviewed by me and considered in my medical decision making (see chart for details).      ----------------------------------------- 3:30 AM on 10/26/2017 -----------------------------------------  The patient has atypical symptoms which are most consistent with gastric reflux.  Given his age and comorbidities, however, I did recommend a second troponin to evaluate for anginal equivalent.  He declined stating he feels improved after a GI cocktail would prefer to go home.  Strict return precautions have been given and the patient verbalizes understanding and agreement with the  plan.  ____________________________________________   FINAL CLINICAL IMPRESSION(S) / ED DIAGNOSES  Final diagnoses:  Atypical chest pain      NEW MEDICATIONS STARTED DURING THIS VISIT:  Discharge Medication List as of 10/26/2017  3:30 AM       Note:  This document was prepared using Dragon voice recognition software and may include unintentional dictation errors.     Darel Hong, MD 10/26/17 2219

## 2017-10-26 NOTE — Discharge Instructions (Signed)
Please take your ranitidine twice a day to help with your symptoms.  Follow-up with your primary care physician this week for reevaluation.  Return to the emergency department for any concerns.  It was a pleasure to take care of you today, and thank you for coming to our emergency department.  If you have any questions or concerns before leaving please ask the nurse to grab me and I'm more than happy to go through your aftercare instructions again.  If you were prescribed any opioid pain medication today such as Norco, Vicodin, Percocet, morphine, hydrocodone, or oxycodone please make sure you do not drive when you are taking this medication as it can alter your ability to drive safely.  If you have any concerns once you are home that you are not improving or are in fact getting worse before you can make it to your follow-up appointment, please do not hesitate to call 911 and come back for further evaluation.  Darel Hong, MD  Results for orders placed or performed during the hospital encounter of 12/12/92  Basic metabolic panel  Result Value Ref Range   Sodium 135 135 - 145 mmol/L   Potassium 3.9 3.5 - 5.1 mmol/L   Chloride 101 101 - 111 mmol/L   CO2 25 22 - 32 mmol/L   Glucose, Bld 204 (H) 65 - 99 mg/dL   BUN 16 6 - 20 mg/dL   Creatinine, Ser 1.10 0.61 - 1.24 mg/dL   Calcium 9.1 8.9 - 10.3 mg/dL   GFR calc non Af Amer >60 >60 mL/min   GFR calc Af Amer >60 >60 mL/min   Anion gap 9 5 - 15  CBC  Result Value Ref Range   WBC 5.4 3.8 - 10.6 K/uL   RBC 4.73 4.40 - 5.90 MIL/uL   Hemoglobin 15.4 13.0 - 18.0 g/dL   HCT 43.9 40.0 - 52.0 %   MCV 93.0 80.0 - 100.0 fL   MCH 32.5 26.0 - 34.0 pg   MCHC 35.0 32.0 - 36.0 g/dL   RDW 13.5 11.5 - 14.5 %   Platelets 242 150 - 440 K/uL  Troponin I  Result Value Ref Range   Troponin I <0.03 <0.03 ng/mL   Dg Chest 2 View  Result Date: 10/26/2017 CLINICAL DATA:  Midsternal chest burning and headache for 1 day. EXAM: CHEST  2 VIEW COMPARISON:   11/18/2009 FINDINGS: The heart size and mediastinal contours are within normal limits. Both lungs are clear. The visualized skeletal structures are unremarkable. IMPRESSION: No active cardiopulmonary disease. Electronically Signed   By: Lucienne Capers M.D.   On: 10/26/2017 02:01

## 2017-10-27 ENCOUNTER — Encounter: Payer: Self-pay | Admitting: Family Medicine

## 2017-11-10 DIAGNOSIS — Z0100 Encounter for examination of eyes and vision without abnormal findings: Secondary | ICD-10-CM | POA: Diagnosis not present

## 2017-11-25 ENCOUNTER — Encounter: Payer: Self-pay | Admitting: Family Medicine

## 2017-11-25 ENCOUNTER — Ambulatory Visit (INDEPENDENT_AMBULATORY_CARE_PROVIDER_SITE_OTHER): Payer: Medicare HMO | Admitting: Family Medicine

## 2017-11-25 VITALS — BP 118/68 | HR 56 | Temp 98.1°F | Wt 258.0 lb

## 2017-11-25 DIAGNOSIS — M25619 Stiffness of unspecified shoulder, not elsewhere classified: Secondary | ICD-10-CM | POA: Insufficient documentation

## 2017-11-25 DIAGNOSIS — J40 Bronchitis, not specified as acute or chronic: Secondary | ICD-10-CM | POA: Diagnosis not present

## 2017-11-25 MED ORDER — AZITHROMYCIN 250 MG PO TABS: ORAL_TABLET | ORAL | 0 refills | Status: DC

## 2017-11-25 NOTE — Progress Notes (Signed)
Patient: Steven Miranda Male    DOB: April 28, 1946   72 y.o.   MRN: [id number] Visit Date: 11/25/2017  Today's Provider: Vernie Murders, PA   Chief Complaint  Patient presents with  . URI   Subjective:    URI   This is a new problem. Episode onset: 2.5 weeks. The problem has been waxing and waning. There has been no fever. Associated symptoms include congestion, coughing, headaches, rhinorrhea and a sore throat. Treatments tried: OTC cough medication, OTC cold and sinus, and Flonase. The treatment provided mild relief.   Past Medical History:  Diagnosis Date  . Diabetes mellitus without complication ([place])   . Dysuria   . Hypertension    Patient Active Problem List   Diagnosis Date Noted  . Shoulder stiffness 11/25/2017  . Shoulder joint pain 06/06/2017  . Abdominal pain, left lower quadrant 08/12/2016  . Acid indigestion 02/02/2015  . Essential (primary) hypertension 02/02/2015  . Injury of tendon of rotator cuff 02/02/2015  . Diabetes mellitus, type 2 ([place]) 02/02/2015  . HLD (hyperlipidemia) 05/19/2007   Past Surgical History:  Procedure Laterality Date  . COLONOSCOPY  2005  . COLONOSCOPY WITH PROPOFOL N/A 10/16/2016   Procedure: COLONOSCOPY WITH PROPOFOL;  Surgeon: Robert Bellow, MD;  Location: [facility] ENDOSCOPY;  Service: Endoscopy;  Laterality: N/A;  . STRABISMUS SURGERY Right    Family History  Problem Relation Age of Onset  . Cancer Mother   . Alcohol abuse Father   . Heart disease Maternal Uncle   . Rheumatic fever Maternal Uncle   . Diabetes Paternal Grandmother   . Emphysema Paternal Grandfather    No Known Allergies  Current Outpatient Medications:  .  ACCU-CHEK SMARTVIEW test strip, CHECK  FASTING  BLOOD  SUGAR EVERY MORNING AS INSTRUCTED, Disp: 100 each, Rfl: 4 .  amLODipine (NORVASC) 5 MG tablet, Take 1 tablet (5 mg total) at bedtime by mouth., Disp: 90 tablet, Rfl: 3 .  aspirin 81 MG tablet, Take 1 tablet by mouth daily., Disp: , Rfl:  .   atorvastatin (LIPITOR) 20 MG tablet, Take 1 tablet (20 mg total) daily by mouth., Disp: 90 tablet, Rfl: 3 .  Blood Glucose Monitoring Suppl (ACCU-CHEK NANO SMARTVIEW) w/Device KIT, Use glucometer (Accu-Chek Nano Glucometer) to test fasting glucose daily., Disp: 1 kit, Rfl: 0 .  empagliflozin (JARDIANCE) 10 MG TABS tablet, Take 10 mg by mouth daily., Disp: 90 tablet, Rfl: 3 .  Ferrous Sulfate (IRON SUPPLEMENT PO), Take 1 tablet by mouth daily., Disp: , Rfl:  .  fluticasone (FLONASE) 50 MCG/ACT nasal spray, Place 2 sprays into both nostrils daily., Disp: 16 g, Rfl: 6 .  Lancets (ONETOUCH ULTRASOFT) lancets, Use as instructed once a day for fasting glucose check., Disp: 100 each, Rfl: 3 .  metFORMIN (GLUCOPHAGE) 1000 MG tablet, Take 1 tablet (1,000 mg total) 2 (two) times daily with a meal by mouth., Disp: 180 tablet, Rfl: 3 .  Misc Natural Products (OSTEO BI-FLEX ADV DOUBLE ST) CAPS, Take 1 capsule by mouth daily., Disp: , Rfl:  .  Probiotic Product (PROBIOTIC FORMULA) CAPS, Take by mouth daily., Disp: , Rfl:   Review of Systems  Constitutional: Negative.   HENT: Positive for congestion, rhinorrhea and sore throat.   Respiratory: Positive for cough.   Cardiovascular: Negative.   Neurological: Positive for headaches.   Social History   Tobacco Use  . Smoking status: Former Smoker    Types: Cigarettes  . Smokeless tobacco: Never Used  .  Tobacco comment: quit in 1978  Substance Use Topics  . Alcohol use: Yes    Alcohol/week: 3.0 - 3.6 oz    Types: 1 - 2 Standard drinks or equivalent, 4 Cans of beer per week   Objective:   BP 118/68 (BP Location: Right Arm, Patient Position: Sitting, Cuff Size: Normal)   Pulse (!) 56   Temp 98.1 F (36.7 C) (Oral)   Wt 258 lb (117 kg)   SpO2 97%   BMI 33.13 kg/m  Wt Readings from Last 3 Encounters:  11/25/17 258 lb (117 kg)  10/26/17 265 lb (120.2 kg)  09/17/17 252 lb (114.3 kg)   Physical Exam  Constitutional: He is oriented to person, place,  and time. He appears well-developed and well-nourished. No distress.  HENT:  Head: Normocephalic and atraumatic.  Right Ear: Hearing and external ear normal.  Left Ear: Hearing and external ear normal.  Nose: Nose normal.  Mouth/Throat: Oropharynx is clear and moist.  Eyes: Conjunctivae and lids are normal. Right eye exhibits no discharge. Left eye exhibits no discharge. No scleral icterus.  Neck: Neck supple.  Cardiovascular: Normal rate and regular rhythm.  Pulmonary/Chest: Effort normal. No respiratory distress.  Coarse breath sounds without rales or wheezes.  Abdominal: Soft. Bowel sounds are normal.  Musculoskeletal: Normal range of motion.  Neurological: He is alert and oriented to person, place, and time.  Skin: Skin is intact. No lesion and no rash noted.  Psychiatric: He has a normal mood and affect. His speech is normal and behavior is normal. Thought content normal.      Assessment & Plan:     1. Bronchitis Onset with cough, congestion and stuffy nose with some headache over the past 2.5 weeks. No fever. Slight yellow sputum occasionally. May continue Robitussin-DM and add Z-pak. Increase fluid intake and may use Tylenol or Advil prn. Recheck if no better in 7-10 days. - azithromycin (ZITHROMAX) 250 MG tablet; Take 2 tablets by mouth today then one tablet daily for 4 days.  Dispense: 6 tablet; Refill: [place], PA  [facility]

## 2017-12-12 ENCOUNTER — Ambulatory Visit (INDEPENDENT_AMBULATORY_CARE_PROVIDER_SITE_OTHER): Payer: Medicare HMO | Admitting: Family Medicine

## 2017-12-12 ENCOUNTER — Encounter: Payer: Self-pay | Admitting: Family Medicine

## 2017-12-12 VITALS — BP 130/82 | HR 62 | Temp 98.0°F

## 2017-12-12 DIAGNOSIS — K30 Functional dyspepsia: Secondary | ICD-10-CM

## 2017-12-12 MED ORDER — DEXLANSOPRAZOLE 60 MG PO CPDR: 60.0000 mg | DELAYED_RELEASE_CAPSULE | Freq: Every day | ORAL | 0 refills | Status: DC

## 2017-12-12 MED ORDER — LIDOCAINE VISCOUS 2 % MT SOLN: OROMUCOSAL | 0 refills | Status: DC

## 2017-12-12 NOTE — Patient Instructions (Signed)
Gastroesophageal Reflux Disease, Adult Normally, food travels down the esophagus and stays in the stomach to be digested. However, when a person has gastroesophageal reflux disease (GERD), food and stomach acid move back up into the esophagus. When this happens, the esophagus becomes sore and inflamed. Over time, GERD can create small holes (ulcers) in the lining of the esophagus. What are the causes? This condition is caused by a problem with the muscle between the esophagus and the stomach (lower esophageal sphincter, or LES). Normally, the LES muscle closes after food passes through the esophagus to the stomach. When the LES is weakened or abnormal, it does not close properly, and that allows food and stomach acid to go back up into the esophagus. The LES can be weakened by certain dietary substances, medicines, and medical conditions, including:  Tobacco use.  Pregnancy.  Having a hiatal hernia.  Heavy alcohol use.  Certain foods and beverages, such as coffee, chocolate, onions, and peppermint.  What increases the risk? This condition is more likely to develop in:  People who have an increased body weight.  People who have connective tissue disorders.  People who use NSAID medicines.  What are the signs or symptoms? Symptoms of this condition include:  Heartburn.  Difficult or painful swallowing.  The feeling of having a lump in the throat.  Abitter taste in the mouth.  Bad breath.  Having a large amount of saliva.  Having an upset or bloated stomach.  Belching.  Chest pain.  Shortness of breath or wheezing.  Ongoing (chronic) cough or a night-time cough.  Wearing away of tooth enamel.  Weight loss.  Different conditions can cause chest pain. Make sure to see your health care provider if you experience chest pain. How is this diagnosed? Your health care provider will take a medical history and perform a physical exam. To determine if you have mild or severe  GERD, your health care provider may also monitor how you respond to treatment. You may also have other tests, including:  An endoscopy toexamine your stomach and esophagus with a small camera.  A test thatmeasures the acidity level in your esophagus.  A test thatmeasures how much pressure is on your esophagus.  A barium swallow or modified barium swallow to show the shape, size, and functioning of your esophagus.  How is this treated? The goal of treatment is to help relieve your symptoms and to prevent complications. Treatment for this condition may vary depending on how severe your symptoms are. Your health care provider may recommend:  Changes to your diet.  Medicine.  Surgery.  Follow these instructions at home: Diet  Follow a diet as recommended by your health care provider. This may involve avoiding foods and drinks such as: ? Coffee and tea (with or without caffeine). ? Drinks that containalcohol. ? Energy drinks and sports drinks. ? Carbonated drinks or sodas. ? Chocolate and cocoa. ? Peppermint and mint flavorings. ? Garlic and onions. ? Horseradish. ? Spicy and acidic foods, including peppers, chili powder, curry powder, vinegar, hot sauces, and barbecue sauce. ? Citrus fruit juices and citrus fruits, such as oranges, lemons, and limes. ? Tomato-based foods, such as red sauce, chili, salsa, and pizza with red sauce. ? Fried and fatty foods, such as donuts, french fries, potato chips, and high-fat dressings. ? High-fat meats, such as hot dogs and fatty cuts of red and white meats, such as rib eye steak, sausage, ham, and bacon. ? High-fat dairy items, such as whole milk,   butter, and cream cheese.  Eat small, frequent meals instead of large meals.  Avoid drinking large amounts of liquid with your meals.  Avoid eating meals during the 2-3 hours before bedtime.  Avoid lying down right after you eat.  Do not exercise right after you eat. General  instructions  Pay attention to any changes in your symptoms.  Take over-the-counter and prescription medicines only as told by your health care provider. Do not take aspirin, ibuprofen, or other NSAIDs unless your health care provider told you to do so.  Do not use any tobacco products, including cigarettes, chewing tobacco, and e-cigarettes. If you need help quitting, ask your health care provider.  Wear loose-fitting clothing. Do not wear anything tight around your waist that causes pressure on your abdomen.  Raise (elevate) the head of your bed 6 inches (15cm).  Try to reduce your stress, such as with yoga or meditation. If you need help reducing stress, ask your health care provider.  If you are overweight, reduce your weight to an amount that is healthy for you. Ask your health care provider for guidance about a safe weight loss goal.  Keep all follow-up visits as told by your health care provider. This is important. Contact a health care provider if:  You have new symptoms.  You have unexplained weight loss.  You have difficulty swallowing, or it hurts to swallow.  You have wheezing or a persistent cough.  Your symptoms do not improve with treatment.  You have a hoarse voice. Get help right away if:  You have pain in your arms, neck, jaw, teeth, or back.  You feel sweaty, dizzy, or light-headed.  You have chest pain or shortness of breath.  You vomit and your vomit looks like blood or coffee grounds.  You faint.  Your stool is bloody or black.  You cannot swallow, drink, or eat. This information is not intended to replace advice given to you by your health care provider. Make sure you discuss any questions you have with your health care provider. Document Released: 06/26/2005 Document Revised: 02/14/2016 Document Reviewed: 01/11/2015 Elsevier Interactive Patient Education  2018 [employer]  

## 2017-12-12 NOTE — Progress Notes (Signed)
Patient: Steven Miranda Male    DOB: September 12, 1946   72 y.o.   MRN: [id number] Visit Date: 12/12/2017  Today's Provider: Vernie Murders, PA   Chief Complaint  Patient presents with  . Gastroesophageal Reflux   Subjective:    Gastroesophageal Reflux  He complains of chest pain and heartburn. This is a chronic problem. The current episode started yesterday. The problem occurs constantly. The problem has been unchanged. The heartburn is located in the substernum. The heartburn wakes him from sleep. The heartburn does not limit his activity. The symptoms are aggravated by certain foods. Pertinent negatives include no fatigue. Treatments tried: generic Zantac, Benadryl, and Mylanta. The treatment provided mild relief.   Past Medical History:  Diagnosis Date  . Diabetes mellitus without complication ([place])   . Dysuria   . Hypertension    Past Surgical History:  Procedure Laterality Date  . COLONOSCOPY  2005  . COLONOSCOPY WITH PROPOFOL N/A 10/16/2016   Procedure: COLONOSCOPY WITH PROPOFOL;  Surgeon: Robert Bellow, MD;  Location: [facility] ENDOSCOPY;  Service: Endoscopy;  Laterality: N/A;  . STRABISMUS SURGERY Right    Family History  Problem Relation Age of Onset  . Cancer Mother   . Alcohol abuse Father   . Heart disease Maternal Uncle   . Rheumatic fever Maternal Uncle   . Diabetes Paternal Grandmother   . Emphysema Paternal Grandfather    No Known Allergies  Current Outpatient Medications:  .  ACCU-CHEK SMARTVIEW test strip, CHECK  FASTING  BLOOD  SUGAR EVERY MORNING AS INSTRUCTED, Disp: 100 each, Rfl: 4 .  amLODipine (NORVASC) 5 MG tablet, Take 1 tablet (5 mg total) at bedtime by mouth., Disp: 90 tablet, Rfl: 3 .  aspirin 81 MG tablet, Take 1 tablet by mouth daily., Disp: , Rfl:  .  atorvastatin (LIPITOR) 20 MG tablet, Take 1 tablet (20 mg total) daily by mouth., Disp: 90 tablet, Rfl: 3 .  Blood Glucose Monitoring Suppl (ACCU-CHEK NANO SMARTVIEW) w/Device KIT, Use  glucometer (Accu-Chek Nano Glucometer) to test fasting glucose daily., Disp: 1 kit, Rfl: 0 .  empagliflozin (JARDIANCE) 10 MG TABS tablet, Take 10 mg by mouth daily., Disp: 90 tablet, Rfl: 3 .  Ferrous Sulfate (IRON SUPPLEMENT PO), Take 1 tablet by mouth daily., Disp: , Rfl:  .  fluticasone (FLONASE) 50 MCG/ACT nasal spray, Place 2 sprays into both nostrils daily., Disp: 16 g, Rfl: 6 .  Lancets (ONETOUCH ULTRASOFT) lancets, Use as instructed once a day for fasting glucose check., Disp: 100 each, Rfl: 3 .  metFORMIN (GLUCOPHAGE) 1000 MG tablet, Take 1 tablet (1,000 mg total) 2 (two) times daily with a meal by mouth., Disp: 180 tablet, Rfl: 3 .  Misc Natural Products (OSTEO BI-FLEX ADV DOUBLE ST) CAPS, Take 1 capsule by mouth daily., Disp: , Rfl:  .  Probiotic Product (PROBIOTIC FORMULA) CAPS, Take by mouth daily., Disp: , Rfl:   Review of Systems  Constitutional: Negative for fatigue.  Cardiovascular: Positive for chest pain.  Gastrointestinal: Positive for heartburn.   Social History   Tobacco Use  . Smoking status: Former Smoker    Types: Cigarettes  . Smokeless tobacco: Never Used  . Tobacco comment: quit in 1978  Substance Use Topics  . Alcohol use: Yes    Alcohol/week: 3.0 - 3.6 oz    Types: 1 - 2 Standard drinks or equivalent, 4 Cans of beer per week   Objective:   BP 130/82 (BP Location: Right Arm, Patient Position:  Sitting, Cuff Size: Normal)   Pulse 62   Temp 98 F (36.7 C) (Oral)   SpO2 97%   Physical Exam  Constitutional: He is oriented to person, place, and time. He appears well-developed and well-nourished. No distress.  HENT:  Head: Normocephalic and atraumatic.  Right Ear: Hearing normal.  Left Ear: Hearing normal.  Nose: Nose normal.  Eyes: Conjunctivae and lids are normal. Right eye exhibits no discharge. Left eye exhibits no discharge. No scleral icterus.  Neck: Neck supple.  Cardiovascular: Normal rate and regular rhythm.  Pulmonary/Chest: Effort normal  and breath sounds normal. No respiratory distress. He exhibits no tenderness.  Abdominal: Soft. Bowel sounds are normal.  Musculoskeletal: Normal range of motion.  Neurological: He is alert and oriented to person, place, and time.  Skin: Skin is intact. No lesion and no rash noted.  Psychiatric: He has a normal mood and affect. His speech is normal and behavior is normal. Thought content normal.      Assessment & Plan:     1. Acid indigestion Had a flare of GERD causing chest discomfort the same way that occurred in the ER on 10-26-17 with a final diagnosis of atypical chest pain. A GI cocktail dose cleared that occurrence and requests a similar at home dosage. Recommend he take Dexilant 60 mg qd, limit spicy, greasy foods and caffeine. Don't overfill stomach and/or lie down after a meal. This episode started last night after eating popcorn while watching a basketball game and lying down in his bed. Given Viscous Xylocaine to mix equally with Mylanta Liquid. If occurring frequently, will need referral to GI for endoscopic evaluation. Recommend he take the [place] for 3 weeks. Call report of progress or send e-mail. - lidocaine (XYLOCAINE) 2 % solution; Take 5 ml mixed with 5 ml of Mylanta 2-3 times a day as needed for acute acid indigestion.  Dispense: 100 mL; Refill: 0 - dexlansoprazole (DEXILANT) 60 MG capsule; Take 1 capsule (60 mg total) by mouth daily.  Dispense: 20 capsule; Refill: [place], PA  [facility]

## 2017-12-19 ENCOUNTER — Encounter: Payer: Self-pay | Admitting: Physician Assistant

## 2017-12-29 ENCOUNTER — Encounter: Payer: Self-pay | Admitting: Family Medicine

## 2018-01-27 DIAGNOSIS — D692 Other nonthrombocytopenic purpura: Secondary | ICD-10-CM | POA: Diagnosis not present

## 2018-01-27 DIAGNOSIS — L821 Other seborrheic keratosis: Secondary | ICD-10-CM | POA: Diagnosis not present

## 2018-01-27 DIAGNOSIS — D229 Melanocytic nevi, unspecified: Secondary | ICD-10-CM | POA: Diagnosis not present

## 2018-01-27 DIAGNOSIS — Z1283 Encounter for screening for malignant neoplasm of skin: Secondary | ICD-10-CM | POA: Diagnosis not present

## 2018-01-27 DIAGNOSIS — L570 Actinic keratosis: Secondary | ICD-10-CM | POA: Diagnosis not present

## 2018-01-27 DIAGNOSIS — L812 Freckles: Secondary | ICD-10-CM | POA: Diagnosis not present

## 2018-01-27 DIAGNOSIS — L578 Other skin changes due to chronic exposure to nonionizing radiation: Secondary | ICD-10-CM | POA: Diagnosis not present

## 2018-01-27 DIAGNOSIS — D1800 Hemangioma unspecified site: Secondary | ICD-10-CM | POA: Diagnosis not present

## 2018-01-29 ENCOUNTER — Encounter: Payer: Self-pay | Admitting: Family Medicine

## 2018-01-30 ENCOUNTER — Ambulatory Visit (INDEPENDENT_AMBULATORY_CARE_PROVIDER_SITE_OTHER): Payer: Medicare HMO | Admitting: Family Medicine

## 2018-01-30 ENCOUNTER — Encounter: Payer: Self-pay | Admitting: Family Medicine

## 2018-01-30 VITALS — BP 118/74 | HR 68 | Temp 98.5°F | Wt 255.6 lb

## 2018-01-30 DIAGNOSIS — M720 Palmar fascial fibromatosis [Dupuytren]: Secondary | ICD-10-CM

## 2018-01-30 DIAGNOSIS — E119 Type 2 diabetes mellitus without complications: Secondary | ICD-10-CM

## 2018-01-30 DIAGNOSIS — G5601 Carpal tunnel syndrome, right upper limb: Secondary | ICD-10-CM | POA: Diagnosis not present

## 2018-01-30 NOTE — Progress Notes (Signed)
Patient: Steven Miranda Male    DOB: 10-05-1945   72 y.o.   MRN: [id number] Visit Date: 01/30/2018  Today's Provider: Vernie Murders, PA   Chief Complaint  Patient presents with  . Wrist Pain   Subjective:    Wrist Pain   The pain is present in the right wrist. This is a new problem. The current episode started more than 1 month ago. The problem occurs constantly. The problem has been gradually worsening (the past 2 weeks ). The quality of the pain is described as burning. Associated symptoms include numbness (fingers). He has tried nothing for the symptoms. Family history does not include gout or rheumatoid arthritis. His past medical history is significant for diabetes. There is no history of gout, osteoarthritis or rheumatoid arthritis.   Past Medical History:  Diagnosis Date  . Diabetes mellitus without complication ([place])   . Dysuria   . Hypertension    Patient Active Problem List   Diagnosis Date Noted  . Shoulder stiffness 11/25/2017  . Shoulder joint pain 06/06/2017  . Abdominal pain, left lower quadrant 08/12/2016  . Acid indigestion 02/02/2015  . Essential (primary) hypertension 02/02/2015  . Injury of tendon of rotator cuff 02/02/2015  . Diabetes mellitus, type 2 ([place]) 02/02/2015  . HLD (hyperlipidemia) 05/19/2007   Past Surgical History:  Procedure Laterality Date  . COLONOSCOPY  2005  . COLONOSCOPY WITH PROPOFOL N/A 10/16/2016   Procedure: COLONOSCOPY WITH PROPOFOL;  Surgeon: Robert Bellow, MD;  Location: [facility] ENDOSCOPY;  Service: Endoscopy;  Laterality: N/A;  . STRABISMUS SURGERY Right    Family History  Problem Relation Age of Onset  . Cancer Mother   . Alcohol abuse Father   . Heart disease Maternal Uncle   . Rheumatic fever Maternal Uncle   . Diabetes Paternal Grandmother   . Emphysema Paternal Grandfather    No Known Allergies  Current Outpatient Medications:  .  ACCU-CHEK SMARTVIEW test strip, CHECK  FASTING  BLOOD  SUGAR EVERY MORNING  AS INSTRUCTED, Disp: 100 each, Rfl: 4 .  amLODipine (NORVASC) 5 MG tablet, Take 1 tablet (5 mg total) at bedtime by mouth., Disp: 90 tablet, Rfl: 3 .  aspirin 81 MG tablet, Take 1 tablet by mouth daily., Disp: , Rfl:  .  atorvastatin (LIPITOR) 20 MG tablet, Take 1 tablet (20 mg total) daily by mouth., Disp: 90 tablet, Rfl: 3 .  Blood Glucose Monitoring Suppl (ACCU-CHEK NANO SMARTVIEW) w/Device KIT, Use glucometer (Accu-Chek Nano Glucometer) to test fasting glucose daily., Disp: 1 kit, Rfl: 0 .  dexlansoprazole (DEXILANT) 60 MG capsule, Take 1 capsule (60 mg total) by mouth daily., Disp: 20 capsule, Rfl: 0 .  empagliflozin (JARDIANCE) 10 MG TABS tablet, Take 10 mg by mouth daily., Disp: 90 tablet, Rfl: 3 .  Ferrous Sulfate (IRON SUPPLEMENT PO), Take 1 tablet by mouth daily., Disp: , Rfl:  .  fluticasone (FLONASE) 50 MCG/ACT nasal spray, Place 2 sprays into both nostrils daily., Disp: 16 g, Rfl: 6 .  Lancets (ONETOUCH ULTRASOFT) lancets, Use as instructed once a day for fasting glucose check., Disp: 100 each, Rfl: 3 .  lidocaine (XYLOCAINE) 2 % solution, Take 5 ml mixed with 5 ml of Mylanta 2-3 times a day as needed for acute acid indigestion., Disp: 100 mL, Rfl: 0 .  metFORMIN (GLUCOPHAGE) 1000 MG tablet, Take 1 tablet (1,000 mg total) 2 (two) times daily with a meal by mouth., Disp: 180 tablet, Rfl: 3 .  Misc Natural  Products (OSTEO BI-FLEX ADV DOUBLE ST) CAPS, Take 1 capsule by mouth daily., Disp: , Rfl:  .  Probiotic Product (PROBIOTIC FORMULA) CAPS, Take by mouth daily., Disp: , Rfl:   Review of Systems  Constitutional: Negative.   Respiratory: Negative.   Cardiovascular: Negative.   Musculoskeletal: Negative for gout.       Wrist pain   Neurological: Positive for numbness (fingers).   Social History   Tobacco Use  . Smoking status: Former Smoker    Types: Cigarettes  . Smokeless tobacco: Never Used  . Tobacco comment: quit in 1978  Substance Use Topics  . Alcohol use: Yes     Alcohol/week: 3.0 - 3.6 oz    Types: 1 - 2 Standard drinks or equivalent, 4 Cans of beer per week   Objective:   BP 118/74 (BP Location: Right Arm, Patient Position: Sitting, Cuff Size: Normal)   Pulse 68   Temp 98.5 F (36.9 C) (Oral)   Wt 255 lb 9.6 oz (115.9 kg)   SpO2 95%   BMI 32.82 kg/m   Physical Exam  Constitutional: He is oriented to person, place, and time. He appears well-developed and well-nourished. No distress.  HENT:  Head: Normocephalic and atraumatic.  Right Ear: Hearing normal.  Left Ear: Hearing normal.  Nose: Nose normal.  Eyes: Conjunctivae and lids are normal. Right eye exhibits no discharge. Left eye exhibits no discharge. No scleral icterus.  Pulmonary/Chest: Effort normal. No respiratory distress.  Musculoskeletal: Normal range of motion. He exhibits tenderness.  Slight tenderness in the volar surface of the right radial wrist. No muscle atrophy. Positive Phalen and Tinel sign. Dupuytren's nodules palm of both hands (right 3rd flexor tendon and left 4th & 5th flexor tendon).  Neurological: He is alert and oriented to person, place, and time.  Skin: Skin is intact. No lesion and no rash noted.  Psychiatric: He has a normal mood and affect. His speech is normal and behavior is normal. Thought content normal.       Assessment & Plan:     1. Carpal tunnel syndrome of right wrist Having numbness in the right thumb, index and middle fingers each morning with pain along the radial side of the right volar wrist over the past month. No known injury. Will treat with NSAID (Aleve) and wrist splint at night. Schedule referral to a hand surgeon for possible injection or need for NCS. Recheck prn. - CBC with Differential/Platelet  2. Dupuytren's disease of palm of both hands No known injury. Contracting flexor tendons of both palms (right 3rd, left 4th & 5th). Some nodule along tendons, also. May use NSAID of choice (OTC) and schedule referral to hand surgeon.  3.  Type 2 diabetes mellitus without complication, without long-term current use of insulin (HCC) States FBS at home has been in the 120 range recently. No polyuria or polydipsia. Continues to tolerate the Metformin 1000 mg BID and Jardiance 10 mg qd. Recheck labs and follow up pending reports. - CBC with Differential/Platelet - Comprehensive metabolic panel - Hemoglobin A1c       Vernie Murders, PA  [facility]

## 2018-02-05 ENCOUNTER — Encounter: Payer: Self-pay | Admitting: Family Medicine

## 2018-02-12 DIAGNOSIS — G5600 Carpal tunnel syndrome, unspecified upper limb: Secondary | ICD-10-CM | POA: Insufficient documentation

## 2018-02-12 DIAGNOSIS — G5601 Carpal tunnel syndrome, right upper limb: Secondary | ICD-10-CM | POA: Diagnosis not present

## 2018-03-05 ENCOUNTER — Encounter: Payer: Self-pay | Admitting: Family Medicine

## 2018-03-05 NOTE — Telephone Encounter (Signed)
Left samples on your desk for patient to come by and pick up.

## 2018-03-10 DIAGNOSIS — G5601 Carpal tunnel syndrome, right upper limb: Secondary | ICD-10-CM | POA: Diagnosis not present

## 2018-03-10 DIAGNOSIS — E119 Type 2 diabetes mellitus without complications: Secondary | ICD-10-CM | POA: Diagnosis not present

## 2018-03-11 LAB — CBC WITH DIFFERENTIAL/PLATELET
BASOS ABS: 0 10*3/uL (ref 0.0–0.2)
BASOS: 1 %
EOS (ABSOLUTE): 0.1 10*3/uL (ref 0.0–0.4)
Eos: 2 %
Hematocrit: 44 % (ref 37.5–51.0)
Hemoglobin: 15.4 g/dL (ref 13.0–17.7)
Immature Grans (Abs): 0 10*3/uL (ref 0.0–0.1)
Immature Granulocytes: 0 %
LYMPHS ABS: 1.4 10*3/uL (ref 0.7–3.1)
LYMPHS: 26 %
MCH: 32.1 pg (ref 26.6–33.0)
MCHC: 35 g/dL (ref 31.5–35.7)
MCV: 92 fL (ref 79–97)
Monocytes Absolute: 0.6 10*3/uL (ref 0.1–0.9)
Monocytes: 11 %
NEUTROS ABS: 3.3 10*3/uL (ref 1.4–7.0)
Neutrophils: 60 %
PLATELETS: 231 10*3/uL (ref 150–450)
RBC: 4.8 x10E6/uL (ref 4.14–5.80)
RDW: 13.9 % (ref 12.3–15.4)
WBC: 5.5 10*3/uL (ref 3.4–10.8)

## 2018-03-11 LAB — COMPREHENSIVE METABOLIC PANEL
A/G RATIO: 2 (ref 1.2–2.2)
ALT: 21 IU/L (ref 0–44)
AST: 15 IU/L (ref 0–40)
Albumin: 4.6 g/dL (ref 3.5–4.8)
Alkaline Phosphatase: 72 IU/L (ref 39–117)
BILIRUBIN TOTAL: 0.9 mg/dL (ref 0.0–1.2)
BUN/Creatinine Ratio: 13 (ref 10–24)
BUN: 12 mg/dL (ref 8–27)
CHLORIDE: 102 mmol/L (ref 96–106)
CO2: 21 mmol/L (ref 20–29)
Calcium: 9.3 mg/dL (ref 8.6–10.2)
Creatinine, Ser: 0.9 mg/dL (ref 0.76–1.27)
GFR calc non Af Amer: 86 mL/min/{1.73_m2} (ref >59)
GFR, EST AFRICAN AMERICAN: 100 mL/min/{1.73_m2} (ref >59)
GLUCOSE: 122 mg/dL — AB (ref 65–99)
Globulin, Total: 2.3 g/dL (ref 1.5–4.5)
POTASSIUM: 4.2 mmol/L (ref 3.5–5.2)
Sodium: 138 mmol/L (ref 134–144)
TOTAL PROTEIN: 6.9 g/dL (ref 6.0–8.5)

## 2018-03-11 LAB — HEMOGLOBIN A1C
Est. average glucose Bld gHb Est-mCnc: 163 mg/dL
HEMOGLOBIN A1C: 7.3 % — AB (ref 4.8–5.6)

## 2018-03-12 ENCOUNTER — Encounter: Payer: Self-pay | Admitting: Family Medicine

## 2018-03-20 DIAGNOSIS — D485 Neoplasm of uncertain behavior of skin: Secondary | ICD-10-CM | POA: Diagnosis not present

## 2018-03-20 DIAGNOSIS — L578 Other skin changes due to chronic exposure to nonionizing radiation: Secondary | ICD-10-CM | POA: Diagnosis not present

## 2018-03-20 DIAGNOSIS — D1801 Hemangioma of skin and subcutaneous tissue: Secondary | ICD-10-CM | POA: Diagnosis not present

## 2018-03-31 DIAGNOSIS — M19031 Primary osteoarthritis, right wrist: Secondary | ICD-10-CM | POA: Diagnosis not present

## 2018-04-07 DIAGNOSIS — M19031 Primary osteoarthritis, right wrist: Secondary | ICD-10-CM | POA: Diagnosis not present

## 2018-04-27 ENCOUNTER — Encounter: Payer: Self-pay | Admitting: Family Medicine

## 2018-04-30 ENCOUNTER — Encounter: Payer: Self-pay | Admitting: Family Medicine

## 2018-05-11 ENCOUNTER — Encounter: Payer: Self-pay | Admitting: Family Medicine

## 2018-05-12 DIAGNOSIS — M7542 Impingement syndrome of left shoulder: Secondary | ICD-10-CM | POA: Diagnosis not present

## 2018-05-13 ENCOUNTER — Telehealth: Payer: Self-pay

## 2018-06-05 ENCOUNTER — Ambulatory Visit (INDEPENDENT_AMBULATORY_CARE_PROVIDER_SITE_OTHER): Payer: Medicare HMO

## 2018-06-05 VITALS — BP 124/64 | HR 64 | Temp 97.6°F | Ht 75.0 in | Wt 249.0 lb

## 2018-06-05 DIAGNOSIS — Z0000 Encounter for general adult medical examination without abnormal findings: Secondary | ICD-10-CM | POA: Diagnosis not present

## 2018-06-05 NOTE — Patient Instructions (Signed)
[name] , Thank you for taking time to come for your Medicare Wellness Visit. I appreciate your ongoing commitment to your health goals. Please review the following plan we discussed and let me know if I can assist you in the future.   Screening recommendations/referrals: Colonoscopy: Up to date Recommended yearly ophthalmology/optometry visit for glaucoma screening and checkup Recommended yearly dental visit for hygiene and checkup  Vaccinations: Influenza vaccine: Pt declines today.  Pneumococcal vaccine: Up to date Tdap vaccine: Up to date Shingles vaccine: Pt declines today.     Advanced directives: Advance directive discussed with you today. Even though you declined this today please call our office should you change your mind and we can give you the proper paperwork for you to fill out.  Conditions/risks identified: Declined any diet changes.   Next appointment: 06/16/18 @ 2 PM with Simona Huh [place]ism[place].   Preventive Care 70 Years and Older, Male Preventive care refers to lifestyle choices and visits with your health care provider that can promote health and wellness. What does preventive care include?  A yearly physical exam. This is also called an annual well check.  Dental exams once or twice a year.  Routine eye exams. Ask your health care provider how often you should have your eyes checked.  Personal lifestyle choices, including:  Daily care of your teeth and gums.  Regular physical activity.  Eating a healthy diet.  Avoiding tobacco and drug use.  Limiting alcohol use.  Practicing safe sex.  Taking low doses of aspirin every day.  Taking vitamin and mineral supplements as recommended by your health care provider. What happens during an annual well check? The services and screenings done by your health care provider during your annual well check will depend on your age, overall health, lifestyle risk factors, and family history of disease. Counseling  Your  health care provider may ask you questions about your:  Alcohol use.  Tobacco use.  Drug use.  Emotional well-being.  Home and relationship well-being.  Sexual activity.  Eating habits.  History of falls.  Memory and ability to understand (cognition).  Work and work Statistician. Screening  You may have the following tests or measurements:  Height, weight, and BMI.  Blood pressure.  Lipid and cholesterol levels. These may be checked every 5 years, or more frequently if you are over 82 years old.  Skin check.  Lung cancer screening. You may have this screening every year starting at age 48 if you have a 30-pack-year history of smoking and currently smoke or have quit within the past 15 years.  Fecal occult blood test (FOBT) of the stool. You may have this test every year starting at age 19.  Flexible sigmoidoscopy or colonoscopy. You may have a sigmoidoscopy every 5 years or a colonoscopy every 10 years starting at age 56.  Prostate cancer screening. Recommendations will vary depending on your family history and other risks.  Hepatitis C blood test.  Hepatitis B blood test.  Sexually transmitted disease (STD) testing.  Diabetes screening. This is done by checking your blood sugar (glucose) after you have not eaten for a while (fasting). You may have this done every 1-3 years.  Abdominal aortic aneurysm (AAA) screening. You may need this if you are a current or former smoker.  Osteoporosis. You may be screened starting at age 46 if you are at high risk. Talk with your health care provider about your test results, treatment options, and if necessary, the need for more tests.  Vaccines  Your health care provider may recommend certain vaccines, such as:  Influenza vaccine. This is recommended every year.  Tetanus, diphtheria, and acellular pertussis (Tdap, Td) vaccine. You may need a Td booster every 10 years.  Zoster vaccine. You may need this after age  35.  Pneumococcal 13-valent conjugate (PCV13) vaccine. One dose is recommended after age 45.  Pneumococcal polysaccharide (PPSV23) vaccine. One dose is recommended after age 34. Talk to your health care provider about which screenings and vaccines you need and how often you need them. This information is not intended to replace advice given to you by your health care provider. Make sure you discuss any questions you have with your health care provider. Document Released: 10/13/2015 Document Revised: 06/05/2016 Document Reviewed: 07/18/2015 Elsevier Interactive Patient Education  2017 [place] Prevention in the Home Falls can cause injuries. They can happen to people of all ages. There are many things you can do to make your home safe and to help prevent falls. What can I do on the outside of my home?  Regularly fix the edges of walkways and driveways and fix any cracks.  Remove anything that might make you trip as you walk through a door, such as a raised step or threshold.  Trim any bushes or trees on the path to your home.  Use bright outdoor lighting.  Clear any walking paths of anything that might make someone trip, such as rocks or tools.  Regularly check to see if handrails are loose or broken. Make sure that both sides of any steps have handrails.  Any raised decks and porches should have guardrails on the edges.  Have any leaves, snow, or ice cleared regularly.  Use sand or salt on walking paths during winter.  Clean up any spills in your garage right away. This includes oil or grease spills. What can I do in the bathroom?  Use night lights.  Install grab bars by the toilet and in the tub and shower. Do not use towel bars as grab bars.  Use non-skid mats or decals in the tub or shower.  If you need to sit down in the shower, use a plastic, non-slip stool.  Keep the floor dry. Clean up any water that spills on the floor as soon as it happens.  Remove  soap buildup in the tub or shower regularly.  Attach bath mats securely with double-sided non-slip rug tape.  Do not have throw rugs and other things on the floor that can make you trip. What can I do in the bedroom?  Use night lights.  Make sure that you have a light by your bed that is easy to reach.  Do not use any sheets or blankets that are too big for your bed. They should not hang down onto the floor.  Have a firm chair that has side arms. You can use this for support while you get dressed.  Do not have throw rugs and other things on the floor that can make you trip. What can I do in the kitchen?  Clean up any spills right away.  Avoid walking on wet floors.  Keep items that you use a lot in easy-to-reach places.  If you need to reach something above you, use a strong step stool that has a grab bar.  Keep electrical cords out of the way.  Do not use floor polish or wax that makes floors slippery. If you must use wax, use non-skid floor wax.  Do not have throw rugs and other things on the floor that can make you trip. What can I do with my stairs?  Do not leave any items on the stairs.  Make sure that there are handrails on both sides of the stairs and use them. Fix handrails that are broken or loose. Make sure that handrails are as long as the stairways.  Check any carpeting to make sure that it is firmly attached to the stairs. Fix any carpet that is loose or worn.  Avoid having throw rugs at the top or bottom of the stairs. If you do have throw rugs, attach them to the floor with carpet tape.  Make sure that you have a light switch at the top of the stairs and the bottom of the stairs. If you do not have them, ask someone to add them for you. What else can I do to help prevent falls?  Wear shoes that:  Do not have high heels.  Have rubber bottoms.  Are comfortable and fit you well.  Are closed at the toe. Do not wear sandals.  If you use a  stepladder:  Make sure that it is fully opened. Do not climb a closed stepladder.  Make sure that both sides of the stepladder are locked into place.  Ask someone to hold it for you, if possible.  Clearly mark and make sure that you can see:  Any grab bars or handrails.  First and last steps.  Where the edge of each step is.  Use tools that help you move around (mobility aids) if they are needed. These include:  Canes.  Walkers.  Scooters.  Crutches.  Turn on the lights when you go into a dark area. Replace any light bulbs as soon as they burn out.  Set up your furniture so you have a clear path. Avoid moving your furniture around.  If any of your floors are uneven, fix them.  If there are any pets around you, be aware of where they are.  Review your medicines with your doctor. Some medicines can make you feel dizzy. This can increase your chance of falling. Ask your doctor what other things that you can do to help prevent falls. This information is not intended to replace advice given to you by your health care provider. Make sure you discuss any questions you have with your health care provider. Document Released: 07/13/2009 Document Revised: 02/22/2016 Document Reviewed: 10/21/2014 Elsevier Interactive Patient Education  2017 [employer].

## 2018-06-05 NOTE — Progress Notes (Addendum)
Subjective:   Steven Miranda is a 72 y.o. male who presents for Medicare Annual/Subsequent preventive examination.  Review of Systems:  N/A  Cardiac Risk Factors include: advanced age (>88mn, >>22women);diabetes mellitus;dyslipidemia;hypertension;male gender;obesity (BMI >30kg/m2)     Objective:    Vitals: BP 124/64 (BP Location: Right Arm)   Pulse 64   Temp 97.6 F (36.4 C) (Oral)   Ht 6' 3" (1.905 m)   Wt 249 lb (112.9 kg)   BMI 31.12 kg/m   Body mass index is 31.12 kg/m.  Advanced Directives 06/05/2018 10/26/2017 04/29/2017 05/10/2016 03/30/2015  Does Patient Have a Medical Advance Directive? _0   Would patient like information on creating a medical advance directive? No - Patient declined - Yes (ED - Information included in AVS) - No - patient declined information    Tobacco Social History   Tobacco Use  Smoking Status Former Smoker  . Types: Cigarettes  Smokeless Tobacco Never Used  Tobacco Comment   quit in 1978     Counseling given: Not Answered Comment: quit in 1978   Clinical Intake:  Pre-visit preparation completed: Yes  Pain : No/denies pain Pain Score: 0-No pain     Nutritional Status: BMI > 30  Obese Nutritional Risks: None Diabetes: Yes(type 2) CBG done?: No Did pt. bring in CBG monitor from home?: No  How often do you need to have someone help you when you read instructions, pamphlets, or other written materials from your doctor or pharmacy?: 1 - Never  Interpreter Needed?: No  Information entered by :: MMarkoski, LPN  Past Medical History:  Diagnosis Date  . Broken wrist   . [place]   . Dysuria   . Hyperlipidemia   . Hypertension    Past Surgical History:  Procedure Laterality Date  . COLONOSCOPY  2005  . COLONOSCOPY WITH PROPOFOL N/A 10/16/2016   Procedure: COLONOSCOPY WITH PROPOFOL;  Surgeon: JRobert Bellow MD;  [facility];  Service: Endoscopy;  Laterality: N/A;    . STRABISMUS SURGERY Right    Family History  Problem Relation Age of Onset  . Cancer Mother   . Alcohol abuse Father   . Heart disease Maternal Uncle   . Rheumatic fever Maternal Uncle   . Diabetes Paternal Grandmother   . Emphysema Paternal Grandfather    Social History   Socioeconomic History  . Marital status: Married    Spouse name: Not on file  . Number of children: 3  . Years of education: Not on file  . Highest education level: Bachelor's degree (e.g., BA, AB, BS)  Occupational History  . Not on file  Social Needs  . Financial resource strain: Not hard at all  . Food insecurity:    Worry: Never true    Inability: Never true  . Transportation needs:    Medical: No    Non-medical: No  Tobacco Use  . Smoking status: Former Smoker    Types: Cigarettes  . Smokeless tobacco: Never Used  . Tobacco comment: quit in 1978  Substance and Sexual Activity  . Alcohol use: Yes    Alcohol/week: 4.0 standard drinks    Types: 4 Cans of beer per week  . Drug use: No  . Sexual activity: Not on file  Lifestyle  . Physical activity:    Days per week: Not on file    Minutes per session: Not on file  . Stress: Not at all  Relationships  . Social  connections:    Talks on phone: Not on file    Gets together: Not on file    Attends religious service: Not on file    Active member of club or organization: Not on file    Attends meetings of clubs or organizations: Not on file    Relationship status: Not on file  Other Topics Concern  . Not on file  Social History Narrative  . Not on file    Outpatient Encounter Medications as of 06/05/2018  Medication Sig  . ACCU-CHEK SMARTVIEW test strip CHECK  FASTING  BLOOD  SUGAR EVERY MORNING AS INSTRUCTED  . amLODipine (NORVASC) 5 MG tablet Take 1 tablet (5 mg total) at bedtime by mouth.  Marland Kitchen aspirin 81 MG tablet Take 1 tablet by mouth daily.  Marland Kitchen atorvastatin (LIPITOR) 20 MG tablet Take 1 tablet (20 mg total) daily by mouth.  . Blood  Glucose Monitoring Suppl (ACCU-CHEK NANO SMARTVIEW) w/Device KIT Use glucometer (Accu-Chek Nano Glucometer) to test fasting glucose daily.  Marland Kitchen dexlansoprazole (DEXILANT) 60 MG capsule Take 1 capsule (60 mg total) by mouth daily.  . empagliflozin (JARDIANCE) 10 MG TABS tablet Take 10 mg by mouth daily.  . Ferrous Sulfate (IRON SUPPLEMENT PO) Take 1 tablet by mouth daily.  . fluticasone (FLONASE) 50 MCG/ACT nasal spray Place 2 sprays into both nostrils daily. (Patient taking differently: Place 2 sprays into both nostrils daily. )  . Lancets (ONETOUCH ULTRASOFT) lancets Use as instructed once a day for fasting glucose check.  . lidocaine (XYLOCAINE) 2 % solution Take 5 ml mixed with 5 ml of Mylanta 2-3 times a day as needed for acute acid indigestion.  . metFORMIN (GLUCOPHAGE) 1000 MG tablet Take 1 tablet (1,000 mg total) 2 (two) times daily with a meal by mouth.  . Misc Natural Products (OSTEO BI-FLEX ADV DOUBLE ST) CAPS Take 1 capsule by mouth daily.  . Probiotic Product (PROBIOTIC FORMULA) CAPS Take by mouth daily.  . meloxicam (MOBIC) 15 MG tablet meloxicam 15 mg tablet  TAKE 1 TABLET BY MOUTH EVERY DAY   No facility-administered encounter medications on file as of 06/05/2018.     Activities of Daily Living In your present state of health, do you have any difficulty performing the following activities: 06/05/2018  Hearing? N  Vision? N  Difficulty concentrating or making decisions? N  Walking or climbing stairs? N  Dressing or bathing? N  Doing errands, shopping? N  Preparing Food and eating ? N  Using the Toilet? N  In the past six months, have you accidently leaked urine? N  Do you have problems with loss of bowel control? N  Managing your Medications? N  Managing your Finances? N  Housekeeping or managing your Housekeeping? N  Some recent data might be hidden    Patient Care Team: Chrismon, Vickki Muff, PA as PCP - General (Physician Assistant) Bary Castilla, Forest Gleason, MD (General  Surgery) Rutherford Limerick, MD as Consulting Physician (Orthopedic Surgery)   Assessment:   This is a routine wellness examination for Steven Miranda.  Exercise Activities and Dietary recommendations Current Exercise Habits: Structured exercise class, Type of exercise: treadmill;walking;strength training/weights;stretching, Time (Minutes): 45, Frequency (Times/Week): 3(to 4 times a week), Weekly Exercise (Minutes/Week): 135, Intensity: Moderate  Goals    . Reduce portion size     Recommend decreasing portion sizes and eating 3 meals a day and 2 healthy snacks in between.        Fall Risk Fall Risk  06/05/2018 04/29/2017 05/10/2016 03/30/2015  Falls in the past year? No No No No   Is the patient's home free of loose throw rugs in walkways, pet beds, electrical cords, etc?   yes      Grab bars in the bathroom? no      Handrails on the stairs?   yes      Adequate lighting?   yes  Timed Get Up and Go Performed: N/A  Depression Screen PHQ 2/9 Scores 06/05/2018 06/05/2018 04/29/2017 05/10/2016  PHQ - 2 Score 0 0 0 0  PHQ- 9 Score 0 - - -    Cognitive Function: Pt declined screening today.         Immunization History  Administered Date(s) Administered  . Hepatitis A 02/02/2004  . Influenza, High Dose Seasonal PF 07/08/2014  . Influenza,inj,Quad PF,6+ Mos 07/23/2013  . Influenza-Unspecified 06/15/2015, 06/30/2017  . Pneumococcal Conjugate-13 05/10/2016  . Pneumococcal Polysaccharide-23 07/18/2017  . Tdap 08/22/2009    Qualifies for Shingles Vaccine? Due for Shingles vaccine. Declined my offer to administer today. Education has been provided regarding the importance of this vaccine. Pt has been advised to call her insurance company to determine her out of pocket expense. Advised she may also receive this vaccine at her local pharmacy or Health Dept. Verbalized acceptance and understanding.  Screening Tests Health Maintenance  Topic Date Due  . Hepatitis C Screening  1945-12-04  .  OPHTHALMOLOGY EXAM  06/25/2016  . FOOT EXAM  02/03/2018  . INFLUENZA VACCINE  04/30/2018  . URINE MICROALBUMIN  05/06/2018  . HEMOGLOBIN A1C  09/09/2018  . TETANUS/TDAP  08/23/2019  . COLONOSCOPY  10/16/2026  . PNA vac Low Risk Adult  Completed   Cancer Screenings: Lung: Low Dose CT Chest recommended if Age 36-80 years, 30 pack-year currently smoking OR have quit w/in 15years. Patient does not qualify. Colorectal: Up to date  Additional Screenings:  Hepatitis C Screening: Pt declines today.       Plan:  I have personally reviewed and addressed the Medicare Annual Wellness questionnaire and have noted the following in the patient's chart:  A. Medical and social history B. Use of alcohol, tobacco or illicit drugs  C. Current medications and supplements D. Functional ability and status E.  Nutritional status F.  Physical activity G. Advance directives H. List of other physicians I.  Hospitalizations, surgeries, and ER visits in previous 12 months J.  [place] such as hearing and vision if needed, cognitive and depression L. Referrals and appointments - none  In addition, I have reviewed and discussed with patient certain preventive protocols, quality metrics, and best practice recommendations. A written personalized care plan for preventive services as well as general preventive health recommendations were provided to patient.  See attached scanned questionnaire for additional information.   Signed,  Fabio Neighbors, LPN Nurse Health Advisor   Nurse Recommendations: Pt needs a urine check and foot exam at next OV. Pt has an eye exam scheduled for 06/2018. Pt declined the Hep C lab order today.   Reviewed documentation of wellness screening by Nurse Health Advisor. Was available for consultation and agree with recommendations.

## 2018-06-11 ENCOUNTER — Ambulatory Visit: Payer: Self-pay

## 2018-06-11 ENCOUNTER — Encounter: Payer: Self-pay | Admitting: Family Medicine

## 2018-06-15 ENCOUNTER — Ambulatory Visit: Payer: Self-pay

## 2018-06-16 ENCOUNTER — Ambulatory Visit (INDEPENDENT_AMBULATORY_CARE_PROVIDER_SITE_OTHER): Payer: Medicare HMO | Admitting: Family Medicine

## 2018-06-16 ENCOUNTER — Encounter: Payer: Self-pay | Admitting: Family Medicine

## 2018-06-16 VITALS — BP 118/78 | HR 67 | Temp 97.9°F | Resp 16 | Ht 75.0 in | Wt 250.0 lb

## 2018-06-16 DIAGNOSIS — Z125 Encounter for screening for malignant neoplasm of prostate: Secondary | ICD-10-CM

## 2018-06-16 DIAGNOSIS — R195 Other fecal abnormalities: Secondary | ICD-10-CM

## 2018-06-16 DIAGNOSIS — E785 Hyperlipidemia, unspecified: Secondary | ICD-10-CM

## 2018-06-16 DIAGNOSIS — Z1159 Encounter for screening for other viral diseases: Secondary | ICD-10-CM

## 2018-06-16 DIAGNOSIS — E119 Type 2 diabetes mellitus without complications: Secondary | ICD-10-CM | POA: Diagnosis not present

## 2018-06-16 DIAGNOSIS — I10 Essential (primary) hypertension: Secondary | ICD-10-CM

## 2018-06-16 DIAGNOSIS — K30 Functional dyspepsia: Secondary | ICD-10-CM

## 2018-06-16 LAB — POCT UA - MICROALBUMIN: MICROALBUMIN (UR) POC: 20 mg/L

## 2018-06-16 NOTE — Progress Notes (Signed)
Patient: Steven Miranda, Male    DOB: 1946-06-07, 72 y.o.   MRN: [id number] Visit Date: 06/16/2018  Today's Provider: Vernie Murders, PA   Chief Complaint  Patient presents with  . Annual Exam   Subjective:     Complete Physical [name] is a 72 y.o. male. He feels well. He reports exercising 3-4x a week for 30-55mn. He reports he is sleeping well.  -----------------------------------------------------------   Review of Systems  Constitutional: Negative.   HENT: Negative.   Eyes: Negative.   Respiratory: Negative.   Cardiovascular: Negative.   Gastrointestinal: Negative.   Endocrine: Negative.   Genitourinary: Negative.   Musculoskeletal: Negative.   Allergic/Immunologic: Negative.   All other systems reviewed and are negative.   Social History   Socioeconomic History  . Marital status: Married    Spouse name: Not on file  . Number of children: 3  . Years of education: Not on file  . Highest education level: Bachelor's degree (e.g., BA, AB, BS)  Occupational History  . Not on file  Social Needs  . Financial resource strain: Not hard at all  . Food insecurity:    Worry: Never true    Inability: Never true  . Transportation needs:    Medical: No    Non-medical: No  Tobacco Use  . Smoking status: Former Smoker    Types: Cigarettes  . Smokeless tobacco: Never Used  . Tobacco comment: quit in 1978  Substance and Sexual Activity  . Alcohol use: Yes    Alcohol/week: 4.0 standard drinks    Types: 4 Cans of beer per week  . Drug use: No  . Sexual activity: Not on file  Lifestyle  . Physical activity:    Days per week: Not on file    Minutes per session: Not on file  . Stress: Not at all  Relationships  . Social connections:    Talks on phone: Not on file    Gets together: Not on file    Attends religious service: Not on file    Active member of club or organization: Not on file    Attends meetings of clubs or organizations: Not on file      Relationship status: Not on file  . Intimate partner violence:    Fear of current or ex partner: Not on file    Emotionally abused: Not on file    Physically abused: Not on file    Forced sexual activity: Not on file  Other Topics Concern  . Not on file  Social History Narrative  . Not on file    Past Medical History:  Diagnosis Date  . Broken wrist   . [place]   . Dysuria   . Hyperlipidemia   . Hypertension      Patient Active Problem List   Diagnosis Date Noted  . Shoulder stiffness 11/25/2017  . Shoulder joint pain 06/06/2017  . Abdominal pain, left lower quadrant 08/12/2016  . Acid indigestion 02/02/2015  . Essential (primary) hypertension 02/02/2015  . Injury of tendon of rotator cuff 02/02/2015  . Diabetes mellitus, type 2 ([place] 02/02/2015  . HLD (hyperlipidemia) 05/19/2007    Past Surgical History:  Procedure Laterality Date  . COLONOSCOPY  2005  . COLONOSCOPY WITH PROPOFOL N/A 10/16/2016   Procedure: COLONOSCOPY WITH PROPOFOL;  Surgeon: JRobert Bellow MD;  [facility];  Service: Endoscopy;  Laterality: N/A;  . STRABISMUS SURGERY Right  His family history includes Alcohol abuse in his father; Cancer in his mother; Diabetes in his paternal grandmother; Emphysema in his paternal grandfather; Heart disease in his maternal uncle; Rheumatic fever in his maternal uncle.      Current Outpatient Medications:  .  ACCU-CHEK SMARTVIEW test strip, CHECK  FASTING  BLOOD  SUGAR EVERY MORNING AS INSTRUCTED, Disp: 100 each, Rfl: 4 .  amLODipine (NORVASC) 5 MG tablet, Take 1 tablet (5 mg total) at bedtime by mouth., Disp: 90 tablet, Rfl: 3 .  aspirin 81 MG tablet, Take 1 tablet by mouth daily., Disp: , Rfl:  .  atorvastatin (LIPITOR) 20 MG tablet, Take 1 tablet (20 mg total) daily by mouth., Disp: 90 tablet, Rfl: 3 .  Blood Glucose Monitoring Suppl (ACCU-CHEK NANO SMARTVIEW) w/Device KIT, Use glucometer (Accu-Chek Nano  Glucometer) to test fasting glucose daily., Disp: 1 kit, Rfl: 0 .  dexlansoprazole (DEXILANT) 60 MG capsule, Take 1 capsule (60 mg total) by mouth daily., Disp: 20 capsule, Rfl: 0 .  empagliflozin (JARDIANCE) 10 MG TABS tablet, Take 10 mg by mouth daily., Disp: 90 tablet, Rfl: 3 .  Ferrous Sulfate (IRON SUPPLEMENT PO), Take 1 tablet by mouth daily., Disp: , Rfl:  .  fluticasone (FLONASE) 50 MCG/ACT nasal spray, Place 2 sprays into both nostrils daily. (Patient taking differently: Place 2 sprays into both nostrils daily. ), Disp: 16 g, Rfl: 6 .  Lancets (ONETOUCH ULTRASOFT) lancets, Use as instructed once a day for fasting glucose check., Disp: 100 each, Rfl: 3 .  lidocaine (XYLOCAINE) 2 % solution, Take 5 ml mixed with 5 ml of Mylanta 2-3 times a day as needed for acute acid indigestion., Disp: 100 mL, Rfl: 0 .  meloxicam (MOBIC) 15 MG tablet, meloxicam 15 mg tablet  TAKE 1 TABLET BY MOUTH EVERY DAY, Disp: , Rfl:  .  metFORMIN (GLUCOPHAGE) 1000 MG tablet, Take 1 tablet (1,000 mg total) 2 (two) times daily with a meal by mouth., Disp: 180 tablet, Rfl: 3 .  Misc Natural Products (OSTEO BI-FLEX ADV DOUBLE ST) CAPS, Take 1 capsule by mouth daily., Disp: , Rfl:  .  Probiotic Product (PROBIOTIC FORMULA) CAPS, Take by mouth daily., Disp: , Rfl:   Patient Care Team: Chrismon, Vickki Muff, PA as PCP - General (Physician Assistant) Robert Bellow, MD (General Surgery) Rutherford Limerick, MD as Consulting Physician (Orthopedic Surgery)     Objective:   Vitals: BP 118/78   Pulse 67   Temp 97.9 F (36.6 C) (Oral)   Resp 16   Ht 6' 3" (1.905 m)   Wt 250 lb (113.4 kg)   SpO2 93%   BMI 31.25 kg/m   Physical Exam  Constitutional: He is oriented to person, place, and time. He appears well-developed and well-nourished.  HENT:  Head: Normocephalic and atraumatic.  Right Ear: External ear normal.  Left Ear: External ear normal.  Nose: Nose normal.  Mouth/Throat: Oropharynx is clear and  moist.  Eyes: Pupils are equal, round, and reactive to light. Conjunctivae and EOM are normal. Right eye exhibits no discharge.  Strabismus right eye - turned out.  Neck: Normal range of motion. Neck supple. No tracheal deviation present. No thyromegaly present.  Cardiovascular: Normal rate, regular rhythm, normal heart sounds and intact distal pulses.  No murmur heard. Pulmonary/Chest: Effort normal and breath sounds normal. No respiratory distress. He has no wheezes. He has no rales. He exhibits no tenderness.  Abdominal: Soft. He exhibits no distension and no mass. There is no  tenderness. There is no rebound and no guarding.  Genitourinary: Prostate normal and penis normal. Rectal exam shows guaiac positive stool.  Musculoskeletal: Normal range of motion. He exhibits no edema or tenderness.  Lymphadenopathy:    He has no cervical adenopathy.  Neurological: He is alert and oriented to person, place, and time. He has normal reflexes. He displays normal reflexes. No cranial nerve deficit. He exhibits normal muscle tone. Coordination normal.  Skin: Skin is warm and dry. No rash noted. No erythema.  Psychiatric: He has a normal mood and affect. His behavior is normal. Judgment and thought content normal.   Diabetic Foot Form - Detailed   Diabetic Foot Exam - detailed Diabetic Foot exam was performed with the following findings:  Yes 06/16/2018  3:17 PM  Visual Foot Exam completed.:  Yes  Can the patient see the bottom of their feet?:  Yes Are the shoes appropriate in style and fit?:  Yes Is there swelling or and abnormal foot shape?:  No Is there a claw toe deformity?:  No Is there elevated skin temparature?:  No Is there foot or ankle muscle weakness?:  No Normal Range of Motion:  Yes Pulse Foot Exam completed.:  Yes  Right posterior Tibialias:  Present Left posterior Tibialias:  Present  Right Dorsalis Pedis:  Present Left Dorsalis Pedis:  Present  Sensory Foot Exam Completed.:   Yes Semmes-Weinstein Monofilament Test R Site 1-Great Toe:  Pos L Site 1-Great Toe:  Pos        Activities of Daily Living In your present state of health, do you have any difficulty performing the following activities: 06/05/2018  Hearing? N  Vision? N  Difficulty concentrating or making decisions? N  Walking or climbing stairs? N  Dressing or bathing? N  Doing errands, shopping? N  Preparing Food and eating ? N  Using the Toilet? N  In the past six months, have you accidently leaked urine? N  Do you have problems with loss of bowel control? N  Managing your Medications? N  Managing your Finances? N  Housekeeping or managing your Housekeeping? N  Some recent data might be hidden    Fall Risk Assessment Fall Risk  06/05/2018 04/29/2017 05/10/2016 03/30/2015  Falls in the past year? No No No No     Depression Screen PHQ 2/9 Scores 06/05/2018 06/05/2018 04/29/2017 05/10/2016  PHQ - 2 Score 0 0 0 0  PHQ- 9 Score 0 - - -    Cognitive Testing - 6-CIT  Correct? Score   What year is it? yes 0 0 or 4  What month is it? yes 0 0 or 3  Memorize:    Pia Mau,  44,  [address],  [place],      What time is it? (within 1 hour) yes 0 0 or 3  Count backwards from 20 yes 0 0, 2, or 4  Name the months of the year yes 0 0, 2, or 4  Repeat name & address above no 2 0, 2, 4, 6, 8, or 10       TOTAL SCORE  0/28   Interpretation:  Normal  Normal (0-7) Abnormal (8-28)    Assessment & Plan:    Annual Physical Reviewed patient's Family Medical History Reviewed and updated list of patient's medical providers Assessment of cognitive impairment was done Assessed patient's functional ability Established a written schedule for health screening [place] Completed and Reviewed  Exercise Activities and Dietary recommendations Goals    .  Reduce portion size     Recommend decreasing portion sizes and eating 3 meals a day and 2 healthy snacks in between.        Immunization  History  Administered Date(s) Administered  . Hepatitis A 02/02/2004  . Influenza, High Dose Seasonal PF 07/08/2014  . Influenza,inj,Quad PF,6+ Mos 07/23/2013  . Influenza-Unspecified 06/15/2015, 06/30/2017  . Pneumococcal Conjugate-13 05/10/2016  . Pneumococcal Polysaccharide-23 07/18/2017  . Tdap 08/22/2009    Health Maintenance  Topic Date Due  . Hepatitis C Screening  08-Aug-1946  . OPHTHALMOLOGY EXAM  06/25/2016  . FOOT EXAM  02/03/2018  . INFLUENZA VACCINE  04/30/2018  . URINE MICROALBUMIN  05/06/2018  . HEMOGLOBIN A1C  09/09/2018  . TETANUS/TDAP  08/23/2019  . COLONOSCOPY  10/16/2026  . PNA vac Low Risk Adult  Completed    Discussed health benefits of physical activity, and encouraged him to engage in regular exercise appropriate for his age and condition.    ------------------------------------------------------------------------------------------------------------ 1. Type 2 diabetes mellitus without complication, without long-term current use of insulin (HCC) FBS in the 130's range on average at home. Tolerating the Jardiance 10 mg qd and Metformin 1000 mg BID without side effects. Hgb A1C was 7.3% on 03-12-18. Plans ophthalmology exam the end of this month and foot exam normal today. Urine microalbumin level 20 mg/L today. Prefers no additional medications. Will recheck routine labs and follow up pending reports. - Comprehensive metabolic panel - CBC with Differential/Platelet - Lipid panel - Hemoglobin A1c - POCT UA - Microalbumin  2. Essential (primary) hypertension Well controlled BP on the Amlodipine 5 mg qd. Continue sodium restriction and recheck routine labs. No side effects from medications. - Comprehensive metabolic panel - CBC with Differential/Platelet - Lipid panel - TSH  3. Hyperlipidemia, unspecified hyperlipidemia type Tolerating Atorvastatin 20 mg qd, exercising 30-60 minutes 4 days a week and trying to follow a low fat diet. Recheck CMP, Lipid Panel  and TSH. Continue present statin dosage and follow up pending lab reports. - Comprehensive metabolic panel - Lipid panel - TSH  4. Acid indigestion No significant dyspepsia with use of Dexilant. No melena, hematemesis or hematochezia. If epigastric pain or recurrence of dyspepsia, may need upper endoscopy evaluation. - CBC with Differential/Platelet  5. Need for hepatitis C screening test - Hepatitis C antibody  6. Screening PSA (prostate specific antigen) No nodules on DRE today. Recheck labs. - Comprehensive metabolic panel - PSA  7. Occult blood in stools Colonoscopy 10-16-16 with one tubular adenomatous polyp per Dr. Bary Castilla (surgeon) and recommended repeat test in 5 years (2023). States he has had some flare of hemorrhoids in the past few day. No hematochezia this week. Recommend recheck by [facility] to confirm blood on hemoccult slide today.    Vernie Murders, PA  [facility]

## 2018-06-17 ENCOUNTER — Encounter: Payer: Self-pay | Admitting: Family Medicine

## 2018-06-17 DIAGNOSIS — Z1159 Encounter for screening for other viral diseases: Secondary | ICD-10-CM | POA: Diagnosis not present

## 2018-06-17 DIAGNOSIS — E785 Hyperlipidemia, unspecified: Secondary | ICD-10-CM | POA: Diagnosis not present

## 2018-06-17 DIAGNOSIS — K30 Functional dyspepsia: Secondary | ICD-10-CM | POA: Diagnosis not present

## 2018-06-17 DIAGNOSIS — I10 Essential (primary) hypertension: Secondary | ICD-10-CM | POA: Diagnosis not present

## 2018-06-17 DIAGNOSIS — Z125 Encounter for screening for malignant neoplasm of prostate: Secondary | ICD-10-CM | POA: Diagnosis not present

## 2018-06-17 DIAGNOSIS — E119 Type 2 diabetes mellitus without complications: Secondary | ICD-10-CM | POA: Diagnosis not present

## 2018-06-18 ENCOUNTER — Telehealth: Payer: Self-pay

## 2018-06-18 LAB — CBC WITH DIFFERENTIAL/PLATELET
BASOS ABS: 0 10*3/uL (ref 0.0–0.2)
Basos: 0 %
EOS (ABSOLUTE): 0.1 10*3/uL (ref 0.0–0.4)
Eos: 1 %
HEMOGLOBIN: 14.2 g/dL (ref 13.0–17.7)
Hematocrit: 40.7 % (ref 37.5–51.0)
IMMATURE GRANULOCYTES: 1 %
Immature Grans (Abs): 0 10*3/uL (ref 0.0–0.1)
LYMPHS ABS: 1.1 10*3/uL (ref 0.7–3.1)
Lymphs: 25 %
MCH: 32.3 pg (ref 26.6–33.0)
MCHC: 34.9 g/dL (ref 31.5–35.7)
MCV: 93 fL (ref 79–97)
MONOCYTES: 10 %
MONOS ABS: 0.4 10*3/uL (ref 0.1–0.9)
NEUTROS PCT: 63 %
Neutrophils Absolute: 2.7 10*3/uL (ref 1.4–7.0)
Platelets: 217 10*3/uL (ref 150–450)
RBC: 4.4 x10E6/uL (ref 4.14–5.80)
RDW: 12.9 % (ref 12.3–15.4)
WBC: 4.3 10*3/uL (ref 3.4–10.8)

## 2018-06-18 LAB — COMPREHENSIVE METABOLIC PANEL
A/G RATIO: 2 (ref 1.2–2.2)
ALK PHOS: 73 IU/L (ref 39–117)
ALT: 18 IU/L (ref 0–44)
AST: 14 IU/L (ref 0–40)
Albumin: 4.3 g/dL (ref 3.5–4.8)
BUN/Creatinine Ratio: 16 (ref 10–24)
BUN: 14 mg/dL (ref 8–27)
Bilirubin Total: 0.5 mg/dL (ref 0.0–1.2)
CHLORIDE: 102 mmol/L (ref 96–106)
CO2: 24 mmol/L (ref 20–29)
Calcium: 9.1 mg/dL (ref 8.6–10.2)
Creatinine, Ser: 0.85 mg/dL (ref 0.76–1.27)
GFR calc Af Amer: 102 mL/min/{1.73_m2} (ref >59)
GFR calc non Af Amer: 88 mL/min/{1.73_m2} (ref >59)
GLOBULIN, TOTAL: 2.1 g/dL (ref 1.5–4.5)
Glucose: 115 mg/dL — ABNORMAL HIGH (ref 65–99)
POTASSIUM: 4.4 mmol/L (ref 3.5–5.2)
SODIUM: 139 mmol/L (ref 134–144)
Total Protein: 6.4 g/dL (ref 6.0–8.5)

## 2018-06-18 LAB — HEMOGLOBIN A1C
Est. average glucose Bld gHb Est-mCnc: 169 mg/dL
Hgb A1c MFr Bld: 7.5 % — ABNORMAL HIGH (ref 4.8–5.6)

## 2018-06-18 LAB — PSA: Prostate Specific Ag, Serum: 4 ng/mL (ref 0.0–4.0)

## 2018-06-18 LAB — TSH: TSH: 3.34 u[IU]/mL (ref 0.450–4.500)

## 2018-06-18 LAB — LIPID PANEL
CHOLESTEROL TOTAL: 139 mg/dL (ref 100–199)
Chol/HDL Ratio: 2.3 ratio (ref 0.0–5.0)
HDL: 60 mg/dL (ref >39)
LDL Calculated: 69 mg/dL (ref 0–99)
TRIGLYCERIDES: 49 mg/dL (ref 0–149)
VLDL Cholesterol Cal: 10 mg/dL (ref 5–40)

## 2018-06-18 LAB — HEPATITIS C ANTIBODY: Hep C Virus Ab: <0.1 s/co ratio (ref 0.0–0.9)

## 2018-06-18 NOTE — Telephone Encounter (Signed)
Pt calling for lab results. Please call pt back to discuss.  Thanks, [employer]

## 2018-06-18 NOTE — Telephone Encounter (Signed)
Pt advised.   Thanks,   -Laura  

## 2018-06-18 NOTE — Telephone Encounter (Signed)
-----   Message from [employer], [place] sent at 06/18/2018  7:59 AM EDT ----- Blood sugar better that day but Hgb A1C slightly more than 3 months ago (but much better than last year). Continue present medications and recheck progress in 4 months.

## 2018-06-18 NOTE — Telephone Encounter (Signed)
LMTCB 06/18/2018  Thanks,   -Mickel Baas

## 2018-06-18 NOTE — Telephone Encounter (Signed)
Pt returned call ° °tp °

## 2018-06-24 DIAGNOSIS — E119 Type 2 diabetes mellitus without complications: Secondary | ICD-10-CM | POA: Diagnosis not present

## 2018-07-03 ENCOUNTER — Encounter: Payer: Self-pay | Admitting: Emergency Medicine

## 2018-07-03 ENCOUNTER — Emergency Department
Admission: EM | Admit: 2018-07-03 | Discharge: 2018-07-03 | Disposition: A | Discharge status: Home / Self Care | Payer: Medicare HMO | Attending: Emergency Medicine | Admitting: Emergency Medicine

## 2018-07-03 ENCOUNTER — Other Ambulatory Visit: Payer: Self-pay

## 2018-07-03 ENCOUNTER — Emergency Department: Payer: Medicare HMO

## 2018-07-03 DIAGNOSIS — R079 Chest pain, unspecified: Secondary | ICD-10-CM | POA: Diagnosis present

## 2018-07-03 DIAGNOSIS — E119 Type 2 diabetes mellitus without complications: Secondary | ICD-10-CM | POA: Insufficient documentation

## 2018-07-03 DIAGNOSIS — Z7984 Long term (current) use of oral hypoglycemic drugs: Secondary | ICD-10-CM | POA: Insufficient documentation

## 2018-07-03 DIAGNOSIS — R0789 Other chest pain: Secondary | ICD-10-CM | POA: Diagnosis not present

## 2018-07-03 DIAGNOSIS — K219 Gastro-esophageal reflux disease without esophagitis: Secondary | ICD-10-CM

## 2018-07-03 DIAGNOSIS — Z79899 Other long term (current) drug therapy: Secondary | ICD-10-CM | POA: Insufficient documentation

## 2018-07-03 DIAGNOSIS — Z87891 Personal history of nicotine dependence: Secondary | ICD-10-CM | POA: Diagnosis not present

## 2018-07-03 DIAGNOSIS — I10 Essential (primary) hypertension: Secondary | ICD-10-CM | POA: Insufficient documentation

## 2018-07-03 DIAGNOSIS — Z7982 Long term (current) use of aspirin: Secondary | ICD-10-CM | POA: Diagnosis not present

## 2018-07-03 LAB — CBC WITH DIFFERENTIAL/PLATELET
Basophils Absolute: 0 10*3/uL (ref 0–0.1)
Basophils Relative: 1 %
EOS PCT: 1 %
Eosinophils Absolute: 0.1 10*3/uL (ref 0–0.7)
HCT: 41.7 % (ref 40.0–52.0)
Hemoglobin: 15.2 g/dL (ref 13.0–18.0)
LYMPHS ABS: 1.4 10*3/uL (ref 1.0–3.6)
LYMPHS PCT: 19 %
MCH: 33.8 pg (ref 26.0–34.0)
MCHC: 36.4 g/dL — ABNORMAL HIGH (ref 32.0–36.0)
MCV: 92.8 fL (ref 80.0–100.0)
Monocytes Absolute: 0.7 10*3/uL (ref 0.2–1.0)
Monocytes Relative: 9 %
Neutro Abs: 5.5 10*3/uL (ref 1.4–6.5)
Neutrophils Relative %: 70 %
PLATELETS: 263 10*3/uL (ref 150–440)
RBC: 4.49 MIL/uL (ref 4.40–5.90)
RDW: 13.8 % (ref 11.5–14.5)
WBC: 7.8 10*3/uL (ref 3.8–10.6)

## 2018-07-03 LAB — COMPREHENSIVE METABOLIC PANEL
ALK PHOS: 73 U/L (ref 38–126)
ALT: 19 U/L (ref 0–44)
AST: 17 U/L (ref 15–41)
Albumin: 4.4 g/dL (ref 3.5–5.0)
Anion gap: 8 (ref 5–15)
BILIRUBIN TOTAL: 0.7 mg/dL (ref 0.3–1.2)
BUN: 16 mg/dL (ref 8–23)
CALCIUM: 9.3 mg/dL (ref 8.9–10.3)
CO2: 25 mmol/L (ref 22–32)
Chloride: 104 mmol/L (ref 98–111)
Creatinine, Ser: 0.79 mg/dL (ref 0.61–1.24)
GFR calc Af Amer: >60 mL/min (ref >60)
Glucose, Bld: 157 mg/dL — ABNORMAL HIGH (ref 70–99)
Potassium: 4 mmol/L (ref 3.5–5.1)
Sodium: 137 mmol/L (ref 135–145)
TOTAL PROTEIN: 7.7 g/dL (ref 6.5–8.1)

## 2018-07-03 LAB — TROPONIN I: Troponin I: <0.03 ng/mL (ref <0.03)

## 2018-07-03 MED ORDER — PANTOPRAZOLE SODIUM 40 MG PO TBEC
40.0000 mg | DELAYED_RELEASE_TABLET | Freq: Once | ORAL | Status: AC
  Administered 2018-07-03: 40 mg via ORAL
  Filled 2018-07-03: qty 1

## 2018-07-03 MED ORDER — GI COCKTAIL ~~LOC~~
30.0000 mL | Freq: Once | ORAL | Status: AC
  Administered 2018-07-03: 30 mL via ORAL
  Filled 2018-07-03: qty 30

## 2018-07-03 MED ORDER — PANTOPRAZOLE SODIUM 40 MG PO TBEC: 40.0000 mg | DELAYED_RELEASE_TABLET | Freq: Every day | ORAL | 0 refills | Status: DC

## 2018-07-03 NOTE — ED Provider Notes (Signed)
[facility] Emergency Department Provider Note    First MD Initiated Contact with Patient 07/03/18 [phone]     (approximate)  I have reviewed the triage vital signs and the nursing notes.   HISTORY  Chief Complaint Chest Pain    HPI Steven Miranda is a 72 y.o. male with below list of chronic medical conditions including hypertension and hyperlipidemia as well as diabetes presents to the emergency department with epigastric/chest tightness which began at 9 PM last night.  Patient denies any nausea vomiting or diarrhea.  Patient denies any dyspnea.  Patient states symptoms consistent with previous episodes of indigestion.   Past Medical History:  Diagnosis Date  . Broken wrist   . Diabetes mellitus without complication ([place])   . Dysuria   . Hyperlipidemia   . Hypertension     Patient Active Problem List   Diagnosis Date Noted  . Carpal tunnel syndrome 02/12/2018  . Shoulder stiffness 11/25/2017  . Shoulder joint pain 06/06/2017  . Acid indigestion 02/02/2015  . Essential (primary) hypertension 02/02/2015  . Injury of tendon of rotator cuff 02/02/2015  . Diabetes mellitus, type 2 ([place]) 02/02/2015  . HLD (hyperlipidemia) 05/19/2007    Past Surgical History:  Procedure Laterality Date  . COLONOSCOPY  2005  . COLONOSCOPY WITH PROPOFOL N/A 10/16/2016   Procedure: COLONOSCOPY WITH PROPOFOL;  Surgeon: Robert Bellow, MD;  Location: [facility] ENDOSCOPY;  Service: Endoscopy;  Laterality: N/A;  . STRABISMUS SURGERY Right     Prior to Admission medications   Medication Sig Start Date End Date Taking? Authorizing Provider  ACCU-CHEK SMARTVIEW test strip CHECK  FASTING  BLOOD  SUGAR EVERY MORNING AS INSTRUCTED 10/29/16   Chrismon, Vickki Muff, PA  amLODipine (NORVASC) 5 MG tablet Take 1 tablet (5 mg total) at bedtime by mouth. 08/07/17   Chrismon, Vickki Muff, PA  aspirin 81 MG tablet Take 1 tablet by mouth daily.    [provider]  atorvastatin (LIPITOR)  20 MG tablet Take 1 tablet (20 mg total) daily by mouth. 08/07/17   Chrismon, Vickki Muff, PA  Blood Glucose Monitoring Suppl (ACCU-CHEK NANO SMARTVIEW) w/Device KIT Use glucometer (Accu-Chek Nano Glucometer) to test fasting glucose daily. 12/31/16   Chrismon, Vickki Muff, PA  dexlansoprazole (DEXILANT) 60 MG capsule Take 1 capsule (60 mg total) by mouth daily. 12/12/17   Chrismon, Vickki Muff, PA  empagliflozin (JARDIANCE) 10 MG TABS tablet Take 10 mg by mouth daily. 06/25/17   Chrismon, Vickki Muff, PA  Ferrous Sulfate (IRON SUPPLEMENT PO) Take 1 tablet by mouth daily.    [provider]  fluticasone (FLONASE) 50 MCG/ACT nasal spray Place 2 sprays into both nostrils daily. Patient taking differently: Place 2 sprays into both nostrils daily.  07/09/17   Trinna Post, PA-C  Lancets [facility] ULTRASOFT) lancets Use as instructed once a day for fasting glucose check. 03/30/15   Chrismon, Vickki Muff, PA  lidocaine (XYLOCAINE) 2 % solution Take 5 ml mixed with 5 ml of Mylanta 2-3 times a day as needed for acute acid indigestion. 12/12/17   Chrismon, Vickki Muff, PA  meloxicam (MOBIC) 15 MG tablet meloxicam 15 mg tablet  TAKE 1 TABLET BY MOUTH EVERY DAY    [provider]  metFORMIN (GLUCOPHAGE) 1000 MG tablet Take 1 tablet (1,000 mg total) 2 (two) times daily with a meal by mouth. 08/07/17   Chrismon, Vickki Muff, PA  Misc Natural Products (OSTEO BI-FLEX ADV DOUBLE ST) CAPS Take 1 capsule by mouth daily.  [provider]  Probiotic Product (PROBIOTIC FORMULA) CAPS Take by mouth daily.    [provider]    Allergies No known drug allergies  Family History  Problem Relation Age of Onset  . Cancer Mother   . Alcohol abuse Father   . Heart disease Maternal Uncle   . Rheumatic fever Maternal Uncle   . Diabetes Paternal Grandmother   . Emphysema Paternal Grandfather     Social History Social History   Tobacco Use  . Smoking status: Former Smoker    Types: Cigarettes  .  Smokeless tobacco: Never Used  . Tobacco comment: quit in 1978  Substance Use Topics  . Alcohol use: Yes    Alcohol/week: 4.0 standard drinks    Types: 4 Cans of beer per week  . Drug use: No    Review of Systems Constitutional: No fever/chills Eyes: No visual changes. ENT: No sore throat. Cardiovascular: Denies chest pain. Respiratory: Denies shortness of breath. Gastrointestinal: No abdominal pain.  No nausea, no vomiting.  No diarrhea.  No constipation. Genitourinary: Negative for dysuria. Musculoskeletal: Negative for neck pain.  Negative for back pain. Integumentary: Negative for rash. Neurological: Negative for headaches, focal weakness or numbness.   ____________________________________________   PHYSICAL EXAM:  VITAL SIGNS: ED Triage Vitals  Enc Vitals Group     BP 07/03/18 0057 (!) 158/81     Pulse Rate 07/03/18 0057 71     Resp 07/03/18 0057 20     Temp 07/03/18 0057 98 F (36.7 C)     Temp Source 07/03/18 0057 Oral     SpO2 07/03/18 0057 95 %     Weight 07/03/18 0054 113.4 kg (250 lb)     Height 07/03/18 0054 1.905 m (6' 3" )     Head Circumference --      Peak Flow --      Pain Score 07/03/18 0054 5     Pain Loc --      Pain Edu? --      Excl. in [place]? --     Constitutional: Alert and oriented. Well appearing and in no acute distress. Eyes: Conjunctivae are normal. Mouth/Throat: Mucous membranes are moist.  Oropharynx non-erythematous. Neck: No stridor.  Cardiovascular: Normal rate, regular rhythm. Good peripheral circulation. Grossly normal heart sounds. Respiratory: Normal respiratory effort.  No retractions. Lungs CTAB. Gastrointestinal: Soft and nontender. No distention.   Musculoskeletal: No lower extremity tenderness nor edema. No gross deformities of extremities. Neurologic:  Normal speech and language. No gross focal neurologic deficits are appreciated.  Skin:  Skin is warm, dry and intact. No rash noted. Psychiatric: Mood and affect are  normal. Speech and behavior are normal.  ____________________________________________   LABS (all labs ordered are listed, but only abnormal results are displayed)  Labs Reviewed  CBC WITH DIFFERENTIAL/PLATELET - Abnormal; Notable for the following components:      Result Value   MCHC 36.4 (*)    All other components within normal limits  COMPREHENSIVE METABOLIC PANEL - Abnormal; Notable for the following components:   Glucose, Bld 157 (*)    All other components within normal limits  TROPONIN I  TROPONIN I   ____________________________________________  EKG  ED ECG REPORT I, Thornton N Kloee Ballew, the attending physician, personally viewed and interpreted this ECG.   Date: 07/03/2018  EKG Time: 12:54 AM  Rate: 71  Rhythm: Normal sinus rhythm  Axis: Normal  Intervals: Normal  ST&T Change: None  ____________________________________________  RADIOLOGY I, [place] Ernst Bowler, personally  viewed and evaluated these images (plain radiographs) as part of my medical decision making, as well as reviewing the written report by the radiologist.  ED MD interpretation: Normal chest x-ray per radiologist  Official radiology report(s): Dg Chest 2 View  Result Date: 07/03/2018 CLINICAL DATA:  Chest tightness EXAM: CHEST - 2 VIEW COMPARISON:  10/26/2017 FINDINGS: Lungs are clear.  No pleural effusion or pneumothorax. The heart is normal in size. Visualized osseous structures are within normal limits. IMPRESSION: Normal chest radiographs. Electronically Signed   By: Julian Hy M.D.   On: 07/03/2018 01:37      Procedures   ____________________________________________   INITIAL IMPRESSION / ASSESSMENT AND PLAN / ED COURSE  As part of my medical decision making, I reviewed the following data within the electronic MEDICAL RECORD NUMBER   72 year old male presenting with above-stated history and physical exam secondary to epigastric/chest discomfort.  Consider possible ACS and as such  EKG was performed revealed no evidence of ischemia or infarction.  Laboratory data including troponin x2-.  Review of the patient's CAT scan from 2017 revealed no evidence of aortic aneurysm or any other epigastric pathology.  Patient given a GI cocktail and Protonix with pain improvement.  Will prescribe Protonix for home  ____________________________________________  FINAL CLINICAL IMPRESSION(S) / ED DIAGNOSES  Final diagnoses:  Gastroesophageal reflux disease, esophagitis presence not specified  Chest pain, unspecified type     MEDICATIONS GIVEN DURING THIS VISIT:  Medications  gi cocktail (Maalox,Lidocaine,Donnatal) (30 mLs Oral Given 07/03/18 0436)  pantoprazole (PROTONIX) EC tablet 40 mg (40 mg Oral Given 07/03/18 0514)     ED Discharge Orders    None       Note:  This document was prepared using Dragon voice recognition software and may include unintentional dictation errors.    Gregor Hams, MD 07/03/18 [phone]

## 2018-07-03 NOTE — ED Notes (Signed)
ED Provider at bedside. 

## 2018-07-03 NOTE — ED Triage Notes (Signed)
Patient to ER for chest tightness that began at approx 2100. Denies any nausea, diaphoresis, or radiating of pain. Patient reports history of "indigestion", which he states previous episodes of the same feel similar to this episode.

## 2018-07-06 NOTE — Telephone Encounter (Signed)
Opened error. KW

## 2018-07-07 ENCOUNTER — Other Ambulatory Visit: Payer: Self-pay | Admitting: Family Medicine

## 2018-07-07 ENCOUNTER — Encounter: Payer: Self-pay | Admitting: Family Medicine

## 2018-07-07 DIAGNOSIS — R1013 Epigastric pain: Secondary | ICD-10-CM

## 2018-07-07 MED ORDER — PANTOPRAZOLE SODIUM 40 MG PO TBEC: 40.0000 mg | DELAYED_RELEASE_TABLET | Freq: Every day | ORAL | 0 refills | Status: DC

## 2018-07-21 ENCOUNTER — Encounter: Payer: Self-pay | Admitting: Family Medicine

## 2018-07-21 ENCOUNTER — Other Ambulatory Visit: Payer: Self-pay | Admitting: Family Medicine

## 2018-07-21 DIAGNOSIS — G5601 Carpal tunnel syndrome, right upper limb: Secondary | ICD-10-CM | POA: Diagnosis not present

## 2018-10-07 ENCOUNTER — Encounter: Payer: Self-pay | Admitting: Family Medicine

## 2018-10-08 ENCOUNTER — Other Ambulatory Visit: Payer: Self-pay | Admitting: Family Medicine

## 2018-10-08 DIAGNOSIS — E0800 Diabetes mellitus due to underlying condition with hyperosmolarity without nonketotic hyperglycemic-hyperosmolar coma (NKHHC): Secondary | ICD-10-CM

## 2018-10-08 DIAGNOSIS — E782 Mixed hyperlipidemia: Secondary | ICD-10-CM

## 2018-10-08 DIAGNOSIS — I10 Essential (primary) hypertension: Secondary | ICD-10-CM

## 2018-10-08 DIAGNOSIS — R1013 Epigastric pain: Secondary | ICD-10-CM

## 2018-10-08 DIAGNOSIS — E119 Type 2 diabetes mellitus without complications: Secondary | ICD-10-CM

## 2018-10-08 MED ORDER — PANTOPRAZOLE SODIUM 40 MG PO TBEC: 40.0000 mg | DELAYED_RELEASE_TABLET | Freq: Every day | ORAL | 0 refills | Status: DC

## 2018-10-08 MED ORDER — AMLODIPINE BESYLATE 5 MG PO TABS: 5.0000 mg | ORAL_TABLET | Freq: Every day | ORAL | 3 refills | Status: DC

## 2018-10-08 MED ORDER — METFORMIN HCL 1000 MG PO TABS: 1000.0000 mg | ORAL_TABLET | Freq: Two times a day (BID) | ORAL | 3 refills | Status: DC

## 2018-10-08 MED ORDER — EMPAGLIFLOZIN 10 MG PO TABS: 10.0000 mg | ORAL_TABLET | Freq: Every day | ORAL | 3 refills | Status: DC

## 2018-10-08 MED ORDER — ATORVASTATIN CALCIUM 20 MG PO TABS: 20.0000 mg | ORAL_TABLET | Freq: Every day | ORAL | 3 refills | Status: DC

## 2018-10-25 ENCOUNTER — Other Ambulatory Visit: Payer: Self-pay | Admitting: Family Medicine

## 2018-10-25 DIAGNOSIS — R1013 Epigastric pain: Secondary | ICD-10-CM

## 2018-12-11 DIAGNOSIS — Z0100 Encounter for examination of eyes and vision without abnormal findings: Secondary | ICD-10-CM | POA: Diagnosis not present

## 2018-12-21 ENCOUNTER — Encounter: Payer: Self-pay | Admitting: Family Medicine

## 2018-12-21 NOTE — Telephone Encounter (Signed)
Could offer this patient a Webex or appointment evaluation of his sore throat to see if antibiotic needed. Also, due for a 6 month follow up of Hgb A1C which could be done when he comes for his samples

## 2018-12-22 ENCOUNTER — Encounter: Payer: Self-pay | Admitting: Family Medicine

## 2018-12-22 ENCOUNTER — Other Ambulatory Visit: Payer: Self-pay

## 2018-12-22 ENCOUNTER — Ambulatory Visit (INDEPENDENT_AMBULATORY_CARE_PROVIDER_SITE_OTHER): Payer: Medicare HMO | Admitting: Family Medicine

## 2018-12-22 VITALS — BP 120/70 | HR 64 | Temp 97.9°F | Resp 16 | Wt 253.0 lb

## 2018-12-22 DIAGNOSIS — E119 Type 2 diabetes mellitus without complications: Secondary | ICD-10-CM | POA: Diagnosis not present

## 2018-12-22 DIAGNOSIS — J029 Acute pharyngitis, unspecified: Secondary | ICD-10-CM | POA: Diagnosis not present

## 2018-12-22 LAB — POCT GLYCOSYLATED HEMOGLOBIN (HGB A1C)
EOS (ABSOLUTE): 169
HEMOGLOBIN A1C: 7.5 % — AB (ref 4.0–5.6)

## 2018-12-22 LAB — POCT RAPID STREP A (OFFICE): Rapid Strep A Screen: NEGATIVE

## 2018-12-22 NOTE — Progress Notes (Signed)
Patient: Steven Miranda Male    DOB: 10-31-1945   73 y.o.   MRN: [id number] Visit Date: 12/22/2018  Today's Provider: Vernie Murders, PA   Chief Complaint  Patient presents with   Follow-up   Diabetes   Sore Throat   Subjective:     Diabetes Mellitus Type II, Follow-up:   Lab Results  Component Value Date   HGBA1C 7.5 (H) 06/17/2018   HGBA1C 7.3 (H) 03/10/2018   HGBA1C 8.7 03/29/2017   Last seen for diabetes 6 months ago.  Management since then includes; labs checked, no changes. He reports good compliance with treatment. He is not having side effects. none Current symptoms include none and have been unchanged. Home blood sugar records: fasting range: 130  Episodes of hypoglycemia? no   Current Insulin Regimen: n/a Most Recent Eye Exam: due Weight trend: stable Prior visit with dietician: no Current diet: well balanced Current exercise: running/ jogging  -------------------------------------------------------------------   Sore Throat   This is a new problem. The current episode started in the past 7 days. There has been no fever. Associated symptoms include coughing, ear discharge, headaches, a plugged ear sensation and swollen glands. Pertinent negatives include no abdominal pain, congestion, diarrhea, drooling, ear pain, hoarse voice, neck pain, shortness of breath, stridor, trouble swallowing or vomiting. Treatments tried: allergy medication. The treatment provided no relief.   Patient has had sore throat for over 1 week. Patient states he had sone nasal drainage and congestion at first that seems to have cleared up now. Patient also has symptoms of congested ears. Patient has been taking allergy medication for symptoms with no relief.   Past Medical History:  Diagnosis Date   Broken wrist    Diabetes mellitus without complication ([place])    Dysuria    Hyperlipidemia    Hypertension    Past Surgical History:  Procedure Laterality Date    COLONOSCOPY  2005   COLONOSCOPY WITH PROPOFOL N/A 10/16/2016   Procedure: COLONOSCOPY WITH PROPOFOL;  Surgeon: Robert Bellow, MD;  Location: [facility] ENDOSCOPY;  Service: Endoscopy;  Laterality: N/A;   STRABISMUS SURGERY Right    Family History  Problem Relation Age of Onset   Cancer Mother    Alcohol abuse Father    Heart disease Maternal Uncle    Rheumatic fever Maternal Uncle    Diabetes Paternal Grandmother    Emphysema Paternal Grandfather    No Known Allergies  Current Outpatient Medications:    ACCU-CHEK SMARTVIEW test strip, CHECK  FASTING  BLOOD  SUGAR EVERY MORNING AS INSTRUCTED, Disp: 100 each, Rfl: 4   amLODipine (NORVASC) 5 MG tablet, Take 1 tablet (5 mg total) by mouth at bedtime., Disp: 90 tablet, Rfl: 3   aspirin 81 MG tablet, Take 1 tablet by mouth daily., Disp: , Rfl:    atorvastatin (LIPITOR) 20 MG tablet, Take 1 tablet (20 mg total) by mouth daily., Disp: 90 tablet, Rfl: 3   Blood Glucose Monitoring Suppl (ACCU-CHEK NANO SMARTVIEW) w/Device KIT, Use glucometer (Accu-Chek Nano Glucometer) to test fasting glucose daily., Disp: 1 kit, Rfl: 0   empagliflozin (JARDIANCE) 10 MG TABS tablet, Take 10 mg by mouth daily., Disp: 90 tablet, Rfl: 3   Ferrous Sulfate (IRON SUPPLEMENT PO), Take 1 tablet by mouth daily., Disp: , Rfl:    fluticasone (FLONASE) 50 MCG/ACT nasal spray, Place 2 sprays into both nostrils daily. (Patient taking differently: Place 2 sprays into both nostrils daily. ), Disp: 16 g, Rfl: 6  Lancets (ONETOUCH ULTRASOFT) lancets, Use as instructed once a day for fasting glucose check., Disp: 100 each, Rfl: 3   lidocaine (XYLOCAINE) 2 % solution, Take 5 ml mixed with 5 ml of Mylanta 2-3 times a day as needed for acute acid indigestion., Disp: 100 mL, Rfl: 0   meloxicam (MOBIC) 15 MG tablet, meloxicam 15 mg tablet  TAKE 1 TABLET BY MOUTH EVERY DAY, Disp: , Rfl:    metFORMIN (GLUCOPHAGE) 1000 MG tablet, Take 1 tablet (1,000 mg total) by mouth 2  (two) times daily with a meal., Disp: 180 tablet, Rfl: 3   Misc Natural Products (OSTEO BI-FLEX ADV DOUBLE ST) CAPS, Take 1 capsule by mouth daily., Disp: , Rfl:    pantoprazole (PROTONIX) 40 MG tablet, TAKE 1 TABLET(40 MG) BY MOUTH DAILY, Disp: 90 tablet, Rfl: 2   Probiotic Product (PROBIOTIC FORMULA) CAPS, Take by mouth daily., Disp: , Rfl:   Review of Systems  HENT: Positive for ear discharge and sore throat. Negative for congestion, drooling, ear pain, hoarse voice and trouble swallowing.   Respiratory: Positive for cough. Negative for shortness of breath and stridor.   Gastrointestinal: Negative for abdominal pain, diarrhea and vomiting.  Musculoskeletal: Negative for neck pain.  Neurological: Positive for headaches.   Social History   Tobacco Use   Smoking status: Former Smoker    Types: Cigarettes   Smokeless tobacco: Never Used   Tobacco comment: quit in 1978  Substance Use Topics   Alcohol use: Yes    Alcohol/week: 4.0 standard drinks    Types: 4 Cans of beer per week     Objective:   BP 120/70 (BP Location: Right Arm, Patient Position: Sitting, Cuff Size: Large)    Pulse 64    Temp 97.9 F (36.6 C) (Oral)    Resp 16    Wt 253 lb (114.8 kg)    SpO2 98%    BMI 31.62 kg/m  Vitals:   12/22/18 1014  BP: 120/70  Pulse: 64  Resp: 16  Temp: 97.9 F (36.6 C)  TempSrc: Oral  SpO2: 98%  Weight: 253 lb (114.8 kg)   Physical Exam Constitutional:      General: He is not in acute distress.    Appearance: He is well-developed.  HENT:     Head: Normocephalic and atraumatic.     Right Ear: Hearing and tympanic membrane normal.     Left Ear: Hearing and tympanic membrane normal.     Nose: Nose normal. No congestion or rhinorrhea.     Mouth/Throat:     Pharynx: Posterior oropharyngeal erythema present. No oropharyngeal exudate or uvula swelling.     Tonsils: No tonsillar exudate or tonsillar abscesses.  Eyes:     General: Lids are normal. No scleral icterus.        Right eye: No discharge.        Left eye: No discharge.     Conjunctiva/sclera: Conjunctivae normal.  Cardiovascular:     Rate and Rhythm: Normal rate and regular rhythm.     Heart sounds: Normal heart sounds.  Pulmonary:     Effort: Pulmonary effort is normal. No respiratory distress.  Abdominal:     General: Bowel sounds are normal.     Palpations: Abdomen is soft.  Musculoskeletal: Normal range of motion.  Skin:    Findings: No lesion or rash.  Neurological:     Mental Status: He is alert and oriented to person, place, and time.  Psychiatric:  Speech: Speech normal.        Behavior: Behavior normal.        Thought Content: Thought content normal.       Assessment & Plan    1. Type 2 diabetes mellitus without complication, without long-term current use of insulin (HCC) FBS average 120-130 in the past 60 days. No hypoglycemic episodes, polyuria, polydipsia or vision changes. Continues to tolerate the Metformin 1000 mg BID and Jardiance 10 mg qd (given samples to help navigate the "doughnut hole"). Hgb A1C 7.5% today. Continue to check FBS daily and follow low fat diabetic diet. Encouraged to continue regular exercise and schedule ophthalmology exam this year. Recheck in 6 months. - POCT glycosylated hemoglobin (Hb A1C)  2. Sore throat Developed a sore throat over the past week without fever. Slight cough and PND, also. Strep test is negative. May use antihistamine, Mucinex-DM prn cough, saltwater gargles or throat lozenges for discomfort. Recheck prn. - POCT rapid strep A   I,April Miller,acting as a scribe for [employer].,have documented all relevant documentation on the behalf of [employer],as directed by  [employer] while in the presence of [employer], [place].     Vernie Murders, PA  [facility]

## 2019-01-29 ENCOUNTER — Other Ambulatory Visit: Payer: Self-pay | Admitting: Family Medicine

## 2019-01-29 ENCOUNTER — Encounter: Payer: Self-pay | Admitting: Family Medicine

## 2019-01-29 ENCOUNTER — Other Ambulatory Visit: Payer: Self-pay

## 2019-01-29 ENCOUNTER — Ambulatory Visit (INDEPENDENT_AMBULATORY_CARE_PROVIDER_SITE_OTHER): Payer: Medicare HMO | Admitting: Family Medicine

## 2019-01-29 VITALS — BP 140/72 | HR 66 | Temp 98.6°F

## 2019-01-29 DIAGNOSIS — R0789 Other chest pain: Secondary | ICD-10-CM | POA: Diagnosis not present

## 2019-01-29 DIAGNOSIS — I10 Essential (primary) hypertension: Secondary | ICD-10-CM | POA: Diagnosis not present

## 2019-01-29 DIAGNOSIS — K30 Functional dyspepsia: Secondary | ICD-10-CM

## 2019-01-29 MED ORDER — SUCRALFATE 1 G PO TABS: 1.0000 g | ORAL_TABLET | Freq: Three times a day (TID) | ORAL | 1 refills | Status: DC

## 2019-01-29 MED ORDER — HYOSCYAMINE SULFATE 0.125 MG SL SUBL: 0.1250 mg | SUBLINGUAL_TABLET | Freq: Four times a day (QID) | SUBLINGUAL | 1 refills | Status: DC | PRN

## 2019-01-29 NOTE — Progress Notes (Signed)
Patient: Steven Miranda Male    DOB: 11-10-1945   73 y.o.   MRN: [id number] Visit Date: 01/29/2019  Today's Provider: Vernie Murders, PA   No chief complaint on file.  Subjective:     HPI Patient comes in today c/o heart flutters. He has had this before in the past, but reports that it was diagnosed as GERD. Patient reports that he has taken his anitacid meds consistently, but still no relief. This chest/epigastric discomfort is similar to past ER visit on 07-03-18. Was concerned about heart but no dyspnea, sweats or pain with exertion.  Past Medical History:  Diagnosis Date  . Broken wrist   . Diabetes mellitus without complication ([place])   . Dysuria   . Hyperlipidemia   . Hypertension    Past Surgical History:  Procedure Laterality Date  . COLONOSCOPY  2005  . COLONOSCOPY WITH PROPOFOL N/A 10/16/2016   Procedure: COLONOSCOPY WITH PROPOFOL;  Surgeon: Robert Bellow, MD;  Location: [facility] ENDOSCOPY;  Service: Endoscopy;  Laterality: N/A;  . STRABISMUS SURGERY Right    Family History  Problem Relation Age of Onset  . Cancer Mother   . Alcohol abuse Father   . Heart disease Maternal Uncle   . Rheumatic fever Maternal Uncle   . Diabetes Paternal Grandmother   . Emphysema Paternal Grandfather    No Known Allergies  Current Outpatient Medications:  .  ACCU-CHEK SMARTVIEW test strip, CHECK  FASTING  BLOOD  SUGAR EVERY MORNING AS INSTRUCTED, Disp: 100 each, Rfl: 4 .  amLODipine (NORVASC) 5 MG tablet, Take 1 tablet (5 mg total) by mouth at bedtime., Disp: 90 tablet, Rfl: 3 .  aspirin 81 MG tablet, Take 1 tablet by mouth daily., Disp: , Rfl:  .  atorvastatin (LIPITOR) 20 MG tablet, Take 1 tablet (20 mg total) by mouth daily., Disp: 90 tablet, Rfl: 3 .  Blood Glucose Monitoring Suppl (ACCU-CHEK NANO SMARTVIEW) w/Device KIT, Use glucometer (Accu-Chek Nano Glucometer) to test fasting glucose daily., Disp: 1 kit, Rfl: 0 .  empagliflozin (JARDIANCE) 10 MG TABS tablet, Take 10 mg  by mouth daily., Disp: 90 tablet, Rfl: 3 .  Ferrous Sulfate (IRON SUPPLEMENT PO), Take 1 tablet by mouth daily., Disp: , Rfl:  .  fluticasone (FLONASE) 50 MCG/ACT nasal spray, Place 2 sprays into both nostrils daily. (Patient taking differently: Place 2 sprays into both nostrils daily. ), Disp: 16 g, Rfl: 6 .  Lancets (ONETOUCH ULTRASOFT) lancets, Use as instructed once a day for fasting glucose check., Disp: 100 each, Rfl: 3 .  lidocaine (XYLOCAINE) 2 % solution, Take 5 ml mixed with 5 ml of Mylanta 2-3 times a day as needed for acute acid indigestion., Disp: 100 mL, Rfl: 0 .  meloxicam (MOBIC) 15 MG tablet, meloxicam 15 mg tablet  TAKE 1 TABLET BY MOUTH EVERY DAY, Disp: , Rfl:  .  metFORMIN (GLUCOPHAGE) 1000 MG tablet, Take 1 tablet (1,000 mg total) by mouth 2 (two) times daily with a meal., Disp: 180 tablet, Rfl: 3 .  Misc Natural Products (OSTEO BI-FLEX ADV DOUBLE ST) CAPS, Take 1 capsule by mouth daily., Disp: , Rfl:  .  pantoprazole (PROTONIX) 40 MG tablet, TAKE 1 TABLET(40 MG) BY MOUTH DAILY, Disp: 90 tablet, Rfl: 2 .  Probiotic Product (PROBIOTIC FORMULA) CAPS, Take by mouth daily., Disp: , Rfl:   Review of Systems  Constitutional: Negative.   HENT: Negative.   Respiratory: Negative.   Cardiovascular: Positive for chest pain.  Epigastric and lower sternal discomfort with acid indigestion.  Gastrointestinal: Negative for abdominal pain, blood in stool, diarrhea, nausea and vomiting.   Social History   Tobacco Use  . Smoking status: Former Smoker    Types: Cigarettes  . Smokeless tobacco: Never Used  . Tobacco comment: quit in 1978  Substance Use Topics  . Alcohol use: Yes    Alcohol/week: 4.0 standard drinks    Types: 4 Cans of beer per week      Objective:   BP 140/72 (BP Location: Left Arm, Patient Position: Sitting, Cuff Size: Normal)   Pulse 66   Temp 98.6 F (37 C)   SpO2 96%  Vitals:   01/29/19 1559  BP: 140/72  Pulse: 66  Temp: 98.6 F (37 C)  SpO2: 96%    Physical Exam Constitutional:      General: He is not in acute distress.    Appearance: Normal appearance. He is well-developed.  HENT:     Head: Normocephalic and atraumatic.     Right Ear: Hearing normal.     Left Ear: Hearing normal.     Nose: Nose normal.  Eyes:     General: Lids are normal. No scleral icterus.       Right eye: No discharge.        Left eye: No discharge.     Conjunctiva/sclera: Conjunctivae normal.  Neck:     Musculoskeletal: Neck supple.  Cardiovascular:     Rate and Rhythm: Normal rate and regular rhythm.     Heart sounds: Normal heart sounds.  Pulmonary:     Effort: Pulmonary effort is normal. No respiratory distress.     Breath sounds: Normal breath sounds. No wheezing.     Comments: Minimal epigastric discomfort today - none at the present. Abdominal:     General: Bowel sounds are normal.     Palpations: There is no mass.  Musculoskeletal: Normal range of motion.  Lymphadenopathy:     Cervical: No cervical adenopathy.  Skin:    Findings: No lesion or rash.  Neurological:     Mental Status: He is alert and oriented to person, place, and time.  Psychiatric:        Speech: Speech normal.        Behavior: Behavior normal.        Thought Content: Thought content normal.       Assessment & Plan    1. Chest discomfort Onset the past 24 hours without dyspnea or diaphoresis. No pain or pressure into neck, jaw or arms. EKG did not show any acute changes. Had similar episode 07-03-18 with no cardiovascular problems identified. Was treated with GI cocktail and Protonix with nearly immediate relief. Tried Protonix BID today with liquid antacid and Lidocaine 2% solution without relief. Will switch to Carafate and Levsin-SL with meals and bedtime over the weekend. If no better, may need referral to gastroenterologist for EGD. - EKG 12-Lead - sucralfate (CARAFATE) 1 g tablet; Take 1 tablet (1 g total) by mouth 4 (four) times daily -  with meals and at  bedtime.  Dispense: 40 tablet; Refill: 1 - hyoscyamine (LEVSIN SL) 0.125 MG SL tablet; Place 1 tablet (0.125 mg total) under the tongue every 6 (six) hours as needed.  Dispense: 30 tablet; Refill: 1  2. Acid indigestion Frequently has recurrences since 2016. Presently on Protonix with an acute flare after using Ibuprofen more this week for left shoulder discomfort due to arthritis. Recommend he switch to moist heat applications  and use Tylenol prn. If no better soon, may need to recheck with orthopedist for cortisone injection. - sucralfate (CARAFATE) 1 g tablet; Take 1 tablet (1 g total) by mouth 4 (four) times daily -  with meals and at bedtime.  Dispense: 40 tablet; Refill: 1 - hyoscyamine (LEVSIN SL) 0.125 MG SL tablet; Place 1 tablet (0.125 mg total) under the tongue every 6 (six) hours as needed.  Dispense: 30 tablet; Refill: 1  3. Essential (primary) hypertension Well controlled with use of Norvasc 5 mg qd and no peripheral edema. Restrict caffeine and salt intake.      Vernie Murders, PA  [facility]

## 2019-02-02 DIAGNOSIS — D692 Other nonthrombocytopenic purpura: Secondary | ICD-10-CM | POA: Diagnosis not present

## 2019-02-02 DIAGNOSIS — L578 Other skin changes due to chronic exposure to nonionizing radiation: Secondary | ICD-10-CM | POA: Diagnosis not present

## 2019-02-02 DIAGNOSIS — D225 Melanocytic nevi of trunk: Secondary | ICD-10-CM | POA: Diagnosis not present

## 2019-02-02 DIAGNOSIS — L821 Other seborrheic keratosis: Secondary | ICD-10-CM | POA: Diagnosis not present

## 2019-02-02 DIAGNOSIS — L820 Inflamed seborrheic keratosis: Secondary | ICD-10-CM | POA: Diagnosis not present

## 2019-02-02 DIAGNOSIS — Q825 Congenital non-neoplastic nevus: Secondary | ICD-10-CM | POA: Diagnosis not present

## 2019-02-02 DIAGNOSIS — Z1283 Encounter for screening for malignant neoplasm of skin: Secondary | ICD-10-CM | POA: Diagnosis not present

## 2019-02-02 DIAGNOSIS — D1800 Hemangioma unspecified site: Secondary | ICD-10-CM | POA: Diagnosis not present

## 2019-02-07 ENCOUNTER — Emergency Department: Payer: Medicare HMO

## 2019-02-07 ENCOUNTER — Encounter: Payer: Self-pay | Admitting: Emergency Medicine

## 2019-02-07 ENCOUNTER — Other Ambulatory Visit: Payer: Self-pay

## 2019-02-07 ENCOUNTER — Emergency Department
Admission: EM | Admit: 2019-02-07 | Discharge: 2019-02-07 | Disposition: A | Discharge status: Home / Self Care | Payer: Medicare HMO | Attending: Emergency Medicine | Admitting: Emergency Medicine

## 2019-02-07 DIAGNOSIS — Z87891 Personal history of nicotine dependence: Secondary | ICD-10-CM | POA: Diagnosis not present

## 2019-02-07 DIAGNOSIS — Z79899 Other long term (current) drug therapy: Secondary | ICD-10-CM | POA: Diagnosis not present

## 2019-02-07 DIAGNOSIS — Y9301 Activity, walking, marching and hiking: Secondary | ICD-10-CM | POA: Insufficient documentation

## 2019-02-07 DIAGNOSIS — E119 Type 2 diabetes mellitus without complications: Secondary | ICD-10-CM | POA: Diagnosis not present

## 2019-02-07 DIAGNOSIS — R51 Headache: Secondary | ICD-10-CM | POA: Diagnosis not present

## 2019-02-07 DIAGNOSIS — S0990XA Unspecified injury of head, initial encounter: Secondary | ICD-10-CM | POA: Insufficient documentation

## 2019-02-07 DIAGNOSIS — I10 Essential (primary) hypertension: Secondary | ICD-10-CM | POA: Diagnosis not present

## 2019-02-07 DIAGNOSIS — W01198A Fall on same level from slipping, tripping and stumbling with subsequent striking against other object, initial encounter: Secondary | ICD-10-CM | POA: Insufficient documentation

## 2019-02-07 DIAGNOSIS — Y92830 Public park as the place of occurrence of the external cause: Secondary | ICD-10-CM | POA: Diagnosis not present

## 2019-02-07 DIAGNOSIS — Y998 Other external cause status: Secondary | ICD-10-CM | POA: Insufficient documentation

## 2019-02-07 DIAGNOSIS — E785 Hyperlipidemia, unspecified: Secondary | ICD-10-CM | POA: Diagnosis not present

## 2019-02-07 MED ORDER — ACETAMINOPHEN 325 MG PO TABS
650.0000 mg | ORAL_TABLET | Freq: Once | ORAL | Status: AC
  Administered 2019-02-07: 650 mg via ORAL
  Filled 2019-02-07: qty 2

## 2019-02-07 NOTE — ED Provider Notes (Signed)
[facility] Emergency Department Provider Note  ____________________________________________  Time seen: Approximately 6:29 PM  I have reviewed the triage vital signs and the nursing notes.   HISTORY  Chief Complaint Fall    HPI Steven Miranda is a 73 y.o. male presents to the emergency department after a [place] that [place] three hours ago today.  Patient reports that he tripped and lost his balance and hit his head against a rock.  He did not lose consciousness but he has had progressively worsening headache.  No neck pain, back pain, chest pain, chest tightness or abdominal pain.  No blurry vision or vertigo.  Patient denies subjective weakness or numbness or tingling in the upper or lower extremities.  No bowel or bladder incontinence.  He is not currently taking any blood thinners.  Patient worked as a Audiological scientist for 39 years and reports that he was concerned due to headache.  No other alleviating measures have been attempted.        Past Medical History:  Diagnosis Date  . Broken wrist   . Diabetes mellitus without complication ([place])   . Dysuria   . Hyperlipidemia   . Hypertension     Patient Active Problem List   Diagnosis Date Noted  . Carpal tunnel syndrome 02/12/2018  . Shoulder stiffness 11/25/2017  . Shoulder joint pain 06/06/2017  . Acid indigestion 02/02/2015  . Essential (primary) hypertension 02/02/2015  . Injury of tendon of rotator cuff 02/02/2015  . Diabetes mellitus, type 2 ([place]) 02/02/2015  . HLD (hyperlipidemia) 05/19/2007    Past Surgical History:  Procedure Laterality Date  . COLONOSCOPY  2005  . COLONOSCOPY WITH PROPOFOL N/A 10/16/2016   Procedure: COLONOSCOPY WITH PROPOFOL;  Surgeon: Robert Bellow, MD;  Location: [facility] ENDOSCOPY;  Service: Endoscopy;  Laterality: N/A;  . STRABISMUS SURGERY Right     Prior to Admission medications   Medication Sig Start Date End Date Taking? Authorizing Provider   ACCU-CHEK SMARTVIEW test strip CHECK  FASTING  BLOOD  SUGAR EVERY MORNING AS INSTRUCTED 10/29/16   Chrismon, Vickki Muff, PA  amLODipine (NORVASC) 5 MG tablet Take 1 tablet (5 mg total) by mouth at bedtime. 10/08/18   Chrismon, Vickki Muff, PA  aspirin 81 MG tablet Take 1 tablet by mouth daily.    [provider]  atorvastatin (LIPITOR) 20 MG tablet Take 1 tablet (20 mg total) by mouth daily. 10/08/18   Chrismon, Vickki Muff, PA  Blood Glucose Monitoring Suppl (ACCU-CHEK NANO SMARTVIEW) w/Device KIT Use glucometer (Accu-Chek Nano Glucometer) to test fasting glucose daily. 12/31/16   Chrismon, Vickki Muff, PA  empagliflozin (JARDIANCE) 10 MG TABS tablet Take 10 mg by mouth daily. 10/08/18   Chrismon, Vickki Muff, PA  Ferrous Sulfate (IRON SUPPLEMENT PO) Take 1 tablet by mouth daily.    [provider]  fluticasone (FLONASE) 50 MCG/ACT nasal spray Place 2 sprays into both nostrils daily. Patient taking differently: Place 2 sprays into both nostrils daily.  07/09/17   Trinna Post, PA-C  hyoscyamine (LEVSIN SL) 0.125 MG SL tablet Place 1 tablet (0.125 mg total) under the tongue every 6 (six) hours as needed. 01/29/19   Chrismon, Vickki Muff, PA  Lancets (ONETOUCH ULTRASOFT) lancets Use as instructed once a day for fasting glucose check. 03/30/15   Chrismon, Vickki Muff, PA  lidocaine (XYLOCAINE) 2 % solution Take 5 ml mixed with 5 ml of Mylanta 2-3 times a day as needed for acute acid indigestion. 12/12/17  Chrismon, Vickki Muff, PA  metFORMIN (GLUCOPHAGE) 1000 MG tablet Take 1 tablet (1,000 mg total) by mouth 2 (two) times daily with a meal. 10/08/18   Chrismon, Vickki Muff, PA  Misc Natural Products (OSTEO BI-FLEX ADV DOUBLE ST) CAPS Take 1 capsule by mouth daily.    [provider]  pantoprazole (PROTONIX) 40 MG tablet TAKE 1 TABLET(40 MG) BY MOUTH DAILY 10/26/18   Chrismon, Vickki Muff, PA  Probiotic Product (PROBIOTIC FORMULA) CAPS Take by mouth daily.    [provider]  sucralfate (CARAFATE) 1 g  tablet Take 1 tablet (1 g total) by mouth 4 (four) times daily -  with meals and at bedtime. 01/29/19   Chrismon, Vickki Muff, PA    Allergies Patient has no known allergies.  Family History  Problem Relation Age of Onset  . Cancer Mother   . Alcohol abuse Father   . Heart disease Maternal Uncle   . Rheumatic fever Maternal Uncle   . Diabetes Paternal Grandmother   . Emphysema Paternal Grandfather     Social History Social History   Tobacco Use  . Smoking status: Former Smoker    Types: Cigarettes  . Smokeless tobacco: Never Used  . Tobacco comment: quit in 1978  Substance Use Topics  . Alcohol use: Yes    Alcohol/week: 4.0 standard drinks    Types: 4 Cans of beer per week  . Drug use: No     Review of Systems  Constitutional: No fever/chills Eyes: No visual changes. No discharge ENT: No upper respiratory complaints. Cardiovascular: no chest pain. Respiratory: no cough. No SOB. Gastrointestinal: No abdominal pain.  No nausea, no vomiting.  No diarrhea.  No constipation. Musculoskeletal: Negative for musculoskeletal pain. Skin: Negative for rash, abrasions, lacerations, ecchymosis. Neurological: Patient has headache, no focal weakness or numbness.   ____________________________________________   PHYSICAL EXAM:  VITAL SIGNS: ED Triage Vitals  Enc Vitals Group     BP 02/07/19 1732 (!) 149/86     Pulse Rate 02/07/19 1732 88     Resp 02/07/19 1732 20     Temp 02/07/19 1732 98.1 F (36.7 C)     Temp Source 02/07/19 1732 Oral     SpO2 02/07/19 1732 97 %     Weight 02/07/19 1728 250 lb (113.4 kg)     Height 02/07/19 1728 6' 2"  (1.88 m)     Head Circumference --      Peak Flow --      Pain Score 02/07/19 1728 3     Pain Loc --      Pain Edu? --      Excl. in [place]? --      Constitutional: Alert and oriented. Well appearing and in no acute distress. Eyes: Conjunctivae are normal. PERRL. EOMI. Head: Atraumatic. ENT:      Ears: TMs are pearly.      Nose: No  congestion/rhinnorhea.      Mouth/Throat: Mucous membranes are moist.  Neck: Full range of motion.  No midline C-spine tenderness. Cardiovascular: Normal rate, regular rhythm. Normal S1 and S2.  Good peripheral circulation. Respiratory: Normal respiratory effort without tachypnea or retractions. Lungs CTAB. Good air entry to the bases with no decreased or absent breath sounds. Gastrointestinal: Bowel sounds 4 quadrants. Soft and nontender to palpation. No guarding or rigidity. No palpable masses. No distention. No CVA tenderness. Musculoskeletal: Full range of motion to all extremities. No gross deformities appreciated. Neurologic:  Normal speech and language. No gross focal neurologic deficits are  appreciated.  Skin:  Skin is warm, dry and intact. No rash noted. Psychiatric: Mood and affect are normal. Speech and behavior are normal. Patient exhibits appropriate insight and judgement.   ____________________________________________   LABS (all labs ordered are listed, but only abnormal results are displayed)  Labs Reviewed - No data to display ____________________________________________  EKG   ____________________________________________  RADIOLOGY I personally viewed and evaluated these images as part of my medical decision making, as well as reviewing the written report by the radiologist.    Ct Head Wo Contrast  Result Date: 02/07/2019 CLINICAL DATA:  Recent fall while hiking with headaches, initial encounter EXAM: CT HEAD WITHOUT CONTRAST TECHNIQUE: Contiguous axial images were obtained from the base of the skull through the vertex without intravenous contrast. COMPARISON:  11/18/2009 FINDINGS: Brain: Mild atrophic changes are noted stable from the prior exam. No findings to suggest acute hemorrhage, acute infarction or space-occupying mass lesion are noted. Vascular: No hyperdense vessel or unexpected calcification. Skull: Normal. Negative for fracture or focal lesion.  Sinuses/Orbits: No acute finding. Other: Sebaceous cyst is noted in the posterior left neck at the craniocervical junction. IMPRESSION: No acute intracranial abnormality noted. Electronically Signed   By: Inez Catalina M.D.   On: 02/07/2019 18:46    ____________________________________________    PROCEDURES  Procedure(s) performed:    Procedures    Medications  acetaminophen (TYLENOL) tablet 650 mg (650 mg Oral Given 02/07/19 1856)     ____________________________________________   INITIAL IMPRESSION / ASSESSMENT AND PLAN / ED COURSE  Pertinent labs & imaging results that were available during my care of the patient were reviewed by me and considered in my medical decision making (see chart for details).  Review of the [place] CSRS was performed in accordance of the [place] prior to dispensing any controlled drugs.           Assessment and plan Fall Patient presents to the emergency department after a fall resulting in head injury that occurred 3 hours ago.  Patient had no loss of consciousness and had a reassuring neuro exam.  Differential diagnosis included subdural hematoma, subarachnoid hemorrhage, skull fracture and concussion.  CT head revealed no acute abnormality.  Patient was given Tylenol in the emergency department.  He was advised to follow-up with primary care as needed.  All patient questions were answered.     ____________________________________________  FINAL CLINICAL IMPRESSION(S) / ED DIAGNOSES  Final diagnoses:  Injury of head, initial encounter      NEW MEDICATIONS STARTED DURING THIS VISIT:  ED Discharge Orders    None          This chart was dictated using voice recognition software/Dragon. Despite best efforts to proofread, errors can occur which can change the meaning. Any change was purely unintentional.    Lannie Fields, PA-C 02/07/19 1925    Arta Silence, MD 02/07/19 [id number]

## 2019-02-07 NOTE — ED Notes (Signed)
As per patient out hiking today slipped off a rock and hit right side of head, no open lac noted, patient denies loc, denies use of blood thinners. Awaiting md eval and plan oof care,

## 2019-02-07 NOTE — ED Triage Notes (Signed)
See first nurse note. 

## 2019-02-07 NOTE — ED Notes (Signed)
Off unit to ct head.

## 2019-02-07 NOTE — ED Notes (Signed)
First Nurse Note: Pt to ED via POV. Pt states that he was at [facility] walking on a trail, he tripped over a root and rolled down a hill, hitting his head on a rock. Pt denies LOC, pt is not on blood thinners. Pt is in NAD.

## 2019-02-07 NOTE — ED Notes (Signed)
Ct head negative patient given po meds and discharged to home with follow instructions.

## 2019-02-09 ENCOUNTER — Encounter: Payer: Self-pay | Admitting: Family Medicine

## 2019-02-26 ENCOUNTER — Encounter: Payer: Self-pay | Admitting: Family Medicine

## 2019-02-26 ENCOUNTER — Other Ambulatory Visit: Payer: Self-pay | Admitting: Family Medicine

## 2019-02-26 DIAGNOSIS — R1013 Epigastric pain: Secondary | ICD-10-CM

## 2019-02-26 MED ORDER — PANTOPRAZOLE SODIUM 40 MG PO TBEC: DELAYED_RELEASE_TABLET | ORAL | 2 refills | Status: DC

## 2019-03-24 ENCOUNTER — Telehealth: Payer: Self-pay | Admitting: Family Medicine

## 2019-03-24 NOTE — Chronic Care Management (AMB) (Signed)
Chronic Care Management   Note  03/24/2019 Name: Steven Miranda MRN: [id number] DOB: Feb 11, 1946  Steven Miranda is a 73 y.o. year old male who is a primary care patient of Chrismon, Vickki Muff, [place]. I reached out to Jarome Matin by phone today in response to a referral sent by [name] [facility] health plan.    [name] was given information about Chronic Care Management services today including:  1. CCM service includes personalized support from designated clinical staff supervised by his physician, including individualized plan of care and coordination with other care providers 2. 24/7 contact phone numbers for assistance for urgent and routine care needs. 3. Service will only be billed when office clinical staff spend 20 minutes or more in a month to coordinate care. 4. Only one practitioner may furnish and bill the service in a calendar month. 5. The patient may stop CCM services at any time (effective at the end of the month) by phone call to the office staff. 6. The patient will be responsible for cost sharing (co-pay) of up to 20% of the service fee (after annual deductible is met).  Patient agreed to services and verbal consent obtained.   Follow up plan: Telephone appointment with CCM team member scheduled for: 04/12/2019  [place]  ??[email or web address]   ??[phone]

## 2019-04-12 ENCOUNTER — Encounter: Payer: Medicare HMO | Admitting: Pharmacist

## 2019-04-12 ENCOUNTER — Ambulatory Visit: Payer: Self-pay | Admitting: Pharmacist

## 2019-04-12 DIAGNOSIS — E782 Mixed hyperlipidemia: Secondary | ICD-10-CM

## 2019-04-12 DIAGNOSIS — E119 Type 2 diabetes mellitus without complications: Secondary | ICD-10-CM

## 2019-04-12 NOTE — Patient Instructions (Signed)
Thank you for taking the time to speak with me today!!!    Please call a member of the CCM (Chronic Care Management) Team with any questions or case management needs:   Vanetta Mulders, BSN Nurse Care Coordinator  [phone]  Ruben Reason, PharmD  Clinical Pharmacist  [phone]  Elliot Gurney, [place] Social Worker [phone]   Goals Addressed            This Visit's Progress   . I have reached the donut hole (pt-stated)       Current Barriers:  . financial  Pharmacist Clinical Goal(s): Over the next 14 days, Mr.. Anastasi will provide the necessary supplementary documents (proof of out of pocket prescription expenditure, proof of household income) needed for medication assistance applications to CCM pharmacist.   Interventions: . CCM pharmacist will apply for medication assistance program for Jardiance made by [employer]   Patient Self Care Activities:  Marland Kitchen Gather necessary documents needed to apply for medication assistance  Initial goal documentation        The patient verbalized understanding of instructions provided today and declined a print copy of patient instruction materials.

## 2019-04-12 NOTE — Chronic Care Management (AMB) (Signed)
  Chronic Care Management   Note  04/12/2019 Name: Steven Miranda MRN: [id number] DOB: 23-Nov-1945  73 y.o. year old male referred to Chronic Care Management by health plan, initial pharamacy visit scheduled for today.  Last office visit with Margo Common, PA was 12/22/18.   Was unable to reach patient via telephone today and have left HIPAA compliant voicemail asking patient to return my call. (unsuccessful outreach #1).  Follow up plan: A HIPPA compliant phone message was left for the patient providing contact information and requesting a return call.  The care management team will reach out to the patient again over the next 5-7 days.   Ruben Reason, PharmD Clinical Pharmacist [place] [phone]

## 2019-04-12 NOTE — Chronic Care Management (AMB) (Signed)
Chronic Care Management   Note  04/12/2019 Name: KIPLING GRASER MRN: [id number] DOB: Jan 03, 1946    Chronic Care Management   Note  04/12/2019 Name: JANTZEN PILGER MRN: [id number] DOB: 11/25/1945  Subjective: Reason for referral: Medication assistance   Referral source: health plan Referral medication(s): Jardiance Current insurance:[employer]  Mr. Teems returning earlier call. HIPAA identifiers verified.   Objective: No Known Allergies  Medications Reviewed Today    Reviewed by Margo Common, PA (Physician Assistant) on 12/22/18 at [place] List Status: <None>  Medication Order Taking? Sig Documenting Provider Last Dose Status Informant  ACCU-CHEK SMARTVIEW test strip [id number] No CHECK  FASTING  BLOOD  SUGAR EVERY MORNING AS INSTRUCTED Chrismon, Vickki Muff, PA Taking Active   amLODipine (NORVASC) 5 MG tablet [id number]  Take 1 tablet (5 mg total) by mouth at bedtime. Chrismon, Vickki Muff, [place]  Active   aspirin 81 MG tablet [id number] No Take 1 tablet by mouth daily. [provider] Taking Active            Med Note Jules Schick   Tue Nov 25, 2017  1:59 PM)    atorvastatin (LIPITOR) 20 MG tablet [id number]  Take 1 tablet (20 mg total) by mouth daily. Chrismon, Vickki Muff, PA  Active   Blood Glucose Monitoring Suppl (ACCU-CHEK NANO SMARTVIEW) w/Device KIT [id number] No Use glucometer (Accu-Chek Nano Glucometer) to test fasting glucose daily. Chrismon, Vickki Muff, PA Taking Active   empagliflozin (JARDIANCE) 10 MG TABS tablet [id number]  Take 10 mg by mouth daily. Chrismon, Vickki Muff, PA  Active   Ferrous Sulfate (IRON SUPPLEMENT PO) [id number] No Take 1 tablet by mouth daily. [provider] Taking Active   fluticasone (FLONASE) 50 MCG/ACT nasal spray [id number] No Place 2 sprays into both nostrils daily.  Patient taking differently: Place 2 sprays into both nostrils daily.    Trinna Post, PA-C Taking Active   Lancets [facility] ULTRASOFT) lancets [id number] No  Use as instructed once a day for fasting glucose check. Chrismon, Vickki Muff, PA Taking Active   lidocaine (XYLOCAINE) 2 % solution [id number] No Take 5 ml mixed with 5 ml of Mylanta 2-3 times a day as needed for acute acid indigestion. Chrismon, Vickki Muff, PA Taking Active   meloxicam (MOBIC) 15 MG tablet [id number] No meloxicam 15 mg tablet  TAKE 1 TABLET BY MOUTH EVERY DAY [provider] Taking Active   metFORMIN (GLUCOPHAGE) 1000 MG tablet [id number]  Take 1 tablet (1,000 mg total) by mouth 2 (two) times daily with a meal. Chrismon, Vickki Muff, PA  Active   Misc Natural Products (OSTEO BI-FLEX ADV DOUBLE ST) CAPS [id number] No Take 1 capsule by mouth daily. [provider] Taking Active            Med Note [place] Nov 25, 2017  1:59 PM)    pantoprazole (PROTONIX) 40 MG tablet [id number]  TAKE 1 TABLET(40 MG) BY MOUTH DAILY Chrismon, Vickki Muff, PA  Active   Probiotic Product (PROBIOTIC FORMULA) CAPS [id number] No Take by mouth daily. [provider] Taking Active           Medication Assistance Findings:  Extra Help:   []  Already receiving Full Extra Help  []  Already receiving Partial Extra Help  []  Eligible based on reported income and assets  [x]  Not Eligible based on reported income and assets  Patient Assistance Programs: 1) Jardiance made by [employer] o Income requirement met: []  Yes []   No [x]  Unknown o Out-of-pocket prescription expenditure met:    []  Yes []  No  []  Unknown  [x]  Not applicable    Additional medication assistance options reviewed with patient as warranted:  Insurance OTC catalogue  Goals Addressed            This Visit's Progress   . I have reached the donut hole (pt-stated)       Current Barriers:  . financial  Pharmacist Clinical Goal(s): Over the next 14 days, Mr.. Maret will provide the necessary supplementary documents (proof of out of pocket prescription expenditure, proof of household income) needed for medication  assistance applications to CCM pharmacist.   Interventions: . CCM pharmacist will apply for medication assistance program for Jardiance made by [place]   Patient Self Care Activities:  Marland Kitchen Gather necessary documents needed to apply for medication assistance  Initial goal documentation         Plan: Patient is to provide additional documents as necessary.   Follow up:  2 weeks  Ruben Reason, PharmD Clinical Pharmacist [place] [phone]

## 2019-04-12 NOTE — Progress Notes (Signed)
This encounter was created in error - please disregard.

## 2019-04-20 ENCOUNTER — Ambulatory Visit: Payer: Self-pay | Admitting: Pharmacist

## 2019-04-20 DIAGNOSIS — I10 Essential (primary) hypertension: Secondary | ICD-10-CM

## 2019-04-20 DIAGNOSIS — E119 Type 2 diabetes mellitus without complications: Secondary | ICD-10-CM

## 2019-04-20 NOTE — Chronic Care Management (AMB) (Signed)
  Chronic Care Management   Follow Up Note   04/20/2019 Name: Steven Miranda MRN: [id number] DOB: March 23, 1946  Referred by: Margo Common, PA Reason for referral : Chronic Care Management   Steven Miranda is a 73 y.o. year old male who is a primary care patient of Chrismon, Vickki Muff, [place]. The CCM team was consulted for assistance with chronic disease management and care coordination needs.    Review of patient status, including review of consultants reports, relevant laboratory and other test results, and collaboration with appropriate care team members and the patient's provider was performed as part of comprehensive patient evaluation and provision of chronic care management services.    Goals Addressed            This Visit's Progress   . I have reached the donut hole (pt-stated)       Current Barriers:  . financial  Pharmacist Clinical Goal(s): Over the next 14 days, Steven Miranda will provide the necessary supplementary documents (proof of out of pocket prescription expenditure, proof of household income) needed for medication assistance applications to CCM pharmacist.   Interventions: . CCM pharmacist will apply for medication assistance program for Jardiance made by [employer] o Updated 7/21: clarification with patient on which pages of the federal tax return are needed to provide proof of household financial income  Patient Self Care Activities:  Marland Kitchen Gather necessary documents needed to apply for medication assistance  Please see past updates related to this goal by clicking on the "Past Updates" button in the selected goal          Telephone follow up appointment with care management team member scheduled for: 7/27 as scheduled with PharmD  Ruben Reason, PharmD Clinical Pharmacist [place] [phone]

## 2019-04-26 ENCOUNTER — Ambulatory Visit: Payer: Self-pay | Admitting: Pharmacist

## 2019-04-26 DIAGNOSIS — E782 Mixed hyperlipidemia: Secondary | ICD-10-CM

## 2019-04-26 DIAGNOSIS — E119 Type 2 diabetes mellitus without complications: Secondary | ICD-10-CM

## 2019-04-26 DIAGNOSIS — I10 Essential (primary) hypertension: Secondary | ICD-10-CM

## 2019-04-26 NOTE — Chronic Care Management (AMB) (Signed)
   Care Coordination  Note  04/26/2019 Name: Steven Miranda MRN: [id number] DOB: 1946/02/19  Subjective: Reason for referral: Medication assistance   Referral source: health plan Referral medication(s): Jardiance Current insurance:[employer]  Goals Addressed            This Visit's Progress   . I have reached the donut hole (pt-stated)   On track    Current Barriers:  . financial  Pharmacist Clinical Goal(s): Over the next 14 days, [name] will provide the necessary supplementary documents (proof of out of pocket prescription expenditure, proof of household income) needed for medication assistance applications to CCM pharmacist.   Interventions: . CCM pharmacist will apply for medication assistance program for Jardiance made by [employer] o Updated 7/21: clarification with patient on which pages of the federal tax return are needed to provide proof of household financial income o Updated 7/27: Received patient portion of application o   Patient Self Care Activities:  Marland Kitchen Gather necessary documents needed to apply for medication assistance  Please see past updates related to this goal by clicking on the "Past Updates" button in the selected goal         Plan: Follow up on application status with [employer]   Follow up:  2 weeks  Ruben Reason, PharmD Clinical Pharmacist [place] [phone]

## 2019-04-30 ENCOUNTER — Ambulatory Visit: Payer: Self-pay | Admitting: Pharmacist

## 2019-04-30 DIAGNOSIS — E119 Type 2 diabetes mellitus without complications: Secondary | ICD-10-CM

## 2019-04-30 DIAGNOSIS — I10 Essential (primary) hypertension: Secondary | ICD-10-CM

## 2019-04-30 NOTE — Chronic Care Management (AMB) (Signed)
  Chronic Care Management   Care Coordination Note  04/30/2019 Name: SHAROD PETSCH MRN: [id number] DOB: November 08, 1945  Received prescription/prescriber portion of Jardiance/[employer] application. Faxed to [employer] and uploaded under media tab.   Goals Addressed            This Visit's Progress   . I have reached the donut hole (pt-stated)       Current Barriers:  . financial  Pharmacist Clinical Goal(s): Over the next 14 days, Mr.. Mclear will provide the necessary supplementary documents (proof of out of pocket prescription expenditure, proof of household income) needed for medication assistance applications to CCM pharmacist.   Interventions: . CCM pharmacist will apply for medication assistance program for Jardiance made by [employer] o Updated 7/21: clarification with patient on which pages of the federal tax return are needed to provide proof of household financial income o Updated 7/27: Received patient portion of application o Updated 4/25: faxed application and uploaded under media tab  Patient Self Care Activities:  Marland Kitchen Gather necessary documents needed to apply for medication assistance  Please see past updates related to this goal by clicking on the "Past Updates" button in the selected goal          Follow up plan: The care management team will reach out to the patient again over the next 5-7 days.   Ruben Reason, PharmD Clinical Pharmacist [place] [phone]

## 2019-05-10 ENCOUNTER — Ambulatory Visit: Payer: Self-pay | Admitting: Pharmacist

## 2019-05-10 DIAGNOSIS — E119 Type 2 diabetes mellitus without complications: Secondary | ICD-10-CM

## 2019-05-10 DIAGNOSIS — I10 Essential (primary) hypertension: Secondary | ICD-10-CM

## 2019-05-10 DIAGNOSIS — E782 Mixed hyperlipidemia: Secondary | ICD-10-CM

## 2019-05-13 ENCOUNTER — Ambulatory Visit (INDEPENDENT_AMBULATORY_CARE_PROVIDER_SITE_OTHER): Payer: Medicare HMO | Admitting: Pharmacist

## 2019-05-13 DIAGNOSIS — I10 Essential (primary) hypertension: Secondary | ICD-10-CM

## 2019-05-13 DIAGNOSIS — E782 Mixed hyperlipidemia: Secondary | ICD-10-CM | POA: Diagnosis not present

## 2019-05-13 DIAGNOSIS — E119 Type 2 diabetes mellitus without complications: Secondary | ICD-10-CM | POA: Diagnosis not present

## 2019-05-13 NOTE — Chronic Care Management (AMB) (Signed)
  Chronic Care Management   Follow up Note  05/13/2019  Name: THANIEL COLUCCIO MRN: [id number] DOB: 12-31-1945  Mr. Namari Breton is a 73 year old male patient referred to Chronic Care Management engaged with [facility] pharmacist for medication assistance . Chronic conditions include T2DM.    Goals Addressed            This Visit's Progress   . I have reached the donut hole (pt-stated)       Current Barriers:  . financial  Pharmacist Clinical Goal(s): Over the next 14 days, Mr.. Wilkinson will provide the necessary supplementary documents (proof of out of pocket prescription expenditure, proof of household income) needed for medication assistance applications to [facility] pharmacist.   Interventions: . [facility] pharmacist will apply for medication assistance program for Jardiance made by [place] o Updated 8/10:  patient is over income and does not qualify for Jardiance assistance;   o based on reported income, he will also be overincome for alternatives such as Trulicity and Ozempic; will coordinate with provider Simona Huh Chrismon regarding generic alternatives such as pioglitazone or glipizide XL; noted history of cancer in patient's mother, though unknown what type of cancer - Update 8/13: mother had breast cancer; patient interested in cardio benefits offered by Jardiance (none proven with glipizide or pioglitazone)  Patient Self Care Activities:  Marland Kitchen Gather necessary documents needed to apply for medication assistance  Please see past updates related to this goal by clicking on the "Past Updates" button in the selected goal          Follow up plan: The care management team will reach out to the patient again over the next 5-7 days.   Ruben Reason, PharmD Clinical Pharmacist [place] [phone]

## 2019-05-13 NOTE — Chronic Care Management (AMB) (Signed)
  Chronic Care Management   Follow up Note  05/13/2019 Late Entry  Name: Steven Miranda MRN: [id number] DOB: 04-17-1946  Steven Miranda is a 73 year old male patient referred to Chronic Care Management engaged with [facility] pharmacist for medication assistance . Chronic conditions include T2DM.   Was unable to reach patient via telephone today and have left HIPAA compliant voicemail asking patient to return my call. (unsuccessful outreach #1).    Goals Addressed            This Visit's Progress   . I have reached the donut hole (pt-stated)       Current Barriers:  . financial  Pharmacist Clinical Goal(s): Over the next 14 days, Steven Miranda will provide the necessary supplementary documents (proof of out of pocket prescription expenditure, proof of household income) needed for medication assistance applications to [facility] pharmacist.   Interventions: . [facility] pharmacist will apply for medication assistance program for Jardiance made by [place] o Updated 8/10:  patient is over income and does not qualify for Jardiance assistance;   o based on reported income, he will also be overincome for alternatives such as Trulicity and Ozempic; will coordinate with provider Simona Huh Chrismon regarding generic alternatives such as pioglitazone or glipizide XL; noted history of cancer in patient's mother, though unknown what type of cancer  Patient Self Care Activities:  Marland Kitchen Gather necessary documents needed to apply for medication assistance  Please see past updates related to this goal by clicking on the "Past Updates" button in the selected goal          Follow up plan: The care management team will reach out to the patient again over the next 5-7 days.   Ruben Reason, PharmD Clinical Pharmacist [place] [phone]

## 2019-05-17 ENCOUNTER — Ambulatory Visit: Payer: Self-pay | Admitting: Pharmacist

## 2019-05-17 DIAGNOSIS — E782 Mixed hyperlipidemia: Secondary | ICD-10-CM

## 2019-05-17 DIAGNOSIS — E119 Type 2 diabetes mellitus without complications: Secondary | ICD-10-CM

## 2019-05-17 NOTE — Chronic Care Management (AMB) (Signed)
  Chronic Care Management   Note  05/17/2019 Name: COLON RUETH MRN: [id number] DOB: [birth year]  Following up with patient via telephone (HIPAA identifiers verified) to that Vernie Murders, PA would like for him to schedule an appointment and come in for fasting labs to discuss switch from Jardiance to pioglitazone 15mg  once daily.    Follow up plan: The patient will call [facility] at [phone]* as advised to schedule apointment with Simona Huh.    Ruben Reason, PharmD Clinical Pharmacist [place] [phone]

## 2019-05-28 ENCOUNTER — Other Ambulatory Visit: Payer: Self-pay

## 2019-05-28 ENCOUNTER — Encounter: Payer: Self-pay | Admitting: Family Medicine

## 2019-05-28 ENCOUNTER — Ambulatory Visit (INDEPENDENT_AMBULATORY_CARE_PROVIDER_SITE_OTHER): Payer: Medicare HMO | Admitting: Family Medicine

## 2019-05-28 VITALS — BP 120/62 | HR 60 | Temp 98.0°F | Resp 16 | Ht 74.0 in | Wt 251.0 lb

## 2019-05-28 DIAGNOSIS — E782 Mixed hyperlipidemia: Secondary | ICD-10-CM | POA: Diagnosis not present

## 2019-05-28 DIAGNOSIS — I10 Essential (primary) hypertension: Secondary | ICD-10-CM

## 2019-05-28 DIAGNOSIS — E119 Type 2 diabetes mellitus without complications: Secondary | ICD-10-CM | POA: Diagnosis not present

## 2019-05-28 MED ORDER — PIOGLITAZONE HCL 15 MG PO TABS: 15.0000 mg | ORAL_TABLET | Freq: Every day | ORAL | Status: DC

## 2019-05-28 NOTE — Progress Notes (Signed)
Patient: Steven Miranda Male    DOB: Apr 23, 1946   73 y.o.   MRN: [id number] Visit Date: 05/28/2019  Today's Provider: Vernie Murders, PA   Chief Complaint  Patient presents with  . Hypertension  . Diabetes  . Hyperlipidemia   Subjective:   HPI  Hypertension, follow-up:  BP Readings from Last 3 Encounters:  05/28/19 120/62  02/07/19 (!) 149/86  01/29/19 140/72     He was last seen for hypertension 5 months ago.  BP at that visit was 140/72. Management since that visit includes no changes. He reports good compliance with treatment. He is not having side effects.  He is exercising. He is adherent to low salt diet.   Outside blood pressures are checked occasionally. He is experiencing none.  Patient denies exertional chest pressure/discomfort, lower extremity edema and palpitations.   Cardiovascular risk factors include diabetes mellitus and dyslipidemia.   Weight trend: stable Wt Readings from Last 3 Encounters:  05/28/19 251 lb (113.9 kg)  02/07/19 250 lb (113.4 kg)  12/22/18 253 lb (114.8 kg)    Current diet: well balanced   Diabetes Mellitus Type II, Follow-up:   Lab Results  Component Value Date   HGBA1C 7.5 (A) 12/22/2018   HGBA1C 7.5 (H) 06/17/2018   HGBA1C 7.3 (H) 03/10/2018    Last seen for diabetes 5 months ago.  Management since then includes changing from Jardiance to pioglitazone 34m daily. He reports good compliance with treatment. He is not having side effects.  Current symptoms include none and have been stable. Home blood sugar records: trend: fluctuating a bit  Episodes of hypoglycemia? no Most Recent Eye Exam: up to date   Lipid/Cholesterol, Follow-up:   Last seen for this5 months ago.  Management changes since that visit include no changes. . Last Lipid Panel:    Component Value Date/Time   CHOL 139 06/17/2018 0829   TRIG 49 06/17/2018 0829   HDL 60 06/17/2018 0829   CHOLHDL 2.3 06/17/2018 0829   LDLCALC 69  06/17/2018 0829    Risk factors for vascular disease include diabetes mellitus and hypertension  He reports good compliance with treatment. He is not having side effects.  Current symptoms include none and have been stable.  No Known Allergies   Current Outpatient Medications:  .  ACCU-CHEK SMARTVIEW test strip, CHECK  FASTING  BLOOD  SUGAR EVERY MORNING AS INSTRUCTED, Disp: 100 each, Rfl: 4 .  amLODipine (NORVASC) 5 MG tablet, Take 1 tablet (5 mg total) by mouth at bedtime., Disp: 90 tablet, Rfl: 3 .  aspirin 81 MG tablet, Take 1 tablet by mouth daily., Disp: , Rfl:  .  atorvastatin (LIPITOR) 20 MG tablet, Take 1 tablet (20 mg total) by mouth daily., Disp: 90 tablet, Rfl: 3 .  Blood Glucose Monitoring Suppl (ACCU-CHEK NANO SMARTVIEW) w/Device KIT, Use glucometer (Accu-Chek Nano Glucometer) to test fasting glucose daily., Disp: 1 kit, Rfl: 0 .  empagliflozin (JARDIANCE) 10 MG TABS tablet, Take 10 mg by mouth daily., Disp: 90 tablet, Rfl: 3 .  Ferrous Sulfate (IRON SUPPLEMENT PO), Take 1 tablet by mouth daily., Disp: , Rfl:  .  fluticasone (FLONASE) 50 MCG/ACT nasal spray, Place 2 sprays into both nostrils daily. (Patient taking differently: Place 2 sprays into both nostrils daily. ), Disp: 16 g, Rfl: 6 .  hyoscyamine (LEVSIN SL) 0.125 MG SL tablet, Place 1 tablet (0.125 mg total) under the tongue every 6 (six) hours as needed., Disp: 30 tablet, Rfl: 1 .  Lancets (ONETOUCH ULTRASOFT) lancets, Use as instructed once a day for fasting glucose check., Disp: 100 each, Rfl: 3 .  lidocaine (XYLOCAINE) 2 % solution, Take 5 ml mixed with 5 ml of Mylanta 2-3 times a day as needed for acute acid indigestion., Disp: 100 mL, Rfl: 0 .  metFORMIN (GLUCOPHAGE) 1000 MG tablet, Take 1 tablet (1,000 mg total) by mouth 2 (two) times daily with a meal., Disp: 180 tablet, Rfl: 3 .  Misc Natural Products (OSTEO BI-FLEX ADV DOUBLE ST) CAPS, Take 1 capsule by mouth daily., Disp: , Rfl:  .  pantoprazole (PROTONIX)  40 MG tablet, TAKE 1 TABLET(40 MG) BY MOUTH DAILY, Disp: 90 tablet, Rfl: 2 .  Probiotic Product (PROBIOTIC FORMULA) CAPS, Take by mouth daily., Disp: , Rfl:  .  sucralfate (CARAFATE) 1 g tablet, Take 1 tablet (1 g total) by mouth 4 (four) times daily -  with meals and at bedtime., Disp: 40 tablet, Rfl: 1  Review of Systems  Constitutional: Negative for activity change and fatigue.  Respiratory: Negative for cough and shortness of breath.   Cardiovascular: Negative for palpitations and leg swelling.  Gastrointestinal: Negative.   Endocrine: Negative for cold intolerance, heat intolerance, polydipsia, polyphagia and polyuria.  Musculoskeletal: Negative for arthralgias and myalgias.  Neurological: Negative for dizziness, light-headedness and headaches.  Psychiatric/Behavioral: Negative for agitation, confusion, decreased concentration, self-injury, sleep disturbance and suicidal ideas. The patient is not nervous/anxious.     Social History   Tobacco Use  . Smoking status: Former Smoker    Types: Cigarettes  . Smokeless tobacco: Never Used  . Tobacco comment: quit in 1978  Substance Use Topics  . Alcohol use: Yes    Alcohol/week: 4.0 standard drinks    Types: 4 Cans of beer per week      Objective:   BP 120/62   Pulse 60   Temp 98 F (36.7 C)   Resp 16   Ht 6' 2"  (1.88 m)   Wt 251 lb (113.9 kg)   SpO2 97%   BMI 32.23 kg/m  Vitals:   05/28/19 1012  BP: 120/62  Pulse: 60  Resp: 16  Temp: 98 F (36.7 C)  SpO2: 97%  Weight: 251 lb (113.9 kg)  Height: 6' 2"  (1.88 m)   Wt Readings from Last 3 Encounters:  05/28/19 251 lb (113.9 kg)  02/07/19 250 lb (113.4 kg)  12/22/18 253 lb (114.8 kg)   Physical Exam Constitutional:      General: He is not in acute distress.    Appearance: He is well-developed.  HENT:     Head: Normocephalic and atraumatic.     Right Ear: Hearing normal.     Left Ear: Hearing normal.     Nose: Nose normal.  Eyes:     General: Lids are normal.  No scleral icterus.       Right eye: No discharge.        Left eye: No discharge.     Conjunctiva/sclera: Conjunctivae normal.  Cardiovascular:     Rate and Rhythm: Normal rate and regular rhythm.     Pulses: Normal pulses.     Heart sounds: Normal heart sounds.  Pulmonary:     Effort: Pulmonary effort is normal. No respiratory distress.  Musculoskeletal: Normal range of motion.  Skin:    Findings: No lesion or rash.  Neurological:     Mental Status: He is alert and oriented to person, place, and time.  Psychiatric:  Speech: Speech normal.        Behavior: Behavior normal.        Thought Content: Thought content normal.    Diabetic Foot Form - Detailed   Diabetic Foot Exam - detailed Diabetic Foot exam was performed with the following findings: Yes 05/28/2019 11:26 AM  Visual Foot Exam completed.: Yes  Can the patient see the bottom of their feet?: Yes Are the shoes appropriate in style and fit?: Yes Is there swelling or and abnormal foot shape?: No Is there a claw toe deformity?: No Is there elevated skin temparature?: No Is there foot or ankle muscle weakness?: No Normal Range of Motion: Yes Pulse Foot Exam completed.: Yes  Right posterior Tibialias: Present Left posterior Tibialias: Present  Right Dorsalis Pedis: Present Left Dorsalis Pedis: Present  Sensory Foot Exam Completed.: Yes Semmes-Weinstein Monofilament Test R Site 1-Great Toe: Pos L Site 1-Great Toe: Pos    Comments: Questionable early hammertoe deformity bilateral 2nd and 3rd toes.       Assessment & Plan    1. Type 2 diabetes mellitus without complication, without long-term current use of insulin (HCC) Presently taking Metformin 1000 mg qd and Jardiance 10 mg qd with last Hgb A1C 7.5% on 12-22-18. Has been trying to get help procuring the Jardiance through the [facility] pharmacist but found he does not qualify for assistance. Will try to switch to Actos 15 mg qd with the Metformin, diet and exercise plan.  Have tried to get Invokana in the past but found it too expensive with poor insurance coverage just as has happened with the Jardiance. Recheck routine labs and follow up pending reports. Given prescription for Actos and will titrate as needed. - CBC with Differential/Platelet - Comprehensive metabolic panel - Lipid panel - Hemoglobin A1c - pioglitazone (ACTOS) 15 MG tablet; Take 1 tablet (15 mg total) by mouth daily.  Dispense: 90 tablet; Refill: 03  2. Mixed hyperlipidemia Tolerating the Atorvastatin 20 mg qd without side effects. Recheck CMP, TSH and Lipid Panel. Continue low fat diet and work on some increased exercise daily. Recheck pending reports. - Comprehensive metabolic panel - Lipid panel - TSH  3. Essential (primary) hypertension Very good control and continues Amlodipine 5 mg hs without side effects. No edema, palpitations or chest pains. Will recheck labs and continue present dosage. - CBC with Differential/Platelet - Comprehensive metabolic panel - Lipid panel - TSH     Vernie Murders, PA  [facility]

## 2019-05-29 LAB — COMPREHENSIVE METABOLIC PANEL
ALT: 28 IU/L (ref 0–44)
AST: 24 IU/L (ref 0–40)
Albumin/Globulin Ratio: 2.4 — ABNORMAL HIGH (ref 1.2–2.2)
Albumin: 4.7 g/dL (ref 3.7–4.7)
Alkaline Phosphatase: 63 IU/L (ref 39–117)
BUN/Creatinine Ratio: 15 (ref 10–24)
BUN: 13 mg/dL (ref 8–27)
Bilirubin Total: 0.8 mg/dL (ref 0.0–1.2)
CO2: 22 mmol/L (ref 20–29)
Calcium: 9.4 mg/dL (ref 8.6–10.2)
Chloride: 101 mmol/L (ref 96–106)
Creatinine, Ser: 0.88 mg/dL (ref 0.76–1.27)
GFR calc Af Amer: 100 mL/min/{1.73_m2} (ref >59)
GFR calc non Af Amer: 86 mL/min/{1.73_m2} (ref >59)
Globulin, Total: 2 g/dL (ref 1.5–4.5)
Glucose: 147 mg/dL — ABNORMAL HIGH (ref 65–99)
Potassium: 4.3 mmol/L (ref 3.5–5.2)
Sodium: 139 mmol/L (ref 134–144)
Total Protein: 6.7 g/dL (ref 6.0–8.5)

## 2019-05-29 LAB — CBC WITH DIFFERENTIAL/PLATELET
Basophils Absolute: 0 10*3/uL (ref 0.0–0.2)
Basos: 0 %
EOS (ABSOLUTE): 0.1 10*3/uL (ref 0.0–0.4)
Eos: 2 %
Hematocrit: 43.5 % (ref 37.5–51.0)
Hemoglobin: 15.3 g/dL (ref 13.0–17.7)
Immature Grans (Abs): 0 10*3/uL (ref 0.0–0.1)
Immature Granulocytes: 0 %
Lymphocytes Absolute: 1.2 10*3/uL (ref 0.7–3.1)
Lymphs: 26 %
MCH: 31.4 pg (ref 26.6–33.0)
MCHC: 35.2 g/dL (ref 31.5–35.7)
MCV: 89 fL (ref 79–97)
Monocytes Absolute: 0.5 10*3/uL (ref 0.1–0.9)
Monocytes: 11 %
Neutrophils Absolute: 2.8 10*3/uL (ref 1.4–7.0)
Neutrophils: 61 %
Platelets: 240 10*3/uL (ref 150–450)
RBC: 4.87 x10E6/uL (ref 4.14–5.80)
RDW: 13.1 % (ref 11.6–15.4)
WBC: 4.7 10*3/uL (ref 3.4–10.8)

## 2019-05-29 LAB — LIPID PANEL
Chol/HDL Ratio: 2.5 ratio (ref 0.0–5.0)
Cholesterol, Total: 139 mg/dL (ref 100–199)
HDL: 56 mg/dL (ref >39)
LDL Calculated: 69 mg/dL (ref 0–99)
Triglycerides: 71 mg/dL (ref 0–149)
VLDL Cholesterol Cal: 14 mg/dL (ref 5–40)

## 2019-05-29 LAB — TSH: TSH: 2.83 u[IU]/mL (ref 0.450–4.500)

## 2019-05-29 LAB — HEMOGLOBIN A1C
Est. average glucose Bld gHb Est-mCnc: 166 mg/dL
Hgb A1c MFr Bld: 7.4 % — ABNORMAL HIGH (ref 4.8–5.6)

## 2019-05-31 ENCOUNTER — Telehealth: Payer: Self-pay

## 2019-05-31 NOTE — Telephone Encounter (Signed)
-----   Message from [place], [place] sent at 05/30/2019 11:25 PM EDT ----- Labs show stable blood sugar and Hgb A1C. Continue present medications with change to Actos. Recheck labs and progress in 3 months.

## 2019-05-31 NOTE — Telephone Encounter (Signed)
Patient advised as below.  

## 2019-06-11 ENCOUNTER — Ambulatory Visit: Payer: Medicare HMO

## 2019-06-14 ENCOUNTER — Encounter: Payer: Self-pay | Admitting: Family Medicine

## 2019-06-14 ENCOUNTER — Other Ambulatory Visit: Payer: Self-pay

## 2019-06-14 MED ORDER — TRUE METRIX BLOOD GLUCOSE TEST VI STRP: ORAL_STRIP | 12 refills | Status: DC

## 2019-06-15 NOTE — Progress Notes (Addendum)
Subjective:   Steven Miranda is a 73 y.o. male who presents for Medicare Annual/Subsequent preventive examination.    This visit is being conducted through telemedicine due to the COVID-19 pandemic. This patient has given me verbal consent via doximity to conduct this visit, patient states they are participating from their home address. Some vital signs may be absent or patient reported.    Patient identification: identified by name, DOB, and current address  Review of Systems:  N/A  Cardiac Risk Factors include: advanced age (>28mn, >>75women);diabetes mellitus;dyslipidemia;hypertension;male gender     Objective:    Vitals: There were no vitals taken for this visit.  There is no height or weight on file to calculate BMI. Unable to obtain vitals due to visit being conducted via telephonically.   Advanced Directives 06/16/2019 02/07/2019 07/03/2018 06/05/2018 10/26/2017 04/29/2017 05/10/2016  Does Patient Have a Medical Advance Directive? _0  No No  Would patient like information on creating a medical advance directive? No - Patient declined No - Patient declined No - Patient declined No - Patient declined - Yes (ED - Information included in AVS) -    Tobacco Social History   Tobacco Use  Smoking Status Former Smoker  . Types: Cigarettes  Smokeless Tobacco Never Used  Tobacco Comment   quit in 1978     Counseling given: Not Answered Comment: quit in 1978   Clinical Intake:  Pre-visit preparation completed: Yes  Pain : No/denies pain Pain Score: 0-No pain     Nutritional Risks: None Diabetes: Yes  How often do you need to have someone help you when you read instructions, pamphlets, or other written materials from your doctor or pharmacy?: 1 - Never   Diabetes:  Is the patient diabetic?  Yes type 2 If diabetic, was a CBG obtained today?  No  Did the patient bring in their glucometer from home?  No  How often do you monitor your CBG's? Once a day in the  morning.   Financial Strains and Diabetes Management:  Are you having any financial strains with the device, your supplies or your medication? No .  Does the patient want to be seen by Chronic Care Management for management of their diabetes?  No  Would the patient like to be referred to a Nutritionist or for Diabetic Management?  No   Diabetic Exams:  Diabetic Eye Exam: Completed 05/2018. Overdue for diabetic eye exam. Pt has been advised about the importance in completing this exam. Pt to schedule apt for this fall.   Diabetic Foot Exam: Completed 05/28/19. Repeat yearly.  Interpreter Needed?: No  Information [facility] :: [facility]  Past Medical History:  Diagnosis Date  . Broken wrist   . Diabetes mellitus without complication ([place]   . Dysuria   . Hyperlipidemia   . Hypertension    Past Surgical History:  Procedure Laterality Date  . COLONOSCOPY  2005  . COLONOSCOPY WITH PROPOFOL N/A 10/16/2016   Procedure: COLONOSCOPY WITH PROPOFOL;  Surgeon: JRobert Bellow MD;  [facility];  Service: Endoscopy;  Laterality: N/A;  . STRABISMUS SURGERY Right    Family History  Problem Relation Age of Onset  . Cancer Mother   . Alcohol abuse Father   . Heart disease Maternal Uncle   . Rheumatic fever Maternal Uncle   . Diabetes Paternal Grandmother   . Emphysema Paternal Grandfather    Social History   Socioeconomic History  . Marital status: Married  Spouse name: Not on file  . Number of children: 3  . Years of education: Not on file  . Highest education level: Bachelor's degree (e.g., BA, AB, BS)  Occupational History  . Not on file  Social Needs  . Financial resource strain: Not hard at all  . Food insecurity    Worry: Never true    Inability: Never true  . Transportation needs    Medical: No    Non-medical: No  Tobacco Use  . Smoking status: Former Smoker    Types: Cigarettes  . Smokeless tobacco: Never Used  . Tobacco comment: quit in 1978   Substance and Sexual Activity  . Alcohol use: Yes    Alcohol/week: 4.0 standard drinks    Types: 4 Cans of beer per week  . Drug use: No  . Sexual activity: Not on file  Lifestyle  . Physical activity    Days per week: 3 days    Minutes per session: 70 min  . Stress: Not at all  Relationships  . Social Herbalist on phone: Patient refused    Gets together: Patient refused    Attends religious service: Patient refused    Active member of club or organization: Patient refused    Attends meetings of clubs or organizations: Patient refused    Relationship status: Patient refused  Other Topics Concern  . Not on file  Social History Narrative  . Not on file    Outpatient Encounter Medications as of 06/16/2019  Medication Sig  . ACCU-CHEK SMARTVIEW test strip CHECK  FASTING  BLOOD  SUGAR EVERY MORNING AS INSTRUCTED  . amLODipine (NORVASC) 5 MG tablet Take 1 tablet (5 mg total) by mouth at bedtime.  Marland Kitchen aspirin 81 MG tablet Take 1 tablet by mouth daily.  Marland Kitchen atorvastatin (LIPITOR) 20 MG tablet Take 1 tablet (20 mg total) by mouth daily.  . Blood Glucose Monitoring Suppl (ACCU-CHEK NANO SMARTVIEW) w/Device KIT Use glucometer (Accu-Chek Nano Glucometer) to test fasting glucose daily.  . Ferrous Sulfate (IRON SUPPLEMENT PO) Take 1 tablet by mouth daily.  . fluticasone (FLONASE) 50 MCG/ACT nasal spray Place 2 sprays into both nostrils daily. (Patient taking differently: Place 2 sprays into both nostrils daily. )  . glucose blood (TRUE METRIX BLOOD GLUCOSE TEST) test strip Use as instructed  . hyoscyamine (LEVSIN SL) 0.125 MG SL tablet Place 1 tablet (0.125 mg total) under the tongue every 6 (six) hours as needed.  . Lancets (ONETOUCH ULTRASOFT) lancets Use as instructed once a day for fasting glucose check.  . lidocaine (XYLOCAINE) 2 % solution Take 5 ml mixed with 5 ml of Mylanta 2-3 times a day as needed for acute acid indigestion.  . metFORMIN (GLUCOPHAGE) 1000 MG tablet Take 1  tablet (1,000 mg total) by mouth 2 (two) times daily with a meal.  . Misc Natural Products (OSTEO BI-FLEX ADV DOUBLE ST) CAPS Take 1 capsule by mouth daily.  . pantoprazole (PROTONIX) 40 MG tablet TAKE 1 TABLET(40 MG) BY MOUTH DAILY  . pioglitazone (ACTOS) 15 MG tablet Take 1 tablet (15 mg total) by mouth daily.  . Probiotic Product (PROBIOTIC FORMULA) CAPS Take by mouth daily.  . sucralfate (CARAFATE) 1 g tablet Take 1 tablet (1 g total) by mouth 4 (four) times daily -  with meals and at bedtime.  . empagliflozin (JARDIANCE) 10 MG TABS tablet Take 10 mg by mouth daily. (Patient not taking: Reported on 06/16/2019)   No facility-administered encounter medications on file  as of 06/16/2019.     Activities of Daily Living In your present state of health, do you have any difficulty performing the following activities: 06/16/2019 06/16/2018  Hearing? N N  Vision? N N  Difficulty concentrating or making decisions? N N  Walking or climbing stairs? N N  Dressing or bathing? N N  Doing errands, shopping? N N  Preparing Food and eating ? N -  Using the Toilet? N -  In the past six months, have you accidently leaked urine? N -  Do you have problems with loss of bowel control? N -  Managing your Medications? N -  Managing your Finances? N -  Housekeeping or managing your Housekeeping? N -  Some recent data might be hidden    Patient Care Team: Chrismon, Vickki Muff, PA as PCP - General (Physician Assistant) Cathi Roan, [facility] (Pharmacist) Pa, [place]   Assessment:   This is a routine wellness examination for Bekim.  Exercise Activities and Dietary recommendations Current Exercise Habits: Structured exercise class, Type of exercise: strength training/weights;stretching;treadmill;walking, Time (Minutes): > 60, Frequency (Times/Week): 3, Weekly Exercise (Minutes/Week): 0, Intensity: Mild, Exercise limited by: None identified  Goals      Patient Stated   . I have reached the  donut hole (pt-stated)     Current Barriers:  . financial  Pharmacist Clinical Goal(s): Over the next 14 days, Mr.. Turman will provide the necessary supplementary documents (proof of out of pocket prescription expenditure, proof of household income) needed for medication assistance applications to [facility] pharmacist.   Interventions: . [facility] pharmacist will apply for medication assistance program for Jardiance made by [place] o Updated 8/10:  patient is over income and does not qualify for Jardiance assistance;   o based on reported income, he will also be overincome for alternatives such as Trulicity and Ozempic; will coordinate with provider Simona Huh Chrismon regarding generic alternatives such as pioglitazone or glipizide XL; noted history of cancer in patient's mother, though unknown what type of cancer - Update 8/13: mother had breast cancer; patient interested in cardio benefits offered by Jardiance (none proven with glipizide or pioglitazone)  Patient Self Care Activities:  Marland Kitchen Gather necessary documents needed to apply for medication assistance  Please see past updates related to this goal by clicking on the "Past Updates" button in the selected goal        Other   . Reduce portion size     Recommend decreasing portion sizes and eating 3 meals a day and 2 healthy snacks in between.        Fall Risk Fall Risk  06/16/2019 06/16/2018 06/05/2018 04/29/2017 05/10/2016  Falls in the past year? 1 No No No No  Number falls in past yr: 0 - - - -  Injury with Fall? 0 - - - -  Follow up Falls prevention discussed - - - -   FALL RISK PREVENTION PERTAINING TO THE HOME:  Any stairs in or around the home? Yes  If so, are there any without handrails? No   Home free of loose throw rugs in walkways, pet beds, electrical cords, etc? Yes  Adequate lighting in your home to reduce risk of falls? Yes   ASSISTIVE DEVICES UTILIZED TO PREVENT FALLS:  Life alert? No  Use of a cane, walker or  w/c? No  Grab bars in the bathroom? No  Shower chair or bench in shower? No  Elevated toilet seat or a handicapped toilet? No  TIMED UP AND GO:  Was the test performed? No .    Depression Screen PHQ 2/9 Scores 06/16/2019 06/16/2018 06/05/2018 06/05/2018  PHQ - 2 Score 0 0 0 0  PHQ- 9 Score - 0 0 -    Cognitive Function: Declined today.         Immunization History  Administered Date(s) Administered  . Hepatitis A 02/02/2004  . Influenza, High Dose Seasonal PF 07/08/2014, 06/07/2018, 06/09/2019  . Influenza,inj,Quad PF,6+ Mos 07/23/2013  . Influenza-Unspecified 06/15/2015, 06/30/2017  . Pneumococcal Conjugate-13 05/10/2016  . Pneumococcal Polysaccharide-23 07/18/2017  . Tdap 08/22/2009    Qualifies for Shingles Vaccine? Yes . Due for Shingrix. Education has been provided regarding the importance of this vaccine. Pt has been advised to call insurance company to determine out of pocket expense. Advised may also receive vaccine at local pharmacy or Health Dept. Verbalized acceptance and understanding.  Tdap: Up to date  Flu Vaccine: Up to date  Pneumococcal Vaccine: Completed series  Screening Tests Health Maintenance  Topic Date Due  . OPHTHALMOLOGY EXAM  06/25/2016  . URINE MICROALBUMIN  06/17/2019  . TETANUS/TDAP  08/23/2019  . HEMOGLOBIN A1C  11/28/2019  . FOOT EXAM  05/27/2020  . COLONOSCOPY  10/16/2021  . INFLUENZA VACCINE  Completed  . Hepatitis C Screening  Completed  . PNA vac Low Risk Adult  Completed   Cancer Screenings:  Colorectal Screening: Completed 10/16/16. Repeat every 5 years.  Lung Cancer Screening: (Low Dose CT Chest recommended if Age 107-80 years, 30 pack-year currently smoking OR have quit w/in 15years.) does not qualify.   Additional Screening:  Hepatitis C Screening: Up to date  Dental Screening: Recommended annual dental exams for proper oral hygiene  Community Resource Referral:  CRR required this visit?  No        Plan:  I  have personally reviewed and addressed the Medicare Annual Wellness questionnaire and have noted the following in the patient's chart:  A. Medical and social history B. Use of alcohol, tobacco or illicit drugs  C. Current medications and supplements D. Functional ability and status E.  Nutritional status F.  Physical activity G. Advance directives H. List of other physicians I.  Hospitalizations, surgeries, and ER visits in previous 12 months J.  [place] such as hearing and vision if needed, cognitive and depression L. Referrals and appointments   In addition, I have reviewed and discussed with patient certain preventive protocols, quality metrics, and best practice recommendations. A written personalized care plan for preventive services as well as general preventive health recommendations were provided to patient.   Glendora Score, [place]  9/48/5462 Nurse Health Advisor  Nurse Notes: Pt needs a urine check at next in office visit. Pt to schedule an eye exam for this fall.   Reviewed Nurse Health Advisor note and plan. Agree with documentation and recommendations.

## 2019-06-16 ENCOUNTER — Ambulatory Visit (INDEPENDENT_AMBULATORY_CARE_PROVIDER_SITE_OTHER): Payer: Medicare HMO

## 2019-06-16 ENCOUNTER — Other Ambulatory Visit: Payer: Self-pay

## 2019-06-16 DIAGNOSIS — Z0000 Encounter for general adult medical examination without abnormal findings: Secondary | ICD-10-CM

## 2019-06-16 NOTE — Patient Instructions (Signed)
Steven Miranda , Thank you for taking time to come for your Medicare Wellness Visit. I appreciate your ongoing commitment to your health goals. Please review the following plan we discussed and let me know if I can assist you in the future.   Screening recommendations/referrals: Colonoscopy: Up to date, due 09/2021 Recommended yearly ophthalmology/optometry visit for glaucoma screening and checkup Recommended yearly dental visit for hygiene and checkup  Vaccinations: Influenza vaccine: Up to date Pneumococcal vaccine: Completed series Tdap vaccine: Up to date, due 08/2019 Shingles vaccine: Pt declines today.     Advanced directives: Advance directive discussed with you today. Even though you declined this today please call our office should you change your mind and we can give you the proper paperwork for you to fill out.  Conditions/risks identified: Fall risk prevention discussed with you today.  Next appointment: 08/20/19 @ 10:40 AM with Vernie Murders.   Preventive Care 71 Years and Older, Male Preventive care refers to lifestyle choices and visits with your health care provider that can promote health and wellness. What does preventive care include?  A yearly physical exam. This is also called an annual well check.  Dental exams once or twice a year.  Routine eye exams. Ask your health care provider how often you should have your eyes checked.  Personal lifestyle choices, including:  Daily care of your teeth and gums.  Regular physical activity.  Eating a healthy diet.  Avoiding tobacco and drug use.  Limiting alcohol use.  Practicing safe sex.  Taking low doses of aspirin every day.  Taking vitamin and mineral supplements as recommended by your health care provider. What happens during an annual well check? The services and screenings done by your health care provider during your annual well check will depend on your age, overall health, lifestyle risk factors, and  family history of disease. Counseling  Your health care provider may ask you questions about your:  Alcohol use.  Tobacco use.  Drug use.  Emotional well-being.  Home and relationship well-being.  Sexual activity.  Eating habits.  History of falls.  Memory and ability to understand (cognition).  Work and work Statistician. Screening  You may have the following tests or measurements:  Height, weight, and BMI.  Blood pressure.  Lipid and cholesterol levels. These may be checked every 5 years, or more frequently if you are over 54 years old.  Skin check.  Lung cancer screening. You may have this screening every year starting at age 71 if you have a 30-pack-year history of smoking and currently smoke or have quit within the past 15 years.  Fecal occult blood test (FOBT) of the stool. You may have this test every year starting at age 45.  Flexible sigmoidoscopy or colonoscopy. You may have a sigmoidoscopy every 5 years or a colonoscopy every 10 years starting at age 71.  Prostate cancer screening. Recommendations will vary depending on your family history and other risks.  Hepatitis C blood test.  Hepatitis B blood test.  Sexually transmitted disease (STD) testing.  Diabetes screening. This is done by checking your blood sugar (glucose) after you have not eaten for a while (fasting). You may have this done every 1-3 years.  Abdominal aortic aneurysm (AAA) screening. You may need this if you are a current or former smoker.  Osteoporosis. You may be screened starting at age 71 if you are at high risk. Talk with your health care provider about your test results, treatment options, and if necessary, the  need for more tests. Vaccines  Your health care provider may recommend certain vaccines, such as:  Influenza vaccine. This is recommended every year.  Tetanus, diphtheria, and acellular pertussis (Tdap, Td) vaccine. You may need a Td booster every 10 years.  Zoster  vaccine. You may need this after age 68.  Pneumococcal 13-valent conjugate (PCV13) vaccine. One dose is recommended after age 71.  Pneumococcal polysaccharide (PPSV23) vaccine. One dose is recommended after age 71. Talk to your health care provider about which screenings and vaccines you need and how often you need them. This information is not intended to replace advice given to you by your health care provider. Make sure you discuss any questions you have with your health care provider. Document Released: 10/13/2015 Document Revised: 06/05/2016 Document Reviewed: 07/18/2015 Elsevier Interactive Patient Education  2017 [place] Prevention in the Home Falls can cause injuries. They can happen to people of all ages. There are many things you can do to make your home safe and to help prevent falls. What can I do on the outside of my home?  Regularly fix the edges of walkways and driveways and fix any cracks.  Remove anything that might make you trip as you walk through a door, such as a raised step or threshold.  Trim any bushes or trees on the path to your home.  Use bright outdoor lighting.  Clear any walking paths of anything that might make someone trip, such as rocks or tools.  Regularly check to see if handrails are loose or broken. Make sure that both sides of any steps have handrails.  Any raised decks and porches should have guardrails on the edges.  Have any leaves, snow, or ice cleared regularly.  Use sand or salt on walking paths during winter.  Clean up any spills in your garage right away. This includes oil or grease spills. What can I do in the bathroom?  Use night lights.  Install grab bars by the toilet and in the tub and shower. Do not use towel bars as grab bars.  Use non-skid mats or decals in the tub or shower.  If you need to sit down in the shower, use a plastic, non-slip stool.  Keep the floor dry. Clean up any water that spills on the  floor as soon as it happens.  Remove soap buildup in the tub or shower regularly.  Attach bath mats securely with double-sided non-slip rug tape.  Do not have throw rugs and other things on the floor that can make you trip. What can I do in the bedroom?  Use night lights.  Make sure that you have a light by your bed that is easy to reach.  Do not use any sheets or blankets that are too big for your bed. They should not hang down onto the floor.  Have a firm chair that has side arms. You can use this for support while you get dressed.  Do not have throw rugs and other things on the floor that can make you trip. What can I do in the kitchen?  Clean up any spills right away.  Avoid walking on wet floors.  Keep items that you use a lot in easy-to-reach places.  If you need to reach something above you, use a strong step stool that has a grab bar.  Keep electrical cords out of the way.  Do not use floor polish or wax that makes floors slippery. If you must use wax,  use non-skid floor wax.  Do not have throw rugs and other things on the floor that can make you trip. What can I do with my stairs?  Do not leave any items on the stairs.  Make sure that there are handrails on both sides of the stairs and use them. Fix handrails that are broken or loose. Make sure that handrails are as long as the stairways.  Check any carpeting to make sure that it is firmly attached to the stairs. Fix any carpet that is loose or worn.  Avoid having throw rugs at the top or bottom of the stairs. If you do have throw rugs, attach them to the floor with carpet tape.  Make sure that you have a light switch at the top of the stairs and the bottom of the stairs. If you do not have them, ask someone to add them for you. What else can I do to help prevent falls?  Wear shoes that:  Do not have high heels.  Have rubber bottoms.  Are comfortable and fit you well.  Are closed at the toe. Do not wear  sandals.  If you use a stepladder:  Make sure that it is fully opened. Do not climb a closed stepladder.  Make sure that both sides of the stepladder are locked into place.  Ask someone to hold it for you, if possible.  Clearly mark and make sure that you can see:  Any grab bars or handrails.  First and last steps.  Where the edge of each step is.  Use tools that help you move around (mobility aids) if they are needed. These include:  Canes.  Walkers.  Scooters.  Crutches.  Turn on the lights when you go into a dark area. Replace any light bulbs as soon as they burn out.  Set up your furniture so you have a clear path. Avoid moving your furniture around.  If any of your floors are uneven, fix them.  If there are any pets around you, be aware of where they are.  Review your medicines with your doctor. Some medicines can make you feel dizzy. This can increase your chance of falling. Ask your doctor what other things that you can do to help prevent falls. This information is not intended to replace advice given to you by your health care provider. Make sure you discuss any questions you have with your health care provider. Document Released: 07/13/2009 Document Revised: 02/22/2016 Document Reviewed: 10/21/2014 Elsevier Interactive Patient Education  2017 [employer].

## 2019-07-20 DIAGNOSIS — E119 Type 2 diabetes mellitus without complications: Secondary | ICD-10-CM | POA: Diagnosis not present

## 2019-07-20 DIAGNOSIS — H524 Presbyopia: Secondary | ICD-10-CM | POA: Diagnosis not present

## 2019-07-28 ENCOUNTER — Encounter: Payer: Self-pay | Admitting: Family Medicine

## 2019-07-28 DIAGNOSIS — E0800 Diabetes mellitus due to underlying condition with hyperosmolarity without nonketotic hyperglycemic-hyperosmolar coma (NKHHC): Secondary | ICD-10-CM

## 2019-07-28 DIAGNOSIS — E782 Mixed hyperlipidemia: Secondary | ICD-10-CM

## 2019-07-28 DIAGNOSIS — R1013 Epigastric pain: Secondary | ICD-10-CM

## 2019-07-28 DIAGNOSIS — I10 Essential (primary) hypertension: Secondary | ICD-10-CM

## 2019-07-29 MED ORDER — ATORVASTATIN CALCIUM 20 MG PO TABS: 20.0000 mg | ORAL_TABLET | Freq: Every day | ORAL | 3 refills | Status: DC

## 2019-07-29 MED ORDER — PANTOPRAZOLE SODIUM 40 MG PO TBEC: DELAYED_RELEASE_TABLET | ORAL | 2 refills | Status: DC

## 2019-07-29 MED ORDER — AMLODIPINE BESYLATE 5 MG PO TABS: 5.0000 mg | ORAL_TABLET | Freq: Every day | ORAL | 3 refills | Status: DC

## 2019-07-29 MED ORDER — METFORMIN HCL 1000 MG PO TABS: 1000.0000 mg | ORAL_TABLET | Freq: Two times a day (BID) | ORAL | 3 refills | Status: DC

## 2019-07-30 ENCOUNTER — Other Ambulatory Visit: Payer: Self-pay

## 2019-07-30 ENCOUNTER — Emergency Department: Payer: Medicare HMO

## 2019-07-30 ENCOUNTER — Emergency Department
Admission: EM | Admit: 2019-07-30 | Discharge: 2019-07-30 | Disposition: A | Discharge status: Home / Self Care | Payer: Medicare HMO | Attending: Emergency Medicine | Admitting: Emergency Medicine

## 2019-07-30 DIAGNOSIS — I10 Essential (primary) hypertension: Secondary | ICD-10-CM | POA: Insufficient documentation

## 2019-07-30 DIAGNOSIS — Z79899 Other long term (current) drug therapy: Secondary | ICD-10-CM | POA: Diagnosis not present

## 2019-07-30 DIAGNOSIS — R079 Chest pain, unspecified: Secondary | ICD-10-CM | POA: Diagnosis not present

## 2019-07-30 DIAGNOSIS — E119 Type 2 diabetes mellitus without complications: Secondary | ICD-10-CM | POA: Insufficient documentation

## 2019-07-30 DIAGNOSIS — Z7982 Long term (current) use of aspirin: Secondary | ICD-10-CM | POA: Diagnosis not present

## 2019-07-30 DIAGNOSIS — Z7984 Long term (current) use of oral hypoglycemic drugs: Secondary | ICD-10-CM | POA: Insufficient documentation

## 2019-07-30 DIAGNOSIS — Z87891 Personal history of nicotine dependence: Secondary | ICD-10-CM | POA: Diagnosis not present

## 2019-07-30 DIAGNOSIS — R0789 Other chest pain: Secondary | ICD-10-CM | POA: Diagnosis not present

## 2019-07-30 LAB — BASIC METABOLIC PANEL
Anion gap: 11 (ref 5–15)
BUN: 15 mg/dL (ref 8–23)
CO2: 25 mmol/L (ref 22–32)
Calcium: 9.5 mg/dL (ref 8.9–10.3)
Chloride: 101 mmol/L (ref 98–111)
Creatinine, Ser: 1.14 mg/dL (ref 0.61–1.24)
GFR calc Af Amer: >60 mL/min (ref >60)
GFR calc non Af Amer: >60 mL/min (ref >60)
Glucose, Bld: 152 mg/dL — ABNORMAL HIGH (ref 70–99)
Potassium: 4.7 mmol/L (ref 3.5–5.1)
Sodium: 137 mmol/L (ref 135–145)

## 2019-07-30 LAB — LIPASE, BLOOD: Lipase: 22 U/L (ref 11–51)

## 2019-07-30 LAB — HEPATIC FUNCTION PANEL
ALT: 32 U/L (ref 0–44)
AST: 26 U/L (ref 15–41)
Albumin: 4.5 g/dL (ref 3.5–5.0)
Alkaline Phosphatase: 57 U/L (ref 38–126)
Bilirubin, Direct: 0.2 mg/dL (ref 0.0–0.2)
Indirect Bilirubin: 1.2 mg/dL — ABNORMAL HIGH (ref 0.3–0.9)
Total Bilirubin: 1.4 mg/dL — ABNORMAL HIGH (ref 0.3–1.2)
Total Protein: 7.6 g/dL (ref 6.5–8.1)

## 2019-07-30 LAB — CBC
HCT: 41.5 % (ref 39.0–52.0)
Hemoglobin: 14.6 g/dL (ref 13.0–17.0)
MCH: 31.5 pg (ref 26.0–34.0)
MCHC: 35.2 g/dL (ref 30.0–36.0)
MCV: 89.6 fL (ref 80.0–100.0)
Platelets: 251 10*3/uL (ref 150–400)
RBC: 4.63 MIL/uL (ref 4.22–5.81)
RDW: 12.9 % (ref 11.5–15.5)
WBC: 6.8 10*3/uL (ref 4.0–10.5)
nRBC: 0 % (ref 0.0–0.2)

## 2019-07-30 LAB — TROPONIN I (HIGH SENSITIVITY)
Troponin I (High Sensitivity): 3 ng/L (ref <18)
Troponin I (High Sensitivity): <2 ng/L (ref <18)

## 2019-07-30 MED ORDER — LIDOCAINE VISCOUS HCL 2 % MT SOLN
15.0000 mL | Freq: Once | OROMUCOSAL | Status: AC
  Administered 2019-07-30: 20:00:00 15 mL via ORAL
  Filled 2019-07-30: qty 15

## 2019-07-30 MED ORDER — GI COCKTAIL ~~LOC~~: 30.0000 mL | Freq: Two times a day (BID) | ORAL | 0 refills | Status: DC | PRN

## 2019-07-30 MED ORDER — ALUM & MAG HYDROXIDE-SIMETH 200-200-20 MG/5ML PO SUSP
30.0000 mL | Freq: Once | ORAL | Status: AC
  Administered 2019-07-30: 30 mL via ORAL
  Filled 2019-07-30: qty 30

## 2019-07-30 NOTE — Discharge Instructions (Addendum)
As we discussed please use your medication as prescribed, as written.  Return to the emergency department for any return of/worsening chest pain, trouble breathing, or any other symptom personally concerning to yourself.

## 2019-07-30 NOTE — ED Triage Notes (Signed)
Pt c/o chest tightness that started yesterday and has gotten worse today - pain radiates into right shoulder - denies SHOB, N/V, dizziness

## 2019-07-30 NOTE — ED Provider Notes (Signed)
[facility] Emergency Department Provider Note  Time seen: 8:05 PM  I have reviewed the triage vital signs and the nursing notes.   HISTORY  Chief Complaint Chest Pain   HPI Steven Miranda is a 73 y.o. male with a past medical history of diabetes, hypertension, hyperlipidemia presents to the emergency department for chest pain.  According to the patient over the past 2 to 3 days he has been experiencing a burning sensation in his chest.  Patient has a history of significant gastric reflux in the past.  Has taken his home sucralfate without relief so he came to the emergency department for evaluation.  Patient denies any nausea vomiting dyspnea recent fever cough.  Describes the pain as a mild to moderate burning sensation in the center of his chest that does radiate somewhat to the back.   Past Medical History:  Diagnosis Date  . Broken wrist   . Diabetes mellitus without complication ([place])   . Dysuria   . Hyperlipidemia   . Hypertension     Patient Active Problem List   Diagnosis Date Noted  . Carpal tunnel syndrome 02/12/2018  . Shoulder stiffness 11/25/2017  . Shoulder joint pain 06/06/2017  . Acid indigestion 02/02/2015  . Essential (primary) hypertension 02/02/2015  . Injury of tendon of rotator cuff 02/02/2015  . Diabetes mellitus, type 2 ([place]) 02/02/2015  . HLD (hyperlipidemia) 05/19/2007    Past Surgical History:  Procedure Laterality Date  . COLONOSCOPY  2005  . COLONOSCOPY WITH PROPOFOL N/A 10/16/2016   Procedure: COLONOSCOPY WITH PROPOFOL;  Surgeon: Robert Bellow, MD;  Location: [facility] ENDOSCOPY;  Service: Endoscopy;  Laterality: N/A;  . STRABISMUS SURGERY Right     Prior to Admission medications   Medication Sig Start Date End Date Taking? Authorizing Provider  ACCU-CHEK SMARTVIEW test strip CHECK  FASTING  BLOOD  SUGAR EVERY MORNING AS INSTRUCTED 10/29/16   Chrismon, Vickki Muff, PA  amLODipine (NORVASC) 5 MG tablet Take 1 tablet (5 mg  total) by mouth at bedtime. 07/29/19   Chrismon, Vickki Muff, PA  aspirin 81 MG tablet Take 1 tablet by mouth daily.    [provider]  atorvastatin (LIPITOR) 20 MG tablet Take 1 tablet (20 mg total) by mouth daily. 07/29/19   Chrismon, Vickki Muff, PA  Blood Glucose Monitoring Suppl (ACCU-CHEK NANO SMARTVIEW) w/Device KIT Use glucometer (Accu-Chek Nano Glucometer) to test fasting glucose daily. 12/31/16   Chrismon, Vickki Muff, PA  empagliflozin (JARDIANCE) 10 MG TABS tablet Take 10 mg by mouth daily. Patient not taking: Reported on 06/16/2019 10/08/18   Chrismon, Vickki Muff, PA  Ferrous Sulfate (IRON SUPPLEMENT PO) Take 1 tablet by mouth daily.    [provider]  fluticasone (FLONASE) 50 MCG/ACT nasal spray Place 2 sprays into both nostrils daily. Patient taking differently: Place 2 sprays into both nostrils daily.  07/09/17   Trinna Post, PA-C  glucose blood (TRUE METRIX BLOOD GLUCOSE TEST) test strip Use as instructed 06/14/19   Chrismon, Vickki Muff, PA  hyoscyamine (LEVSIN SL) 0.125 MG SL tablet Place 1 tablet (0.125 mg total) under the tongue every 6 (six) hours as needed. 01/29/19   Chrismon, Vickki Muff, PA  Lancets (ONETOUCH ULTRASOFT) lancets Use as instructed once a day for fasting glucose check. 03/30/15   Chrismon, Vickki Muff, PA  lidocaine (XYLOCAINE) 2 % solution Take 5 ml mixed with 5 ml of Mylanta 2-3 times a day as needed for acute acid indigestion. 12/12/17   Chrismon, Simona Huh  E, PA  metFORMIN (GLUCOPHAGE) 1000 MG tablet Take 1 tablet (1,000 mg total) by mouth 2 (two) times daily with a meal. 07/29/19   Chrismon, Vickki Muff, PA  Misc Natural Products (OSTEO BI-FLEX ADV DOUBLE ST) CAPS Take 1 capsule by mouth daily.    [provider]  pantoprazole (PROTONIX) 40 MG tablet TAKE 1 TABLET(40 MG) BY MOUTH DAILY 07/29/19   Chrismon, Simona Huh E, PA  pioglitazone (ACTOS) 15 MG tablet Take 1 tablet (15 mg total) by mouth daily. 05/28/19   Chrismon, Vickki Muff, PA  Probiotic Product  (PROBIOTIC FORMULA) CAPS Take by mouth daily.    [provider]  sucralfate (CARAFATE) 1 g tablet Take 1 tablet (1 g total) by mouth 4 (four) times daily -  with meals and at bedtime. 01/29/19   Chrismon, Vickki Muff, PA    No Known Allergies  Family History  Problem Relation Age of Onset  . Cancer Mother   . Alcohol abuse Father   . Heart disease Maternal Uncle   . Rheumatic fever Maternal Uncle   . Diabetes Paternal Grandmother   . Emphysema Paternal Grandfather     Social History Social History   Tobacco Use  . Smoking status: Former Smoker    Types: Cigarettes  . Smokeless tobacco: Never Used  . Tobacco comment: quit in 1978  Substance Use Topics  . Alcohol use: Yes    Alcohol/week: 4.0 standard drinks    Types: 4 Cans of beer per week  . Drug use: No    Review of Systems Constitutional: Negative for fever. Cardiovascular: Positive for burning chest pain radiating to the back. Respiratory: Negative for shortness of breath.  Negative for cough. Gastrointestinal: Negative for abdominal pain Musculoskeletal: Negative for musculoskeletal complaints Neurological: Negative for headache All other ROS negative  ____________________________________________   PHYSICAL EXAM:  VITAL SIGNS: ED Triage Vitals  Enc Vitals Group     BP 07/30/19 1804 (!) 169/109     Pulse Rate 07/30/19 1804 80     Resp 07/30/19 1804 16     Temp 07/30/19 1804 98 F (36.7 C)     Temp Source 07/30/19 1804 Oral     SpO2 07/30/19 1804 97 %     Weight 07/30/19 1810 250 lb (113.4 kg)     Height 07/30/19 1810 6' 2"  (1.88 m)     Head Circumference --      Peak Flow --      Pain Score 07/30/19 1810 6     Pain Loc --      Pain Edu? --      Excl. in [place]? --    Constitutional: Alert and oriented. Well appearing and in no distress. Eyes: Normal exam ENT      Head: Normocephalic and atraumatic.      Mouth/Throat: Mucous membranes are moist. Cardiovascular: Normal rate, regular rhythm.   Respiratory: Normal respiratory effort without tachypnea nor retractions. Breath sounds are clear Gastrointestinal: Soft and nontender. No distention.  Benign abdominal exam. Musculoskeletal: Nontender with normal range of motion in all extremities.  Neurologic:  Normal speech and language. No gross focal neurologic deficits Skin:  Skin is warm, dry and intact.  Psychiatric: Mood and affect are normal.   ____________________________________________    EKG  EKG viewed and interpreted by myself shows a normal sinus rhythm at 77 bpm with a narrow QRS, normal axis, normal intervals, no concerning ST changes.  ____________________________________________    RADIOLOGY  Chest x-ray is negative  ____________________________________________  INITIAL IMPRESSION / ASSESSMENT AND PLAN / ED COURSE  Pertinent labs & imaging results that were available during my care of the patient were reviewed by me and considered in my medical decision making (see chart for details).   Patient presents to the emergency department for mild burning chest pain.  States it feels very similar to past episodes of reflux however he took his sucralfate at home and it has not helped his symptoms so he became concerned and came to the emergency department.  Overall the patient appears well, normal physical exam.  Patient's work-up thus far is largely reassuring including a reassuring EKG, negative chest x-ray, largely normal lab work including a negative troponin.  We will repeat a troponin as a precaution.  We will dose a GI cocktail and I have added on a lipase.  Differential would include gastric reflux/heartburn, ACS, pancreatitis.  Patient states minimal alcohol use on the weekends but not a daily drinker.  Reassuringly patient has a benign abdominal exam.  Patient states the GI cocktail has helped considerably although the pain is not completely gone but is very mild at this time.  Patient's repeat troponin is  negative, lipase and hepatic function panel are largely reassuring as well.  We will discharge the patient with a prescription for GI cocktail to have the patient follow-up with his doctor.  I did discuss very strict chest pain return precautions.  Patient agreeable to plan of care.  BRYANT SAYE was evaluated in Emergency Department on 07/30/2019 for the symptoms described in the history of present illness. He was evaluated in the context of the global COVID-19 pandemic, which necessitated consideration that the patient might be at risk for infection with the SARS-CoV-2 virus that causes COVID-19. Institutional protocols and algorithms that pertain to the evaluation of patients at risk for COVID-19 are in a state of rapid change based on information released by regulatory bodies including the CDC and federal and state organizations. These policies and algorithms were followed during the patient's care in the ED.  ____________________________________________   FINAL CLINICAL IMPRESSION(S) / ED DIAGNOSES  Chest pain Reflux   Harvest Dark, MD 07/30/19 2113

## 2019-08-04 ENCOUNTER — Other Ambulatory Visit: Payer: Self-pay | Admitting: Family Medicine

## 2019-08-04 DIAGNOSIS — R1013 Epigastric pain: Secondary | ICD-10-CM

## 2019-08-10 ENCOUNTER — Other Ambulatory Visit: Payer: Self-pay | Admitting: Family Medicine

## 2019-08-10 ENCOUNTER — Encounter: Payer: Self-pay | Admitting: Family Medicine

## 2019-08-10 DIAGNOSIS — K30 Functional dyspepsia: Secondary | ICD-10-CM

## 2019-08-10 MED ORDER — LIDOCAINE VISCOUS HCL 2 % MT SOLN: OROMUCOSAL | 0 refills | Status: DC

## 2019-08-20 ENCOUNTER — Ambulatory Visit (INDEPENDENT_AMBULATORY_CARE_PROVIDER_SITE_OTHER): Payer: Medicare HMO | Admitting: Family Medicine

## 2019-08-20 ENCOUNTER — Other Ambulatory Visit: Payer: Self-pay

## 2019-08-20 ENCOUNTER — Encounter: Payer: Self-pay | Admitting: Family Medicine

## 2019-08-20 VITALS — BP 126/73 | HR 64 | Temp 97.3°F | Resp 18 | Ht 74.0 in | Wt 264.0 lb

## 2019-08-20 DIAGNOSIS — E119 Type 2 diabetes mellitus without complications: Secondary | ICD-10-CM | POA: Diagnosis not present

## 2019-08-20 DIAGNOSIS — I10 Essential (primary) hypertension: Secondary | ICD-10-CM

## 2019-08-20 DIAGNOSIS — E785 Hyperlipidemia, unspecified: Secondary | ICD-10-CM

## 2019-08-20 DIAGNOSIS — Z0000 Encounter for general adult medical examination without abnormal findings: Secondary | ICD-10-CM

## 2019-08-20 LAB — POCT GLYCOSYLATED HEMOGLOBIN (HGB A1C)
HbA1c POC (<> result, manual entry): 7.9 % (ref 4.0–5.6)
HbA1c, POC (controlled diabetic range): 7 % (ref 0.0–7.0)
HbA1c, POC (prediabetic range): 7.9 % — AB (ref 5.7–6.4)
Hemoglobin A1C: 7.9 % — AB (ref 4.0–5.6)

## 2019-08-20 LAB — POCT UA - MICROALBUMIN
Creatinine, POC: 1.14 mg/dL
Microalbumin Ur, POC: 20 mg/L

## 2019-08-20 NOTE — Progress Notes (Signed)
Patient: Steven Miranda, Male    DOB: 07/15/1946, 73 y.o.   MRN: [id number] Visit Date: 08/20/2019  Today's Provider: Vernie Murders, PA   Chief Complaint  Patient presents with  . Annual Exam   Subjective:     Patient has AWV with [facility] on 06/16/2019.   Complete Physical Steven Miranda is a 73 y.o. male. He feels well. He reports exercising yes/gym. He reports he is sleeping well.  -----------------------------------------------------------  Colonoscopy: 10/16/2016  Review of Systems  All other systems reviewed and are negative.   Social History   Socioeconomic History  . Marital status: Married    Spouse name: Not on file  . Number of children: 3  . Years of education: Not on file  . Highest education level: Bachelor's degree (e.g., BA, AB, BS)  Occupational History  . Not on file  Social Needs  . Financial resource strain: Not hard at all  . Food insecurity    Worry: Never true    Inability: Never true  . Transportation needs    Medical: No    Non-medical: No  Tobacco Use  . Smoking status: Former Smoker    Types: Cigarettes  . Smokeless tobacco: Never Used  . Tobacco comment: quit in 1978  Substance and Sexual Activity  . Alcohol use: Yes    Alcohol/week: 4.0 standard drinks    Types: 4 Cans of beer per week  . Drug use: No  . Sexual activity: Not on file  Lifestyle  . Physical activity    Days per week: 3 days    Minutes per session: 70 min  . Stress: Not at all  Relationships  . Social Herbalist on phone: Patient refused    Gets together: Patient refused    Attends religious service: Patient refused    Active member of club or organization: Patient refused    Attends meetings of clubs or organizations: Patient refused    Relationship status: Patient refused  . Intimate partner violence    Fear of current or ex partner: Patient refused    Emotionally abused: Patient refused    Physically abused: Patient refused    Forced  sexual activity: Patient refused  Other Topics Concern  . Not on file  Social History Narrative  . Not on file    Past Medical History:  Diagnosis Date  . Broken wrist   . Diabetes mellitus without complication ([place])   . Dysuria   . Hyperlipidemia   . Hypertension      Patient Active Problem List   Diagnosis Date Noted  . Carpal tunnel syndrome 02/12/2018  . Shoulder stiffness 11/25/2017  . Shoulder joint pain 06/06/2017  . Acid indigestion 02/02/2015  . Essential (primary) hypertension 02/02/2015  . Injury of tendon of rotator cuff 02/02/2015  . Diabetes mellitus, type 2 ([place]) 02/02/2015  . HLD (hyperlipidemia) 05/19/2007    Past Surgical History:  Procedure Laterality Date  . COLONOSCOPY  2005  . COLONOSCOPY WITH PROPOFOL N/A 10/16/2016   Procedure: COLONOSCOPY WITH PROPOFOL;  Surgeon: Robert Bellow, MD;  Location[facility] ENDOSCOPY;  Service: Endoscopy;  Laterality: N/A;  . STRABISMUS SURGERY Right     His family history includes Alcohol abuse in his father; Cancer in his mother; Diabetes in his paternal grandmother; Emphysema in his paternal grandfather; Heart disease in his maternal uncle; Rheumatic fever in his maternal uncle.   Current Outpatient Medications:  .  ACCU-CHEK SMARTVIEW test  strip, CHECK  FASTING  BLOOD  SUGAR EVERY MORNING AS INSTRUCTED, Disp: 100 each, Rfl: 4 .  amLODipine (NORVASC) 5 MG tablet, Take 1 tablet (5 mg total) by mouth at bedtime., Disp: 90 tablet, Rfl: 3 .  aspirin 81 MG tablet, Take 1 tablet by mouth daily., Disp: , Rfl:  .  atorvastatin (LIPITOR) 20 MG tablet, Take 1 tablet (20 mg total) by mouth daily., Disp: 90 tablet, Rfl: 3 .  Blood Glucose Monitoring Suppl (ACCU-CHEK NANO SMARTVIEW) w/Device KIT, Use glucometer (Accu-Chek Nano Glucometer) to test fasting glucose daily., Disp: 1 kit, Rfl: 0 .  Ferrous Sulfate (IRON SUPPLEMENT PO), Take 1 tablet by mouth daily., Disp: , Rfl:  .  fluticasone (FLONASE) 50 MCG/ACT nasal spray, Place 2  sprays into both nostrils daily. (Patient taking differently: Place 2 sprays into both nostrils daily. ), Disp: 16 g, Rfl: 6 .  glucose blood (TRUE METRIX BLOOD GLUCOSE TEST) test strip, Use as instructed, Disp: 100 each, Rfl: 12 .  hyoscyamine (LEVSIN SL) 0.125 MG SL tablet, Place 1 tablet (0.125 mg total) under the tongue every 6 (six) hours as needed., Disp: 30 tablet, Rfl: 1 .  Lancets (ONETOUCH ULTRASOFT) lancets, Use as instructed once a day for fasting glucose check., Disp: 100 each, Rfl: 3 .  lidocaine (XYLOCAINE) 2 % solution, Mix 5 ml with 5 ml of liquid antacid for acute indigetion pain and swallow. May repeat 2-3 times in a day if needed., Disp: 100 mL, Rfl: 0 .  metFORMIN (GLUCOPHAGE) 1000 MG tablet, Take 1 tablet (1,000 mg total) by mouth 2 (two) times daily with a meal., Disp: 180 tablet, Rfl: 3 .  Misc Natural Products (OSTEO BI-FLEX ADV DOUBLE ST) CAPS, Take 1 capsule by mouth daily., Disp: , Rfl:  .  pantoprazole (PROTONIX) 40 MG tablet, TAKE 1 TABLET EVERY DAY, Disp: 90 tablet, Rfl: 2 .  pioglitazone (ACTOS) 15 MG tablet, Take 1 tablet (15 mg total) by mouth daily., Disp: 90 tablet, Rfl: 03 .  Probiotic Product (PROBIOTIC FORMULA) CAPS, Take by mouth daily., Disp: , Rfl:  .  sucralfate (CARAFATE) 1 g tablet, Take 1 tablet (1 g total) by mouth 4 (four) times daily -  with meals and at bedtime., Disp: 40 tablet, Rfl: 1 .  empagliflozin (JARDIANCE) 10 MG TABS tablet, Take 10 mg by mouth daily. (Patient not taking: Reported on 06/16/2019), Disp: 90 tablet, Rfl: 3  Patient Care Team: Coleen Cardiff, Vickki Muff, PA as PCP - General (Physician Assistant) Cathi Roan, [place] (Pharmacist) Pa, [employer] [place] [employer]     Objective:    Vitals: BP 126/73 (BP Location: Right Arm, Patient Position: Sitting, Cuff Size: Large)   Pulse 64   Temp (!) 97.3 F (36.3 C) (Other (Comment))   Resp 18   Ht 6' 2"  (1.88 m)   Wt 264 lb (119.7 kg)   SpO2 97%   BMI 33.90 kg/m  Wt Readings from Last 3  Encounters:  08/20/19 264 lb (119.7 kg)  07/30/19 250 lb (113.4 kg)  05/28/19 251 lb (113.9 kg)   Physical Exam Constitutional:      Appearance: He is well-developed.  HENT:     Head: Normocephalic and atraumatic.     Right Ear: Tympanic membrane and external ear normal.     Left Ear: Tympanic membrane and external ear normal.     Nose: Nose normal.     Mouth/Throat:     Pharynx: Oropharynx is clear.  Eyes:     General:  Right eye: No discharge.     Conjunctiva/sclera: Conjunctivae normal.     Pupils: Pupils are equal, round, and reactive to light.     Comments: Congenital strabismus. Right eye kept partially closed.  Neck:     Musculoskeletal: Normal range of motion and neck supple.     Thyroid: No thyromegaly.     Trachea: No tracheal deviation.  Cardiovascular:     Rate and Rhythm: Normal rate and regular rhythm.     Heart sounds: Normal heart sounds. No murmur.  Pulmonary:     Effort: Pulmonary effort is normal. No respiratory distress.     Breath sounds: Normal breath sounds. No wheezing or rales.  Chest:     Chest wall: No tenderness.  Abdominal:     General: There is no distension.     Palpations: Abdomen is soft. There is no mass.     Tenderness: There is no abdominal tenderness. There is no guarding or rebound.  Genitourinary:    Penis: Normal.      Scrotum/Testes: Normal.     Rectum: Normal. Guaiac result negative.     Comments: Some prostate enlargement without nodules. Musculoskeletal: Normal range of motion.        General: No tenderness.  Lymphadenopathy:     Cervical: No cervical adenopathy.  Skin:    General: Skin is warm and dry.     Findings: No erythema or rash.  Neurological:     Mental Status: He is alert and oriented to person, place, and time.     Cranial Nerves: No cranial nerve deficit.     Motor: No abnormal muscle tone.     Coordination: Coordination normal.     Deep Tendon Reflexes: Reflexes are normal and symmetric. Reflexes  normal.  Psychiatric:        Behavior: Behavior normal.        Thought Content: Thought content normal.        Judgment: Judgment normal.     Activities of Daily Living In your present state of health, do you have any difficulty performing the following activities: 06/16/2019  Hearing? N  Vision? N  Difficulty concentrating or making decisions? N  Walking or climbing stairs? N  Dressing or bathing? N  Doing errands, shopping? N  Preparing Food and eating ? N  Using the Toilet? N  In the past six months, have you accidently leaked urine? N  Do you have problems with loss of bowel control? N  Managing your Medications? N  Managing your Finances? N  Housekeeping or managing your Housekeeping? N  Some recent data might be hidden    Fall Risk Assessment Fall Risk  06/16/2019 06/16/2018 06/05/2018 04/29/2017 05/10/2016  Falls in the past year? 1 No No No No  Number falls in past yr: 0 - - - -  Injury with Fall? 0 - - - -  Follow up Falls prevention discussed - - - -     Depression Screen PHQ 2/9 Scores 06/16/2019 06/16/2018 06/05/2018 06/05/2018  PHQ - 2 Score 0 0 0 0  PHQ- 9 Score - 0 0 -      Assessment & Plan:    Annual Physical Reviewed patient's Family Medical History Reviewed and updated list of patient's medical providers Assessment of cognitive impairment was done Assessed patient's functional ability Established a written schedule for health screening [place] Completed and Reviewed  Exercise Activities and Dietary recommendations Need to get more aerobic exercise for 30-40 minutes 3-4  times a week to help lose weight which will help get diabetes under better control.  Immunization History  Administered Date(s) Administered  . Hepatitis A 02/02/2004  . Influenza, High Dose Seasonal PF 07/08/2014, 06/07/2018, 06/09/2019  . Influenza,inj,Quad PF,6+ Mos 07/23/2013  . Influenza-Unspecified 06/15/2015, 06/30/2017  . Pneumococcal Conjugate-13  05/10/2016  . Pneumococcal Polysaccharide-23 07/18/2017  . Tdap 08/22/2009    Health Maintenance  Topic Date Due  . OPHTHALMOLOGY EXAM  06/25/2016  . URINE MICROALBUMIN  06/17/2019  . TETANUS/TDAP  08/23/2019  . HEMOGLOBIN A1C  11/28/2019  . FOOT EXAM  05/27/2020  . COLONOSCOPY  10/16/2021  . INFLUENZA VACCINE  Completed  . Hepatitis C Screening  Completed  . PNA vac Low Risk Adult  Completed     Discussed health benefits of physical activity, and encouraged him to engage in regular exercise appropriate for his age and condition.   1. Encounter for Medicare annual wellness exam General exam stable. Colonoscopy with one polyp on 10-16-16 by Dr. Bary Castilla (surgeon) with recommendation to repeat test in 2023. Immunizations up to date. Had eye exam at [facility] 1-2 months ago. Given anticipatory counseling.  2. Essential (primary) hypertension Well controlled BP on Amlodipine 5 mg hs. Need to lose weight and restrict salt intake.   3. Type 2 diabetes mellitus without complication, without long-term current use of insulin ([place]) Has gained 10-15 lbs and recognizes he has not been exercising enough to keep weight under control. Some looser stools on the Actos 15 mg qd. Still taking Metformin 1000 mg BID. Should check FBS daily and increase exercise. Urine microalbumin level is still 20 and Hgb A1C at 7.9% today. Recheck in 3-4 months to assess progress. BMP Latest Ref Rng & Units 07/30/2019 05/28/2019 07/03/2018  Glucose 70 - 99 mg/dL 152(H) 147(H) 157(H)  BUN 8 - 23 mg/dL 15 13 16   Creatinine 0.61 - 1.24 mg/dL 1.14 0.88 0.79  BUN/Creat Ratio 10 - 24 - 15 -  Sodium 135 - 145 mmol/L 137 139 137  Potassium 3.5 - 5.1 mmol/L 4.7 4.3 4.0  Chloride 98 - 111 mmol/L 101 101 104  CO2 22 - 32 mmol/L 25 22 25   Calcium 8.9 - 10.3 mg/dL 9.5 9.4 9.3     4. Hyperlipidemia, unspecified hyperlipidemia type Tolerating Lipitor 20 mg qd. Needs to work on weight loss again with a better exercise level.  Continue same dosage of Lipitor. Lipid Panel     Component Value Date/Time   CHOL 139 05/28/2019 1102   TRIG 71 05/28/2019 1102   HDL 56 05/28/2019 1102   CHOLHDL 2.5 05/28/2019 1102   LDLCALC 69 05/28/2019 1102   LABVLDL 14 05/28/2019 [place], PA  [employer]

## 2019-08-24 ENCOUNTER — Other Ambulatory Visit: Payer: Self-pay

## 2019-08-24 ENCOUNTER — Encounter: Payer: Self-pay | Admitting: Family Medicine

## 2019-08-24 DIAGNOSIS — R1013 Epigastric pain: Secondary | ICD-10-CM

## 2019-08-24 NOTE — Telephone Encounter (Signed)
Yes, please. He has severe recurrent dyspepsia causing chest pain.

## 2019-08-31 ENCOUNTER — Encounter: Payer: Self-pay | Admitting: *Deleted

## 2019-09-23 ENCOUNTER — Ambulatory Visit: Payer: Medicare HMO | Attending: Internal Medicine

## 2019-09-23 DIAGNOSIS — Z20822 Contact with and (suspected) exposure to covid-19: Secondary | ICD-10-CM

## 2019-09-23 DIAGNOSIS — Z20828 Contact with and (suspected) exposure to other viral communicable diseases: Secondary | ICD-10-CM | POA: Diagnosis not present

## 2019-09-24 LAB — NOVEL CORONAVIRUS, NAA: SARS-CoV-2, NAA: NOT DETECTED

## 2019-10-04 DIAGNOSIS — Z0100 Encounter for examination of eyes and vision without abnormal findings: Secondary | ICD-10-CM | POA: Diagnosis not present

## 2019-11-04 ENCOUNTER — Other Ambulatory Visit: Payer: Self-pay

## 2019-11-04 ENCOUNTER — Ambulatory Visit: Payer: Medicare HMO | Admitting: Gastroenterology

## 2019-11-04 ENCOUNTER — Encounter: Payer: Self-pay | Admitting: Gastroenterology

## 2019-11-04 VITALS — BP 141/82 | HR 71 | Temp 98.1°F | Ht 74.0 in | Wt 268.2 lb

## 2019-11-04 DIAGNOSIS — K219 Gastro-esophageal reflux disease without esophagitis: Secondary | ICD-10-CM

## 2019-11-04 DIAGNOSIS — R1013 Epigastric pain: Secondary | ICD-10-CM

## 2019-11-04 NOTE — Progress Notes (Signed)
Gastroenterology Consultation  Referring Provider:     Margo Common, PA Primary Care Physician:  Margo Common, PA Primary Gastroenterologist:  Dr. Allen Norris     Reason for Consultation:     Dyspepsia and chest pain        HPI:   Steven Miranda is a 74 y.o. y/o male referred for consultation & management of dyspepsia and chest pain by Dr. Natale Milch, Vickki Muff, PA.  This patient was sent to me by his PCP for evaluation of chest pain and dyspepsia.  The patient was sent for a colonoscopy to Dr. Bary Castilla who did his colonoscopy in 2018.  At that time the patient was found to have a tubular adenoma and was told that he needed a repeat colonoscopy, by his surgeon who did the colonoscopy, and 5 years.  The patient was in the ER on October 30 of 2020 for chest pain and was told it was likely due to reflux.  In November on the 20th of 2020 the patient was then seen by his primary care provider for the same issues and had reported some weight gain.  The patient was also noted to have high indirect bilirubin consistent with Gilbert's disease.  On 24 November the patient had called back to his PCP with worsening dyspepsia and chest pain and a referral was made to see me. The patient reports that he has these symptoms of chest pain once every 4 months and is readily helped with a GI cocktail.  The patient has his primary care provider to provide a GI cocktail and he has been taking it whenever he feels the symptoms coming on and he has abated any further attacks.  Past Medical History:  Diagnosis Date  . Broken wrist   . Diabetes mellitus without complication ([place])   . Dysuria   . Hyperlipidemia   . Hypertension     Past Surgical History:  Procedure Laterality Date  . COLONOSCOPY  2005  . COLONOSCOPY WITH PROPOFOL N/A 10/16/2016   Procedure: COLONOSCOPY WITH PROPOFOL;  Surgeon: Robert Bellow, MD;  Location: [facility] ENDOSCOPY;  Service: Endoscopy;  Laterality: N/A;  . STRABISMUS SURGERY Right       Prior to Admission medications   Medication Sig Start Date End Date Taking? Authorizing Provider  ACCU-CHEK SMARTVIEW test strip CHECK  FASTING  BLOOD  SUGAR EVERY MORNING AS INSTRUCTED 10/29/16   Chrismon, Vickki Muff, PA  amLODipine (NORVASC) 5 MG tablet Take 1 tablet (5 mg total) by mouth at bedtime. 07/29/19   Chrismon, Vickki Muff, PA  aspirin 81 MG tablet Take 1 tablet by mouth daily.    [provider]  atorvastatin (LIPITOR) 20 MG tablet Take 1 tablet (20 mg total) by mouth daily. 07/29/19   Chrismon, Vickki Muff, PA  Blood Glucose Monitoring Suppl (ACCU-CHEK NANO SMARTVIEW) w/Device KIT Use glucometer (Accu-Chek Nano Glucometer) to test fasting glucose daily. 12/31/16   Chrismon, Vickki Muff, PA  Ferrous Sulfate (IRON SUPPLEMENT PO) Take 1 tablet by mouth daily.    [provider]  fluticasone (FLONASE) 50 MCG/ACT nasal spray Place 2 sprays into both nostrils daily. Patient taking differently: Place 2 sprays into both nostrils daily.  07/09/17   Trinna Post, PA-C  glucose blood (TRUE METRIX BLOOD GLUCOSE TEST) test strip Use as instructed 06/14/19   Chrismon, Vickki Muff, PA  hyoscyamine (LEVSIN SL) 0.125 MG SL tablet Place 1 tablet (0.125 mg total) under the tongue every 6 (six) hours as needed.  01/29/19   Chrismon, Vickki Muff, PA  Lancets (ONETOUCH ULTRASOFT) lancets Use as instructed once a day for fasting glucose check. 03/30/15   Chrismon, Vickki Muff, PA  lidocaine (XYLOCAINE) 2 % solution Mix 5 ml with 5 ml of liquid antacid for acute indigetion pain and swallow. May repeat 2-3 times in a day if needed. 08/10/19   Chrismon, Vickki Muff, PA  metFORMIN (GLUCOPHAGE) 1000 MG tablet Take 1 tablet (1,000 mg total) by mouth 2 (two) times daily with a meal. 07/29/19   Chrismon, Vickki Muff, PA  Misc Natural Products (OSTEO BI-FLEX ADV DOUBLE ST) CAPS Take 1 capsule by mouth daily.    [provider]  pantoprazole (PROTONIX) 40 MG tablet TAKE 1 TABLET EVERY DAY 08/05/19   Chrismon, Simona Huh  E, PA  pioglitazone (ACTOS) 15 MG tablet Take 1 tablet (15 mg total) by mouth daily. 05/28/19   Chrismon, Vickki Muff, PA  Probiotic Product (PROBIOTIC FORMULA) CAPS Take by mouth daily.    [provider]  sucralfate (CARAFATE) 1 g tablet Take 1 tablet (1 g total) by mouth 4 (four) times daily -  with meals and at bedtime. 01/29/19   Chrismon, Vickki Muff, PA    Family History  Problem Relation Age of Onset  . Cancer Mother   . Alcohol abuse Father   . Heart disease Maternal Uncle   . Rheumatic fever Maternal Uncle   . Diabetes Paternal Grandmother   . Emphysema Paternal Grandfather      Social History   Tobacco Use  . Smoking status: Former Smoker    Types: Cigarettes  . Smokeless tobacco: Never Used  . Tobacco comment: quit in 1978  Substance Use Topics  . Alcohol use: Yes    Alcohol/week: 4.0 standard drinks    Types: 4 Cans of beer per week  . Drug use: No    Allergies as of 11/04/2019  . (No Known Allergies)    Review of Systems:    All systems reviewed and negative except where noted in HPI.   Physical Exam:  There were no vitals taken for this visit. No LMP for male patient. General:   Alert,  Well-developed, well-nourished, pleasant and cooperative in NAD Head:  Normocephalic and atraumatic. Eyes:  Sclera clear, no icterus.   Conjunctiva pink. Ears:  Normal auditory acuity. Neck:  Supple; no masses or thyromegaly. Lungs:  Respirations even and unlabored.  Clear throughout to auscultation.   No wheezes, crackles, or rhonchi. No acute distress. Heart:  Regular rate and rhythm; no murmurs, clicks, rubs, or gallops. Abdomen:  Normal bowel sounds.  No bruits.  Soft, non-tender and non-distended without masses, hepatosplenomegaly or hernias noted.  No guarding or rebound tenderness.  Negative Carnett sign.   Rectal:  Deferred.  Pulses:  Normal pulses noted. Extremities:  No clubbing or edema.  No cyanosis. Neurologic:  Alert and oriented x3;  grossly normal  neurologically. Skin:  Intact without significant lesions or rashes.  No jaundice. Lymph Nodes:  No significant cervical adenopathy. Psych:  Alert and cooperative. Normal mood and affect.  Imaging Studies: No results found.  Assessment and Plan:   KAUSHIK MAUL is a 74 y.o. y/o male who comes in today with intermittent bouts of chest pain attributed to reflux.  The patient has had decreased symptoms when he takes a GI cocktail in the ER.  He now keeps Maalox at home and will take that when he feels that the attack is coming on.  The patient has  not had an upper endoscopy to look for Barrett's or any signs or symptoms of chronic heartburn.  The patient is not on a PPI at this time.  The patient will be set up for an EGD to make sure that there is no sequela of chronic reflux in need of a daily PPI.  The patient has been explained the plan and agrees with it.    Lucilla Lame, MD. Marval Regal    Note: This dictation was prepared with Dragon dictation along with smaller phrase technology. Any transcriptional errors that result from this process are unintentional.

## 2019-11-09 ENCOUNTER — Encounter: Payer: Self-pay | Admitting: Family Medicine

## 2019-11-11 ENCOUNTER — Other Ambulatory Visit: Payer: Self-pay | Admitting: Family Medicine

## 2019-11-12 ENCOUNTER — Other Ambulatory Visit: Payer: Self-pay | Admitting: Family Medicine

## 2019-11-12 DIAGNOSIS — E119 Type 2 diabetes mellitus without complications: Secondary | ICD-10-CM

## 2019-11-12 MED ORDER — EMPAGLIFLOZIN 25 MG PO TABS: 25.0000 mg | ORAL_TABLET | Freq: Every day | ORAL | 3 refills | Status: DC

## 2019-11-17 ENCOUNTER — Telehealth: Payer: Self-pay | Admitting: Gastroenterology

## 2019-11-17 NOTE — Telephone Encounter (Signed)
Patient called & l/m on v/m asking if it would be ok to have his covid test on 12-09-2019 after having his covid injection done on 12-08-19. Ginger said this would be fine. Patient was given this information & understood.

## 2019-12-06 ENCOUNTER — Other Ambulatory Visit: Payer: Self-pay

## 2019-12-06 ENCOUNTER — Encounter: Payer: Self-pay | Admitting: Gastroenterology

## 2019-12-09 ENCOUNTER — Other Ambulatory Visit
Admission: RE | Admit: 2019-12-09 | Discharge: 2019-12-09 | Disposition: A | Discharge status: Home / Self Care | Payer: Medicare HMO | Source: Ambulatory Visit | Attending: Gastroenterology | Admitting: Gastroenterology

## 2019-12-09 ENCOUNTER — Other Ambulatory Visit: Payer: Self-pay

## 2019-12-09 DIAGNOSIS — Z01812 Encounter for preprocedural laboratory examination: Secondary | ICD-10-CM | POA: Diagnosis not present

## 2019-12-09 DIAGNOSIS — Z20822 Contact with and (suspected) exposure to covid-19: Secondary | ICD-10-CM | POA: Diagnosis not present

## 2019-12-09 LAB — SARS CORONAVIRUS 2 (TAT 6-24 HRS): SARS Coronavirus 2: NEGATIVE

## 2019-12-09 NOTE — Discharge Instructions (Signed)
General Anesthesia, Adult, Care After This sheet gives you information about how to care for yourself after your procedure. Your health care provider may also give you more specific instructions. If you have problems or questions, contact your health care provider. What can I expect after the procedure? After the procedure, the following side effects are common:  Pain or discomfort at the IV site.  Nausea.  Vomiting.  Sore throat.  Trouble concentrating.  Feeling cold or chills.  Weak or tired.  Sleepiness and fatigue.  Soreness and body aches. These side effects can affect parts of the body that were not involved in surgery. Follow these instructions at home:  For at least 24 hours after the procedure:  Have a responsible adult stay with you. It is important to have someone help care for you until you are awake and alert.  Rest as needed.  Do not: ? Participate in activities in which you could fall or become injured. ? Drive. ? Use heavy machinery. ? Drink alcohol. ? Take sleeping pills or medicines that cause drowsiness. ? Make important decisions or sign legal documents. ? Take care of children on your own. Eating and drinking  Follow any instructions from your health care provider about eating or drinking restrictions.  When you feel hungry, start by eating small amounts of foods that are soft and easy to digest (bland), such as toast. Gradually return to your regular diet.  Drink enough fluid to keep your urine pale yellow.  If you vomit, rehydrate by drinking water, juice, or clear broth. General instructions  If you have sleep apnea, surgery and certain medicines can increase your risk for breathing problems. Follow instructions from your health care provider about wearing your sleep device: ? Anytime you are sleeping, including during daytime naps. ? While taking prescription pain medicines, sleeping medicines, or medicines that make you drowsy.  Return to  your normal activities as told by your health care provider. Ask your health care provider what activities are safe for you.  Take over-the-counter and prescription medicines only as told by your health care provider.  If you smoke, do not smoke without supervision.  Keep all follow-up visits as told by your health care provider. This is important. Contact a health care provider if:  You have nausea or vomiting that does not get better with medicine.  You cannot eat or drink without vomiting.  You have pain that does not get better with medicine.  You are unable to pass urine.  You develop a skin rash.  You have a fever.  You have redness around your IV site that gets worse. Get help right away if:  You have difficulty breathing.  You have chest pain.  You have blood in your urine or stool, or you vomit blood. Summary  After the procedure, it is common to have a sore throat or nausea. It is also common to feel tired.  Have a responsible adult stay with you for the first 24 hours after general anesthesia. It is important to have someone help care for you until you are awake and alert.  When you feel hungry, start by eating small amounts of foods that are soft and easy to digest (bland), such as toast. Gradually return to your regular diet.  Drink enough fluid to keep your urine pale yellow.  Return to your normal activities as told by your health care provider. Ask your health care provider what activities are safe for you. This information is not   intended to replace advice given to you by your health care provider. Make sure you discuss any questions you have with your health care provider. Document Revised: 09/19/2017 Document Reviewed: 05/02/2017 Elsevier Patient Education  2020 Elsevier Inc.  

## 2019-12-13 ENCOUNTER — Encounter: Payer: Self-pay | Admitting: Gastroenterology

## 2019-12-13 ENCOUNTER — Ambulatory Visit: Payer: Medicare HMO | Admitting: Anesthesiology

## 2019-12-13 ENCOUNTER — Ambulatory Visit
Admission: RE | Admit: 2019-12-13 | Discharge: 2019-12-13 | Disposition: A | Discharge status: Home / Self Care | Payer: Medicare HMO | Attending: Gastroenterology | Admitting: Gastroenterology

## 2019-12-13 ENCOUNTER — Encounter
Admission: RE | Disposition: A | Discharge status: Home / Self Care | Payer: Self-pay | Source: Home / Self Care | Attending: Gastroenterology

## 2019-12-13 ENCOUNTER — Other Ambulatory Visit: Payer: Self-pay

## 2019-12-13 DIAGNOSIS — Z7984 Long term (current) use of oral hypoglycemic drugs: Secondary | ICD-10-CM | POA: Diagnosis not present

## 2019-12-13 DIAGNOSIS — Z7982 Long term (current) use of aspirin: Secondary | ICD-10-CM | POA: Insufficient documentation

## 2019-12-13 DIAGNOSIS — K2100 Gastro-esophageal reflux disease with esophagitis, without bleeding: Secondary | ICD-10-CM | POA: Insufficient documentation

## 2019-12-13 DIAGNOSIS — K228 Other specified diseases of esophagus: Secondary | ICD-10-CM | POA: Diagnosis not present

## 2019-12-13 DIAGNOSIS — R1013 Epigastric pain: Secondary | ICD-10-CM | POA: Diagnosis not present

## 2019-12-13 DIAGNOSIS — E119 Type 2 diabetes mellitus without complications: Secondary | ICD-10-CM | POA: Diagnosis not present

## 2019-12-13 DIAGNOSIS — K219 Gastro-esophageal reflux disease without esophagitis: Secondary | ICD-10-CM | POA: Diagnosis not present

## 2019-12-13 DIAGNOSIS — K2289 Other specified disease of esophagus: Secondary | ICD-10-CM

## 2019-12-13 DIAGNOSIS — Z87891 Personal history of nicotine dependence: Secondary | ICD-10-CM | POA: Diagnosis not present

## 2019-12-13 DIAGNOSIS — K30 Functional dyspepsia: Secondary | ICD-10-CM | POA: Diagnosis not present

## 2019-12-13 DIAGNOSIS — Z79899 Other long term (current) drug therapy: Secondary | ICD-10-CM | POA: Insufficient documentation

## 2019-12-13 DIAGNOSIS — K2270 Barrett's esophagus without dysplasia: Secondary | ICD-10-CM | POA: Diagnosis not present

## 2019-12-13 DIAGNOSIS — E785 Hyperlipidemia, unspecified: Secondary | ICD-10-CM | POA: Diagnosis not present

## 2019-12-13 DIAGNOSIS — M189 Osteoarthritis of first carpometacarpal joint, unspecified: Secondary | ICD-10-CM | POA: Insufficient documentation

## 2019-12-13 DIAGNOSIS — I10 Essential (primary) hypertension: Secondary | ICD-10-CM | POA: Diagnosis not present

## 2019-12-13 HISTORY — PX: ESOPHAGOGASTRODUODENOSCOPY (EGD) WITH PROPOFOL: SHX5813

## 2019-12-13 HISTORY — DX: Presence of spectacles and contact lenses: Z97.3

## 2019-12-13 HISTORY — DX: Unspecified osteoarthritis, unspecified site: M19.90

## 2019-12-13 LAB — GLUCOSE, CAPILLARY: Glucose-Capillary: 142 mg/dL — ABNORMAL HIGH (ref 70–99)

## 2019-12-13 SURGERY — ESOPHAGOGASTRODUODENOSCOPY (EGD) WITH PROPOFOL
Anesthesia: General | Site: Mouth

## 2019-12-13 MED ORDER — ONDANSETRON HCL 4 MG/2ML IJ SOLN: 4.0000 mg | Freq: Once | INTRAMUSCULAR | Status: DC | PRN

## 2019-12-13 MED ORDER — PROPOFOL 10 MG/ML IV BOLUS
INTRAVENOUS | Status: DC | PRN
  Administered 2019-12-13: 40 mg via INTRAVENOUS
  Administered 2019-12-13: 140 mg via INTRAVENOUS
  Administered 2019-12-13: 40 mg via INTRAVENOUS

## 2019-12-13 MED ORDER — LACTATED RINGERS IV SOLN: INTRAVENOUS | Status: DC

## 2019-12-13 MED ORDER — LIDOCAINE HCL (CARDIAC) PF 100 MG/5ML IV SOSY
PREFILLED_SYRINGE | INTRAVENOUS | Status: DC | PRN
  Administered 2019-12-13: 30 mg via INTRAVENOUS

## 2019-12-13 MED ORDER — GLYCOPYRROLATE 0.2 MG/ML IJ SOLN
INTRAMUSCULAR | Status: DC | PRN
  Administered 2019-12-13: .1 mg via INTRAVENOUS

## 2019-12-13 SURGICAL SUPPLY — 7 items
BLOCK BITE 60FR ADLT L/F GRN (MISCELLANEOUS) ×3 IMPLANT
CANISTER SUCT 1200ML W/VALVE (MISCELLANEOUS) ×3 IMPLANT
FORCEPS BIOP RAD 4 LRG CAP 4 (CUTTING FORCEPS) ×3 IMPLANT
GOWN CVR UNV OPN BCK APRN NK (MISCELLANEOUS) ×2 IMPLANT
GOWN ISOL THUMB LOOP REG UNIV (MISCELLANEOUS) ×4
KIT ENDO PROCEDURE OLY (KITS) ×3 IMPLANT
WATER STERILE IRR 250ML POUR (IV SOLUTION) ×3 IMPLANT

## 2019-12-13 NOTE — Op Note (Signed)
[facility] Patient Name: Steven Miranda Procedure Date: 12/13/2019 8:09 AM MRN: [id number] Account #: [id number] Date of Birth: Feb 05, 1946 Admit Type: Outpatient Age: 74 Room: [facility] OR ROOM 01 Gender: Male Note Status: Finalized Procedure:             Upper GI endoscopy Indications:           Indigestion Providers:             Lucilla Lame MD, MD Referring MD:          Vickki Muff. Chrismon, MD (Referring MD) Medicines:             Propofol per Anesthesia Complications:         No immediate complications. Procedure:             Pre-Anesthesia Assessment:                        - Prior to the procedure, a History and Physical was                         performed, and patient medications and allergies were                         reviewed. The patient's tolerance of previous                         anesthesia was also reviewed. The risks and benefits                         of the procedure and the sedation options and risks                         were discussed with the patient. All questions were                         answered, and informed consent was obtained. Prior                         Anticoagulants: The patient has taken no previous                         anticoagulant or antiplatelet agents. ASA Grade                         Assessment: II - A patient with mild systemic disease.                         After reviewing the risks and benefits, the patient                         was deemed in satisfactory condition to undergo the                         procedure.                        After obtaining informed consent, the endoscope was  passed under direct vision. Throughout the procedure,                         the patient's blood pressure, pulse, and oxygen                         saturations were monitored continuously. The                         Endosonoscope was introduced through the mouth, and             advanced to the second part of duodenum. The upper GI                         endoscopy was accomplished without difficulty. The                         patient tolerated the procedure well. Findings:      One tongue of salmon-colored mucosa was present. The maximum       longitudinal extent of these esophageal mucosal changes was 3 cm in       length. Biopsies were taken with a cold forceps for histology.      The stomach was normal.      The examined duodenum was normal. Impression:            - Salmon-colored mucosa suspicious for long-segment                         Barrett's esophagus. Biopsied.                        - Normal stomach.                        - Normal examined duodenum. Recommendation:        - Discharge patient to home.                        - Resume previous diet.                        - Continue present medications.                        - Await pathology results. Procedure Code(s):     --- Professional ---                        [phone], Esophagogastroduodenoscopy, flexible,                         transoral; with biopsy, single or multiple Diagnosis Code(s):     --- Professional ---                        K30, Functional dyspepsia                        K22.8, Other specified diseases of esophagus CPT copyright 2019 American Medical Association. All rights reserved. The codes documented in this report are preliminary and upon coder review may  be revised to meet current compliance requirements. Lucilla Lame MD,  MD 12/13/2019 8:27:41 AM This report has been signed electronically. Number of Addenda: 0 Note Initiated On: 12/13/2019 8:09 AM Total Procedure Duration: 0 hours 2 minutes 43 seconds  Estimated Blood Loss:  Estimated blood loss: none.      [facility]

## 2019-12-13 NOTE — Anesthesia Postprocedure Evaluation (Signed)
Anesthesia Post Note  Patient: Steven Miranda  Procedure(s) Performed: ESOPHAGOGASTRODUODENOSCOPY (EGD) WITH BIOPSY (N/A Mouth)     Patient location during evaluation: PACU Anesthesia Type: General Level of consciousness: awake and alert Pain management: pain level controlled Vital Signs Assessment: post-procedure vital signs reviewed and stable Respiratory status: spontaneous breathing, nonlabored ventilation, respiratory function stable and patient connected to nasal cannula oxygen Cardiovascular status: blood pressure returned to baseline and stable Postop Assessment: no apparent nausea or vomiting Anesthetic complications: no    Adele Barthel Cormac Wint

## 2019-12-13 NOTE — Anesthesia Preprocedure Evaluation (Signed)
Anesthesia Evaluation  Patient identified by MRN, date of birth, ID band  History of Anesthesia Complications Negative for: history of anesthetic complications  Airway Mallampati: II  TM Distance: >3 FB Neck ROM: Full    Dental no notable dental hx.    Pulmonary former smoker,    Pulmonary exam normal        Cardiovascular hypertension, Normal cardiovascular exam     Neuro/Psych negative neurological ROS     GI/Hepatic negative GI ROS, Neg liver ROS,   Endo/Other  diabetes (glucose 142 this AM), Type 2BMI 33  Renal/GU negative Renal ROS     Musculoskeletal   Abdominal   Peds  Hematology   Anesthesia Other Findings   Reproductive/Obstetrics                             Anesthesia Physical Anesthesia Plan  ASA: II  Anesthesia Plan: General   Post-op Pain Management:    Induction: Intravenous  PONV Risk Score and Plan: 2 and Propofol infusion, TIVA and Treatment may vary due to age or medical condition  Airway Management Planned: Nasal Cannula and Natural Airway  Additional Equipment: None  Intra-op Plan:   Post-operative Plan:   Informed Consent: I have reviewed the patients History and Physical, chart, labs and discussed the procedure including the risks, benefits and alternatives for the proposed anesthesia with the patient or authorized representative who has indicated his/her understanding and acceptance.       Plan Discussed with: CRNA  Anesthesia Plan Comments:         Anesthesia Quick Evaluation

## 2019-12-13 NOTE — Transfer of Care (Signed)
Immediate Anesthesia Transfer of Care Note  Patient: MANNIX HARKINS  Procedure(s) Performed: ESOPHAGOGASTRODUODENOSCOPY (EGD) WITH BIOPSY (N/A Mouth)  Patient Location: PACU  Anesthesia Type: General  Level of Consciousness: awake, alert  and patient cooperative  Airway and Oxygen Therapy: Patient Spontanous Breathing and Patient connected to supplemental oxygen  Post-op Assessment: Post-op Vital signs reviewed, Patient's Cardiovascular Status Stable, Respiratory Function Stable, Patent Airway and No signs of Nausea or vomiting  Post-op Vital Signs: Reviewed and stable  Complications: No apparent anesthesia complications

## 2019-12-13 NOTE — H&P (Signed)
Lucilla Lame, MD [facility] [address], [place], [place] [address] Phone:[phone] Fax : [phone]  Primary Care Physician:  Margo Common, [place] Primary Gastroenterologist:  Dr. Allen Norris  Pre-Procedure History & Physical: HPI:  Steven Miranda is a 74 y.o. male is here for an endoscopy.   Past Medical History:  Diagnosis Date  . Arthritis    right thumb  . Broken wrist   . Diabetes mellitus without complication ([place])   . Dysuria   . Hyperlipidemia   . Hypertension   . Wears contact lenses     Past Surgical History:  Procedure Laterality Date  . COLONOSCOPY  2005  . COLONOSCOPY WITH PROPOFOL N/A 10/16/2016   Procedure: COLONOSCOPY WITH PROPOFOL;  Surgeon: Robert Bellow, MD;  Location: [facility] ENDOSCOPY;  Service: Endoscopy;  Laterality: N/A;  . STRABISMUS SURGERY Right     Prior to Admission medications   Medication Sig Start Date End Date Taking? Authorizing Provider  amLODipine (NORVASC) 5 MG tablet Take 1 tablet (5 mg total) by mouth at bedtime. 07/29/19  Yes Chrismon, Vickki Muff, PA  atorvastatin (LIPITOR) 20 MG tablet Take 1 tablet (20 mg total) by mouth daily. 07/29/19  Yes Chrismon, Vickki Muff, PA  empagliflozin (JARDIANCE) 25 MG TABS tablet Take 25 mg by mouth daily before breakfast. 11/12/19  Yes Chrismon, Vickki Muff, PA  Ferrous Sulfate (IRON SUPPLEMENT PO) Take 1 tablet by mouth daily.   Yes [provider]  metFORMIN (GLUCOPHAGE) 1000 MG tablet Take 1 tablet (1,000 mg total) by mouth 2 (two) times daily with a meal. 07/29/19  Yes Chrismon, Vickki Muff, PA  Misc Natural Products (OSTEO BI-FLEX ADV DOUBLE ST) CAPS Take 1 capsule by mouth daily.   Yes [provider]  pantoprazole (PROTONIX) 40 MG tablet TAKE 1 TABLET EVERY DAY 08/05/19  Yes Chrismon, Vickki Muff, PA  pioglitazone (ACTOS) 15 MG tablet Take 1 tablet (15 mg total) by mouth daily. 05/28/19  Yes Chrismon, Vickki Muff, PA  Probiotic Product (PROBIOTIC FORMULA) CAPS Take by mouth daily.   Yes  [provider]  ACCU-CHEK SMARTVIEW test strip CHECK  FASTING  BLOOD  SUGAR EVERY MORNING AS INSTRUCTED 10/29/16   Chrismon, Vickki Muff, PA  aspirin 81 MG tablet Take 1 tablet by mouth daily.    [provider]  Blood Glucose Monitoring Suppl (ACCU-CHEK NANO SMARTVIEW) w/Device KIT Use glucometer (Accu-Chek Nano Glucometer) to test fasting glucose daily. 12/31/16   Chrismon, Vickki Muff, PA  fluticasone (FLONASE) 50 MCG/ACT nasal spray Place 2 sprays into both nostrils daily. Patient not taking: Reported on 11/04/2019 07/09/17   Trinna Post, PA-C  glucose blood (TRUE METRIX BLOOD GLUCOSE TEST) test strip Use as instructed 06/14/19   Chrismon, Vickki Muff, PA  hyoscyamine (LEVSIN SL) 0.125 MG SL tablet Place 1 tablet (0.125 mg total) under the tongue every 6 (six) hours as needed. Patient not taking: Reported on 12/06/2019 01/29/19   Chrismon, Vickki Muff, PA  Lancets [facility] ULTRASOFT) lancets Use as instructed once a day for fasting glucose check. 03/30/15   Chrismon, Vickki Muff, PA  lidocaine (XYLOCAINE) 2 % solution Mix 5 ml with 5 ml of liquid antacid for acute indigetion pain and swallow. May repeat 2-3 times in a day if needed. Patient not taking: Reported on 12/06/2019 08/10/19   Chrismon, Vickki Muff, PA  sucralfate (CARAFATE) 1 g tablet Take 1 tablet (1 g total) by mouth 4 (four) times daily -  with meals and at bedtime. Patient not  taking: Reported on 12/06/2019 01/29/19   Chrismon, Vickki Muff, PA    Allergies as of 11/04/2019  . (No Known Allergies)    Family History  Problem Relation Age of Onset  . Cancer Mother   . Alcohol abuse Father   . Heart disease Maternal Uncle   . Rheumatic fever Maternal Uncle   . Diabetes Paternal Grandmother   . Emphysema Paternal Grandfather     Social History   Socioeconomic History  . Marital status: Married    Spouse name: Not on file  . Number of children: 3  . Years of education: Not on file  . Highest education level: Bachelor's degree  (e.g., BA, AB, BS)  Occupational History  . Not on file  Tobacco Use  . Smoking status: Former Smoker    Types: Cigarettes    Quit date: 1978    Years since quitting: 43.2  . Smokeless tobacco: Never Used  . Tobacco comment: quit in 1978  Substance and Sexual Activity  . Alcohol use: Yes    Alcohol/week: 6.0 standard drinks    Types: 6 Cans of beer per week  . Drug use: No  . Sexual activity: Not on file  Other Topics Concern  . Not on file  Social History Narrative  . Not on file   Social Determinants of Health   Financial Resource Strain:   . Difficulty of Paying Living Expenses:   Food Insecurity:   . Worried About Charity fundraiser in the Last Year:   . Arboriculturist in the Last Year:   Transportation Needs:   . Film/video editor (Medical):   Marland Kitchen Lack of Transportation (Non-Medical):   Physical Activity: Sufficiently Active  . Days of Exercise per Week: 3 days  . Minutes of Exercise per Session: 70 min  Stress:   . Feeling of Stress :   Social Connections: Unknown  . Frequency of Communication with Friends and Family: Patient refused  . Frequency of Social Gatherings with Friends and Family: Patient refused  . Attends Religious Services: Patient refused  . Active Member of Clubs or Organizations: Patient refused  . Attends Archivist Meetings: Patient refused  . Marital Status: Patient refused  Intimate Partner Violence: Unknown  . Fear of Current or Ex-Partner: Patient refused  . Emotionally Abused: Patient refused  . Physically Abused: Patient refused  . Sexually Abused: Patient refused    Review of Systems: See HPI, otherwise negative ROS  Physical Exam: Ht _0  (1.88 m)   Wt 118.8 kg   BMI 33.64 kg/m  General:   Alert,  pleasant and cooperative in NAD Head:  Normocephalic and atraumatic. Neck:  Supple; no masses or thyromegaly. Lungs:  Clear throughout to auscultation.    Heart:  Regular rate and rhythm. Abdomen:  Soft,  nontender and nondistended. Normal bowel sounds, without guarding, and without rebound.   Neurologic:  Alert and  oriented x4;  grossly normal neurologically.  Impression/Plan: Steven Miranda is here for an endoscopy to be performed for dyspepsia  Risks, benefits, limitations, and alternatives regarding  endoscopy have been reviewed with the patient.  Questions have been answered.  All parties agreeable.   Lucilla Lame, MD  12/13/2019, 8:10 AM

## 2019-12-13 NOTE — Anesthesia Procedure Notes (Signed)
Date/Time: 12/13/2019 8:18 AM Performed by: Cameron Ali, CRNA Pre-anesthesia Checklist: Patient identified, Emergency Drugs available, Suction available, Timeout performed and Patient being monitored Patient Re-evaluated:Patient Re-evaluated prior to induction Oxygen Delivery Method: Nasal cannula Placement Confirmation: positive ETCO2

## 2019-12-14 ENCOUNTER — Encounter: Payer: Self-pay | Admitting: *Deleted

## 2019-12-14 ENCOUNTER — Encounter: Payer: Self-pay | Admitting: Family Medicine

## 2019-12-14 LAB — SURGICAL PATHOLOGY

## 2019-12-16 ENCOUNTER — Telehealth: Payer: Self-pay

## 2019-12-16 NOTE — Telephone Encounter (Signed)
-----   Message from Lucilla Lame, MD sent at 12/14/2019  1:59 PM EDT ----- Please have the patient come in for a follow up.  Let the patient know that there is no sign of cancer but I would like to talk to her about the findings.

## 2019-12-16 NOTE — Telephone Encounter (Signed)
Pt returned my call and scheduled a follow up appt.

## 2019-12-16 NOTE — Telephone Encounter (Signed)
LVM for pt to return my call.

## 2019-12-23 ENCOUNTER — Ambulatory Visit: Payer: Medicare HMO | Admitting: Gastroenterology

## 2019-12-23 ENCOUNTER — Other Ambulatory Visit: Payer: Self-pay

## 2019-12-23 ENCOUNTER — Encounter: Payer: Self-pay | Admitting: Gastroenterology

## 2019-12-23 VITALS — BP 117/71 | HR 74 | Ht 74.0 in | Wt 257.2 lb

## 2019-12-23 DIAGNOSIS — K2270 Barrett's esophagus without dysplasia: Secondary | ICD-10-CM

## 2019-12-23 NOTE — Progress Notes (Signed)
Primary Care Physician: Margo Common, PA  Primary Gastroenterologist:  Dr. Lucilla Lame  Chief Complaint  Patient presents with  . Follow up EGD results    HPI: Steven Miranda is a 74 y.o. male here for follow-up after having EGD for indigestion.  The patient had salmon-colored mucosa in the esophagus that was biopsied and came back as Barrett's esophagus.  The patient has a history of having a colonoscopy in 2018 by Dr. Bary Castilla with a recommended repeat colonoscopy in 5 years.  The patient's esophageal biopsies did not show any sign of dysplasia.  Past Medical History:  Diagnosis Date  . Arthritis    right thumb  . Broken wrist   . Diabetes mellitus without complication ([place])   . Dysuria   . Hyperlipidemia   . Hypertension   . Wears contact lenses     Current Outpatient Medications  Medication Sig Dispense Refill  . ACCU-CHEK SMARTVIEW test strip CHECK  FASTING  BLOOD  SUGAR EVERY MORNING AS INSTRUCTED 100 each 4  . amLODipine (NORVASC) 5 MG tablet Take 1 tablet (5 mg total) by mouth at bedtime. 90 tablet 3  . aspirin 81 MG tablet Take 1 tablet by mouth daily.    Marland Kitchen atorvastatin (LIPITOR) 20 MG tablet Take 1 tablet (20 mg total) by mouth daily. 90 tablet 3  . Blood Glucose Monitoring Suppl (ACCU-CHEK NANO SMARTVIEW) w/Device KIT Use glucometer (Accu-Chek Nano Glucometer) to test fasting glucose daily. 1 kit 0  . empagliflozin (JARDIANCE) 25 MG TABS tablet Take 25 mg by mouth daily before breakfast. 90 tablet 3  . Ferrous Sulfate (IRON SUPPLEMENT PO) Take 1 tablet by mouth daily.    Marland Kitchen glucose blood (TRUE METRIX BLOOD GLUCOSE TEST) test strip Use as instructed 100 each 12  . Lancets (ONETOUCH ULTRASOFT) lancets Use as instructed once a day for fasting glucose check. 100 each 3  . metFORMIN (GLUCOPHAGE) 1000 MG tablet Take 1 tablet (1,000 mg total) by mouth 2 (two) times daily with a meal. 180 tablet 3  . Misc Natural Products (OSTEO BI-FLEX ADV DOUBLE ST) CAPS Take 1  capsule by mouth daily.    . pantoprazole (PROTONIX) 40 MG tablet TAKE 1 TABLET EVERY DAY 90 tablet 2  . pioglitazone (ACTOS) 15 MG tablet Take 1 tablet (15 mg total) by mouth daily. 90 tablet 03  . Probiotic Product (PROBIOTIC FORMULA) CAPS Take by mouth daily.    . fluticasone (FLONASE) 50 MCG/ACT nasal spray Place 2 sprays into both nostrils daily. (Patient not taking: Reported on 11/04/2019) 16 g 6  . hyoscyamine (LEVSIN SL) 0.125 MG SL tablet Place 1 tablet (0.125 mg total) under the tongue every 6 (six) hours as needed. (Patient not taking: Reported on 12/06/2019) 30 tablet 1  . lidocaine (XYLOCAINE) 2 % solution Mix 5 ml with 5 ml of liquid antacid for acute indigetion pain and swallow. May repeat 2-3 times in a day if needed. (Patient not taking: Reported on 12/06/2019) 100 mL 0  . sucralfate (CARAFATE) 1 g tablet Take 1 tablet (1 g total) by mouth 4 (four) times daily -  with meals and at bedtime. (Patient not taking: Reported on 12/06/2019) 40 tablet 1   No current facility-administered medications for this visit.    Allergies as of 12/23/2019  . (No Known Allergies)    ROS:  General: Negative for anorexia, weight loss, fever, chills, fatigue, weakness. ENT: Negative for hoarseness, difficulty swallowing , nasal congestion. CV: Negative for chest pain,  angina, palpitations, dyspnea on exertion, peripheral edema.  Respiratory: Negative for dyspnea at rest, dyspnea on exertion, cough, sputum, wheezing.  GI: See history of present illness. GU:  Negative for dysuria, hematuria, urinary incontinence, urinary frequency, nocturnal urination.  Endo: Negative for unusual weight change.    Physical Examination:   BP 117/71   Pulse 74   Ht _0  (1.88 m)   Wt 257 lb 3.2 oz (116.7 kg)   BMI 33.02 kg/m   General: Well-nourished, well-developed in no acute distress.  Eyes: No icterus. Conjunctivae pink. Extremities: No lower extremity edema. No clubbing or deformities. Neuro: Alert and  oriented x 3.  Grossly intact. Skin: Warm and dry, no jaundice.   Psych: Alert and cooperative, normal mood and affect.  Labs:    Imaging Studies: No results found.  Assessment and Plan:   Steven Miranda is a 74 y.o. y/o male who comes in with a recent upper endoscopy showing Barrett's esophagus.  The patient has been told to stay on his PPI which he states has been working.  There is no report of any dysphagia or acid breakthrough.  The patient will contact me if he needs any refills of his prescription.  He will also be set up for repeat EGD in 3 years.     Lucilla Lame, MD. Marval Regal    Note: This dictation was prepared with Dragon dictation along with smaller phrase technology. Any transcriptional errors that result from this process are unintentional.

## 2020-01-04 ENCOUNTER — Ambulatory Visit (INDEPENDENT_AMBULATORY_CARE_PROVIDER_SITE_OTHER): Payer: Medicare HMO | Admitting: Family Medicine

## 2020-01-04 ENCOUNTER — Other Ambulatory Visit: Payer: Self-pay

## 2020-01-04 ENCOUNTER — Encounter: Payer: Self-pay | Admitting: Family Medicine

## 2020-01-04 VITALS — BP 117/64 | HR 60 | Temp 97.1°F | Resp 16 | Wt 260.0 lb

## 2020-01-04 DIAGNOSIS — L723 Sebaceous cyst: Secondary | ICD-10-CM

## 2020-01-04 NOTE — Progress Notes (Signed)
Established patient visit      Patient: Steven Miranda   DOB: [birth year]   74 y.o. Male  MRN: [id number] Visit Date: 01/04/2020  Today's healthcare provider: Vernie Murders, PA  Subjective:    Chief Complaint  Patient presents with  . Mass    x several years   HPI Mass: Patient comes in for an evaluation of mass on the posterior neck. He states it has been there for several year and has recently become larger. No pain today and no drainage.   Past Medical History:  Diagnosis Date  . Arthritis    right thumb  . Broken wrist   . Diabetes mellitus without complication ([place])   . Dysuria   . Hyperlipidemia   . Hypertension   . Wears contact lenses    Past Surgical History:  Procedure Laterality Date  . COLONOSCOPY  2005  . COLONOSCOPY WITH PROPOFOL N/A 10/16/2016   Procedure: COLONOSCOPY WITH PROPOFOL;  Surgeon: Robert Bellow, MD;  Location: [facility] ENDOSCOPY;  Service: Endoscopy;  Laterality: N/A;  . ESOPHAGOGASTRODUODENOSCOPY (EGD) WITH PROPOFOL N/A 12/13/2019   Procedure: ESOPHAGOGASTRODUODENOSCOPY (EGD) WITH BIOPSY;  Surgeon: Lucilla Lame, MD;  Location: [facility];  Service: Endoscopy;  Laterality: N/A;  Diabetic - oral meds  . STRABISMUS SURGERY Right    No Known Allergies    Medications: Outpatient Medications Prior to Visit  Medication Sig  . ACCU-CHEK SMARTVIEW test strip CHECK  FASTING  BLOOD  SUGAR EVERY MORNING AS INSTRUCTED  . amLODipine (NORVASC) 5 MG tablet Take 1 tablet (5 mg total) by mouth at bedtime.  Marland Kitchen aspirin 81 MG tablet Take 1 tablet by mouth daily.  Marland Kitchen atorvastatin (LIPITOR) 20 MG tablet Take 1 tablet (20 mg total) by mouth daily.  . Blood Glucose Monitoring Suppl (ACCU-CHEK NANO SMARTVIEW) w/Device KIT Use glucometer (Accu-Chek Nano Glucometer) to test fasting glucose daily.  . empagliflozin (JARDIANCE) 25 MG TABS tablet Take 25 mg by mouth daily before breakfast.  . Ferrous Sulfate (IRON SUPPLEMENT PO) Take 1 tablet by mouth daily.  .  fluticasone (FLONASE) 50 MCG/ACT nasal spray Place 2 sprays into both nostrils daily.  Marland Kitchen glucose blood (TRUE METRIX BLOOD GLUCOSE TEST) test strip Use as instructed  . hyoscyamine (LEVSIN SL) 0.125 MG SL tablet Place 1 tablet (0.125 mg total) under the tongue every 6 (six) hours as needed.  . Lancets (ONETOUCH ULTRASOFT) lancets Use as instructed once a day for fasting glucose check.  . lidocaine (XYLOCAINE) 2 % solution Mix 5 ml with 5 ml of liquid antacid for acute indigetion pain and swallow. May repeat 2-3 times in a day if needed.  . metFORMIN (GLUCOPHAGE) 1000 MG tablet Take 1 tablet (1,000 mg total) by mouth 2 (two) times daily with a meal.  . Misc Natural Products (OSTEO BI-FLEX ADV DOUBLE ST) CAPS Take 1 capsule by mouth daily.  . pantoprazole (PROTONIX) 40 MG tablet TAKE 1 TABLET EVERY DAY  . pioglitazone (ACTOS) 15 MG tablet Take 1 tablet (15 mg total) by mouth daily.  . Probiotic Product (PROBIOTIC FORMULA) CAPS Take by mouth daily.  . sucralfate (CARAFATE) 1 g tablet Take 1 tablet (1 g total) by mouth 4 (four) times daily -  with meals and at bedtime.   No facility-administered medications prior to visit.    Review of Systems  Constitutional: Negative for appetite change, chills and fever.  Respiratory: Negative for chest tightness, shortness of breath and wheezing.   Cardiovascular: Negative for chest pain and  palpitations.  Gastrointestinal: Negative for abdominal pain, nausea and vomiting.  Musculoskeletal: Positive for neck pain.  Skin:       Mass on the back of neck        Objective:    BP 117/64 (BP Location: Right Arm, Patient Position: Sitting, Cuff Size: Large)   Pulse 60   Temp (!) 97.1 F (36.2 C) (Temporal)   Resp 16   Wt 260 lb (117.9 kg)   BMI 33.38 kg/m    Physical Exam Constitutional:      General: He is not in acute distress.    Appearance: He is well-developed.  HENT:     Head: Normocephalic and atraumatic.     Right Ear: Hearing normal.      Left Ear: Hearing normal.     Nose: Nose normal.  Eyes:     General: Lids are normal. No scleral icterus.       Right eye: No discharge.        Left eye: No discharge.     Conjunctiva/sclera: Conjunctivae normal.  Pulmonary:     Effort: Pulmonary effort is normal. No respiratory distress.  Musculoskeletal:        General: Normal range of motion.  Skin:    Findings: Lesion present. No rash.     Comments: Large 3 cm cystic lesion posterior neck at the base of the skull on the left. Not tender or red. No local lymphadenopathy or drainage.  Neurological:     Mental Status: He is alert and oriented to person, place, and time.  Psychiatric:        Speech: Speech normal.        Behavior: Behavior normal.        Thought Content: Thought content normal.        Assessment & Plan:    1. Sebaceous cyst Enlarging sebaceous cyst on the left posterior neck that has been present for years. CT scan and head on 02-07-2019 identified this sebaceous cyst. Cyst is approximately 3 cm in diameter and raised 1.5 cm. No pain, redness or drainage. No local lymphadenopathy or lymphangitis. Recommend surgical referral for excision. - Ambulatory referral to [facility], [place] [phone] (phone) [phone] (fax)  [place]

## 2020-01-04 NOTE — Patient Instructions (Signed)
Epidermal Cyst  An epidermal cyst is a sac made of skin tissue. The sac contains a substance called keratin. Keratin is a protein that is normally secreted through the hair follicles. When keratin becomes trapped in the top layer of skin (epidermis), it can form an epidermal cyst. Epidermal cysts can be found anywhere on your body. These cysts are usually harmless (benign), and they may not cause symptoms unless they become infected. What are the causes? This condition may be caused by:  A blocked hair follicle.  A hair that curls and re-enters the skin instead of growing straight out of the skin (ingrown hair).  A blocked pore.  Irritated skin.  An injury to the skin.  Certain conditions that are passed along from parent to child (inherited).  Human papillomavirus (HPV).  Long-term (chronic) sun damage to the skin. What increases the risk? The following factors may make you more likely to develop an epidermal cyst:  Having acne.  Being overweight.  Being 30-40 years old. What are the signs or symptoms? The only symptom of this condition may be a small, painless lump underneath the skin. When an epidermal cyst ruptures, it may become infected. Symptoms may include:  Redness.  Inflammation.  Tenderness.  Warmth.  Fever.  Keratin draining from the cyst. Keratin is grayish-white, bad-smelling substance.  Pus draining from the cyst. How is this diagnosed? This condition is diagnosed with a physical exam.  In some cases, you may have a sample of tissue (biopsy) taken from your cyst to be examined under a microscope or tested for bacteria.  You may be referred to a health care provider who specializes in skin care (dermatologist). How is this treated? In many cases, epidermal cysts go away on their own without treatment. If a cyst becomes infected, treatment may include:  Opening and draining the cyst, done by a health care provider. After draining, minor surgery to  remove the rest of the cyst may be done.  Antibiotic medicine.  Injections of medicines (steroids) that help to reduce inflammation.  Surgery to remove the cyst. Surgery may be done if the cyst: ? Becomes large. ? Bothers you. ? Has a chance of turning into cancer.  Do not try to open a cyst yourself. Follow these instructions at home:  Take over-the-counter and prescription medicines only as told by your health care provider.  If you were prescribed an antibiotic medicine, take it it as told by your health care provider. Do not stop using the antibiotic even if you start to feel better.  Keep the area around your cyst clean and dry.  Wear loose, dry clothing.  Avoid touching your cyst.  Check your cyst every day for signs of infection. Check for: ? Redness, swelling, or pain. ? Fluid or blood. ? Warmth. ? Pus or a bad smell.  Keep all follow-up visits as told by your health care provider. This is important. How is this prevented?  Wear clean, dry, clothing.  Avoid wearing tight clothing.  Keep your skin clean and dry. Take showers or baths every day. Contact a health care provider if:  Your cyst develops symptoms of infection.  Your condition is not improving or is getting worse.  You develop a cyst that looks different from other cysts you have had.  You have a fever. Get help right away if:  Redness spreads from the cyst into the surrounding area. Summary  An epidermal cyst is a sac made of skin tissue. These cysts are   usually harmless (benign), and they may not cause symptoms unless they become infected.  If a cyst becomes infected, treatment may include surgery to open and drain the cyst, or to remove it. Treatment may also include medicines by mouth or through an injection.  Take over-the-counter and prescription medicines only as told by your health care provider. If you were prescribed an antibiotic medicine, take it as told by your health care  provider. Do not stop using the antibiotic even if you start to feel better.  Contact a health care provider if your condition is not improving or is getting worse.  Keep all follow-up visits as told by your health care provider. This is important. This information is not intended to replace advice given to you by your health care provider. Make sure you discuss any questions you have with your health care provider. Document Revised: 01/07/2019 Document Reviewed: 03/30/2018 Elsevier Patient Education  2020 Elsevier Inc.  

## 2020-01-06 ENCOUNTER — Other Ambulatory Visit: Payer: Self-pay

## 2020-01-06 ENCOUNTER — Encounter: Payer: Self-pay | Admitting: General Surgery

## 2020-01-06 ENCOUNTER — Ambulatory Visit: Payer: Medicare HMO | Admitting: General Surgery

## 2020-01-06 DIAGNOSIS — L720 Epidermal cyst: Secondary | ICD-10-CM | POA: Diagnosis not present

## 2020-01-06 NOTE — Progress Notes (Signed)
Sebaceous Cyst Excision Procedure Note  Pre-operative Diagnosis: Epidermal inclusion cyst  Post-operative Diagnosis: same  Locations:posterior Neck  Indications: Enlarging and tender epidermal inclusion cyst  Anesthesia: Lidocaine 1% with epinephrine without added sodium bicarbonate  Procedure Details  History of allergy to iodine: no  Patient informed of the risks (including bleeding and infection) and benefits of the  procedure and Written informed consent obtained.  The lesion and surrounding area was given a sterile prep using chlorhexidine and draped in the usual sterile fashion. An incision was made over the cyst, which was dissected free of the surrounding tissue and removed.  The cyst was filled with typical sebaceous material.  The wound was closed with 3-0 Vicryl using simple interrupted stitches.  The skin was closed with a running subcuticular 4-0 Monocryl.  Dermabond and Steri-Strips were applied.  The specimen was not sent for pathologic examination. The patient tolerated the procedure well.  EBL: Less than 2 ml  Findings: 3 cm epidermal inclusion cyst  Condition: Stable  Complications: none.  Plan: 1. Instructed to keep the wound dry and covered for 24-48h and clean thereafter. 2. Warning signs of infection were reviewed.   3. Recommended that the patient use OTC analgesics as needed for pain.  4. Return for wound check in 2 weeks. Patient may cancel if he feels all is going well.

## 2020-01-06 NOTE — Progress Notes (Signed)
Patient ID: Steven Miranda, male   DOB: 12/02/1945, 74 y.o.   MRN: [id number]  Chief Complaint  Patient presents with  . New Patient (Initial Visit)    new pt ref Simona Huh Chrismon PA sebaceous cyst posterior neck    HPI Steven Miranda is a 74 y.o. male.   He is here today for evaluation of a sebaceous cyst on the back of his neck.  He says it has been present for 8 to 10 years.  It has been enlarging and has become a little bit tender to touch.  He saw his primary care provider, Jenna Luo, PA-C, who referred him to general surgery for treatment.  He states that the cyst is only tender with manipulation.  There is no radiation of the pain anywhere from the site.  He denies any prior history of infection.  The cyst has never become hot, red, or drained anything.  He has not had any prior similar cysts anywhere else on his body.  He does not take any blood thinners.   Past Medical History:  Diagnosis Date  . Arthritis    right thumb  . Broken wrist   . Diabetes mellitus without complication ([place])   . Dysuria   . Hyperlipidemia   . Hypertension   . Wears contact lenses     Past Surgical History:  Procedure Laterality Date  . COLONOSCOPY  2005  . COLONOSCOPY WITH PROPOFOL N/A 10/16/2016   Procedure: COLONOSCOPY WITH PROPOFOL;  Surgeon: Robert Bellow, MD;  Location: [facility] ENDOSCOPY;  Service: Endoscopy;  Laterality: N/A;  . ESOPHAGOGASTRODUODENOSCOPY (EGD) WITH PROPOFOL N/A 12/13/2019   Procedure: ESOPHAGOGASTRODUODENOSCOPY (EGD) WITH BIOPSY;  Surgeon: Lucilla Lame, MD;  Location: [facility];  Service: Endoscopy;  Laterality: N/A;  Diabetic - oral meds  . STRABISMUS SURGERY Right     Family History  Problem Relation Age of Onset  . Cancer Mother   . Alcohol abuse Father   . Heart disease Maternal Uncle   . Rheumatic fever Maternal Uncle   . Diabetes Paternal Grandmother   . Emphysema Paternal Grandfather     Social History Social History   Tobacco Use  .  Smoking status: Former Smoker    Types: Cigarettes    Quit date: 1978    Years since quitting: 43.2  . Smokeless tobacco: Never Used  . Tobacco comment: quit in 1978  Substance Use Topics  . Alcohol use: Yes    Alcohol/week: 6.0 standard drinks    Types: 6 Cans of beer per week  . Drug use: No    No Known Allergies  Current Outpatient Medications  Medication Sig Dispense Refill  . ACCU-CHEK SMARTVIEW test strip CHECK  FASTING  BLOOD  SUGAR EVERY MORNING AS INSTRUCTED 100 each 4  . amLODipine (NORVASC) 5 MG tablet Take 1 tablet (5 mg total) by mouth at bedtime. 90 tablet 3  . aspirin 81 MG tablet Take 1 tablet by mouth daily.    Marland Kitchen atorvastatin (LIPITOR) 20 MG tablet Take 1 tablet (20 mg total) by mouth daily. 90 tablet 3  . Blood Glucose Monitoring Suppl (ACCU-CHEK NANO SMARTVIEW) w/Device KIT Use glucometer (Accu-Chek Nano Glucometer) to test fasting glucose daily. 1 kit 0  . empagliflozin (JARDIANCE) 25 MG TABS tablet Take 25 mg by mouth daily before breakfast. 90 tablet 3  . Ferrous Sulfate (IRON SUPPLEMENT PO) Take 1 tablet by mouth daily.    . fluticasone (FLONASE) 50 MCG/ACT nasal spray Place 2 sprays into both  nostrils daily. 16 g 6  . glucose blood (TRUE METRIX BLOOD GLUCOSE TEST) test strip Use as instructed 100 each 12  . hyoscyamine (LEVSIN SL) 0.125 MG SL tablet Place 1 tablet (0.125 mg total) under the tongue every 6 (six) hours as needed. 30 tablet 1  . Lancets (ONETOUCH ULTRASOFT) lancets Use as instructed once a day for fasting glucose check. 100 each 3  . lidocaine (XYLOCAINE) 2 % solution Mix 5 ml with 5 ml of liquid antacid for acute indigetion pain and swallow. May repeat 2-3 times in a day if needed. 100 mL 0  . metFORMIN (GLUCOPHAGE) 1000 MG tablet Take 1 tablet (1,000 mg total) by mouth 2 (two) times daily with a meal. 180 tablet 3  . Misc Natural Products (OSTEO BI-FLEX ADV DOUBLE ST) CAPS Take 1 capsule by mouth daily.    . pantoprazole (PROTONIX) 40 MG tablet  TAKE 1 TABLET EVERY DAY 90 tablet 2  . pioglitazone (ACTOS) 15 MG tablet Take 1 tablet (15 mg total) by mouth daily. 90 tablet 03  . Probiotic Product (PROBIOTIC FORMULA) CAPS Take by mouth daily.    . sucralfate (CARAFATE) 1 g tablet Take 1 tablet (1 g total) by mouth 4 (four) times daily -  with meals and at bedtime. 40 tablet 1   No current facility-administered medications for this visit.    Review of Systems Review of Systems  All other systems reviewed and are negative.   Blood pressure 129/82, pulse 69, temperature (!) 95.2 F (35.1 C), temperature source Temporal, height 6' 2"  (1.88 m), weight 257 lb 3.2 oz (116.7 kg), SpO2 96 %. Body mass index is 33.02 kg/m.  Physical Exam Physical Exam Vitals reviewed.  Constitutional:      General: He is not in acute distress.    Appearance: Normal appearance. He is obese.  HENT:     Head: Normocephalic and atraumatic.     Nose:     Comments: Covered with a mask secondary to COVID-19 precautions    Mouth/Throat:     Comments: Covered with a mask secondary to COVID-19 precautions Eyes:     General: No scleral icterus.       Right eye: No discharge.        Left eye: No discharge.     Comments: Right eye ptosis, secondary to surgery for amblyopia  Neck:     Comments: There is approximately 3 cm, soft, well-circumscribed lesion at the occiput.  It is mobile and does appear to have a central pore, consistent with an epidermal inclusion cyst. Cardiovascular:     Rate and Rhythm: Normal rate and regular rhythm.     Pulses: Normal pulses.  Pulmonary:     Effort: Pulmonary effort is normal.     Breath sounds: Normal breath sounds.  Abdominal:     General: Bowel sounds are normal.     Palpations: Abdomen is soft.  Genitourinary:    Comments: Deferred Musculoskeletal:        General: No swelling or tenderness.     Cervical back: No rigidity.  Lymphadenopathy:     Cervical: No cervical adenopathy.  Skin:    General: Skin is warm  and dry.  Neurological:     General: No focal deficit present.     Mental Status: He is alert and oriented to person, place, and time.  Psychiatric:        Mood and Affect: Mood normal.        Behavior: Behavior normal.  Data Reviewed There are no relevant external data for review.  Assessment This is a 74 year old man with what appears to be a simple epidermal inclusion cyst on the back of his neck.  Has enlarged and is somewhat tender.  He would like to have it removed.  Plan We will remove the cyst in clinic today.  I discussed the risks of the procedure with him.  These include, but are not limited to, bleeding, infection, wound healing problems, recurrence, numbness, pain.  He agrees to accept these risks and we will proceed with cyst removal.  See separate procedure note for additional documentation.    Fredirick Maudlin 01/06/2020, 9:15 AM

## 2020-01-06 NOTE — Patient Instructions (Addendum)
Dr.Cannon discussed with patient that he may notice a fluid collection in the area where the cyst was removed, but in time it will heal.   Patient instructed he may resume to showering tomorrow, but refrain from submerging the area in water or soap.   Patient was advised to refrain from any heavy activity that may cause the wound to become saturated.   Patient advised he may use the ice pack (cold compress) to help prevent any inflammation or swelling in the area. Patient to take Tylenol or Ibuprofen to help with any pain or discomfort.   Patient will follow up in two weeks, but if patient notices area of concern is healing properly. He may cancel the appointment.

## 2020-01-20 ENCOUNTER — Ambulatory Visit: Payer: Medicare HMO | Admitting: General Surgery

## 2020-01-24 ENCOUNTER — Encounter: Payer: Self-pay | Admitting: Emergency Medicine

## 2020-01-24 ENCOUNTER — Other Ambulatory Visit: Payer: Self-pay

## 2020-01-24 DIAGNOSIS — Z5321 Procedure and treatment not carried out due to patient leaving prior to being seen by health care provider: Secondary | ICD-10-CM | POA: Diagnosis not present

## 2020-01-24 DIAGNOSIS — J029 Acute pharyngitis, unspecified: Secondary | ICD-10-CM | POA: Diagnosis not present

## 2020-01-24 NOTE — ED Triage Notes (Signed)
Patient ambulatory to triage with steady gait, without difficulty or distress noted; pt reports sore throat since yesterday with no accomp symptoms

## 2020-01-25 ENCOUNTER — Telehealth: Payer: Self-pay

## 2020-01-25 ENCOUNTER — Other Ambulatory Visit: Payer: Self-pay

## 2020-01-25 ENCOUNTER — Emergency Department
Admission: EM | Admit: 2020-01-25 | Discharge: 2020-01-25 | Disposition: A | Discharge status: Home / Self Care | Payer: Medicare HMO | Attending: Emergency Medicine | Admitting: Emergency Medicine

## 2020-01-25 DIAGNOSIS — R1013 Epigastric pain: Secondary | ICD-10-CM

## 2020-01-25 MED ORDER — PANTOPRAZOLE SODIUM 40 MG PO TBEC: 40.0000 mg | DELAYED_RELEASE_TABLET | Freq: Two times a day (BID) | ORAL | 3 refills | Status: DC

## 2020-01-25 NOTE — Telephone Encounter (Signed)
He can try BID and if it does not work then he will need a pH study to see if it is really reflux or not.

## 2020-01-25 NOTE — Telephone Encounter (Signed)
Pt called this morning stating his reflux is really bothering him. He is currently taking Pantoprazole 40mg  daily. Please advise.   I did see he has checked into the ER this morning for a sore throat.

## 2020-01-25 NOTE — Telephone Encounter (Signed)
Prescription for Pantoprazole 40mg  sent to [facility]. Pt advised to take BID. If no improvement in his reflux, pt will call me back.

## 2020-02-08 ENCOUNTER — Other Ambulatory Visit: Payer: Self-pay

## 2020-02-08 ENCOUNTER — Encounter: Payer: Self-pay | Admitting: Dermatology

## 2020-02-08 ENCOUNTER — Ambulatory Visit: Payer: Medicare HMO | Admitting: Dermatology

## 2020-02-08 DIAGNOSIS — L578 Other skin changes due to chronic exposure to nonionizing radiation: Secondary | ICD-10-CM

## 2020-02-08 DIAGNOSIS — L814 Other melanin hyperpigmentation: Secondary | ICD-10-CM

## 2020-02-08 DIAGNOSIS — L821 Other seborrheic keratosis: Secondary | ICD-10-CM | POA: Diagnosis not present

## 2020-02-08 DIAGNOSIS — L905 Scar conditions and fibrosis of skin: Secondary | ICD-10-CM

## 2020-02-08 DIAGNOSIS — D229 Melanocytic nevi, unspecified: Secondary | ICD-10-CM

## 2020-02-08 DIAGNOSIS — Z1283 Encounter for screening for malignant neoplasm of skin: Secondary | ICD-10-CM | POA: Diagnosis not present

## 2020-02-08 DIAGNOSIS — D225 Melanocytic nevi of trunk: Secondary | ICD-10-CM | POA: Diagnosis not present

## 2020-02-08 DIAGNOSIS — D1801 Hemangioma of skin and subcutaneous tissue: Secondary | ICD-10-CM | POA: Diagnosis not present

## 2020-02-08 DIAGNOSIS — L570 Actinic keratosis: Secondary | ICD-10-CM

## 2020-02-08 DIAGNOSIS — D2262 Melanocytic nevi of left upper limb, including shoulder: Secondary | ICD-10-CM | POA: Diagnosis not present

## 2020-02-08 DIAGNOSIS — L853 Xerosis cutis: Secondary | ICD-10-CM

## 2020-02-08 NOTE — Patient Instructions (Addendum)
Cryotherapy Aftercare  . Wash gently with soap and water everyday.   . Apply Vaseline and Band-Aid daily until healed.    Gentle Skin Care Guide  1. Bathe no more than once a day.  2. Avoid bathing in hot water  3. Use a mild soap like Dove, Vanicream, Cetaphil, CeraVe. Can use Lever 2000 or Cetaphil antibacterial soap  4. Use soap only where you need it. On most days, use it under your arms, between your legs, and on your feet. Let the water rinse other areas unless visibly dirty.  5. When you get out of the bath/shower, use a towel to gently blot your skin dry, don't rub it.  6. While your skin is still a little damp, apply a moisturizing cream such as Vanicream, CeraVe, Cetaphil, Eucerin, Sarna lotion or plain Vaseline Jelly. For hands apply Neutrogena Norwegian Hand Cream or Excipial Hand Cream.  7. Reapply moisturizer any time you start to itch or feel dry.  8. Sometimes using free and clear laundry detergents can be helpful. Fabric softener sheets should be avoided. Downy Free & Gentle liquid, or any liquid fabric softener that is free of dyes and perfumes, it acceptable to use  9. If your doctor has given you prescription creams you may apply moisturizers over them     

## 2020-02-08 NOTE — Progress Notes (Signed)
   Follow-Up Visit   Subjective  Steven Miranda is a 74 y.o. male who presents for the following: Annual Exam (UBSE). He has a history of precancers treated on his face in the past. No lesions that patient is concerned about today.   The following portions of the chart were reviewed this encounter and updated as appropriate:      Review of Systems:  No other skin or systemic complaints except as noted in HPI or Assessment and Plan.  Objective  Well appearing patient in no apparent distress; mood and affect are within normal limits.  All skin waist up examined.  Objective  L forehead x 1: Erythematous thin papules/macules with gritty scale.   Objective  Brown macules and papules.  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   Assessment & Plan   Skin cancer screening performed today.  Actinic Damage - diffuse scaly erythematous macules with underlying dyspigmentation - Recommend daily broad spectrum sunscreen SPF 30+ to sun-exposed areas, reapply every 2 hours as needed.  - Call for new or changing lesions. Seborrheic Keratoses - Stuck-on, waxy, tan-brown papules and plaques  - Discussed benign etiology and prognosis. - Observe - Call for any changes  Lentigines - Scattered tan macules - Discussed due to sun exposure - Benign, observe - Call for any changes Hemangiomas - Red papules - Discussed benign nature - Observe - Call for any changes  Xerosis - diffuse xerotic patches - recommend gentle, hydrating skin care - gentle skin care handout given     AK (actinic keratosis) L forehead x 1  Destruction of lesion - L forehead x 1  Destruction method: cryotherapy   Informed consent: discussed and consent obtained   Lesion destroyed using liquid nitrogen: Yes   Region frozen until ice ball extended beyond lesion: Yes   Outcome: patient  tolerated procedure well with no complications   Post-procedure details: wound care instructions given    Nevus (2) Left Upper Arm; Left Lower Back  Benign-appearing.  Observation.  Call clinic for new or changing moles.  Recommend daily use of broad spectrum spf 30+ sunscreen to sun-exposed areas.    Scar Neck - Posterior  From recent cyst excision done at a surgical center.  Return in about 1 year (around 02/07/2021) for UBSE.   IJamesetta Orleans, CMA, am acting as scribe for Brendolyn Patty, MD .  Documentation: I have reviewed the above documentation for accuracy and completeness, and I agree with the above.  Brendolyn Patty MD

## 2020-02-12 ENCOUNTER — Encounter: Payer: Self-pay | Admitting: Family Medicine

## 2020-02-16 ENCOUNTER — Encounter: Payer: Self-pay | Admitting: Family Medicine

## 2020-02-17 ENCOUNTER — Other Ambulatory Visit: Payer: Self-pay

## 2020-02-17 ENCOUNTER — Other Ambulatory Visit: Payer: Self-pay | Admitting: Family Medicine

## 2020-02-17 ENCOUNTER — Telehealth: Payer: Self-pay

## 2020-02-17 ENCOUNTER — Encounter: Payer: Self-pay | Admitting: Family Medicine

## 2020-02-17 DIAGNOSIS — R0789 Other chest pain: Secondary | ICD-10-CM

## 2020-02-17 DIAGNOSIS — J0100 Acute maxillary sinusitis, unspecified: Secondary | ICD-10-CM

## 2020-02-17 DIAGNOSIS — K30 Functional dyspepsia: Secondary | ICD-10-CM

## 2020-02-17 MED ORDER — AMOXICILLIN 875 MG PO TABS: 875.0000 mg | ORAL_TABLET | Freq: Two times a day (BID) | ORAL | 0 refills | Status: DC

## 2020-02-17 MED ORDER — SUCRALFATE 1 G PO TABS: 1.0000 g | ORAL_TABLET | Freq: Three times a day (TID) | ORAL | 3 refills | Status: DC

## 2020-02-17 MED ORDER — HYOSCYAMINE SULFATE 0.125 MG SL SUBL: 0.1250 mg | SUBLINGUAL_TABLET | Freq: Four times a day (QID) | SUBLINGUAL | 5 refills | Status: DC | PRN

## 2020-02-17 NOTE — Telephone Encounter (Signed)
Copied from [place] [id number]. Topic: General - Other >> Feb 17, 2020 11:49 AM Marya Landry D wrote: Reason for CRM: Patient ci to be scheduled for a same day sick appointment but there is no availability on schedule for today,patient doesn't want to see another provider,wants to know if he can be squeezed in today for a appt regarding sinus infection

## 2020-02-17 NOTE — Telephone Encounter (Signed)
Patient offered a virtual appointment for tomorrow. No available appointments left today.  He states he has meetings all day. He will try OTC sinus medication and see if that helps.

## 2020-02-17 NOTE — Telephone Encounter (Signed)
Can try to work him in this afternoon between 2-3pm if he is not having COVID symptoms with fever. If having COVID symptoms, should go to an Urgent Care Clinic that could test him for that infection.

## 2020-02-17 NOTE — Progress Notes (Signed)
Sinus pressure without fever that is persistent this week despite use of Flonase and antihistamine with decongestant (OTC). Some headache but no sore throat, cough or earache. Will treat by adding an antibiotic to this regimen and encouraged to drink extra fluids. Follow up at the office if no better in 7-10 days. Monitor for COVID symptoms.

## 2020-04-18 ENCOUNTER — Other Ambulatory Visit: Payer: Self-pay | Admitting: Family Medicine

## 2020-04-18 DIAGNOSIS — E119 Type 2 diabetes mellitus without complications: Secondary | ICD-10-CM

## 2020-04-18 DIAGNOSIS — R1013 Epigastric pain: Secondary | ICD-10-CM

## 2020-04-18 NOTE — Telephone Encounter (Signed)
Requested medication (s) are due for refill today: yes  Requested medication (s) are on the active medication list:yes  Last refill:  protonix 01/25/20 historic provider, ACTOS 05/28/19  #90  0 4refills  Future visit scheduled:yes scheduled Thursday with Royetta Crochet PA medication refills  Notes to clinic:  labs due     Requested Prescriptions  Pending Prescriptions Disp Refills   pantoprazole (PROTONIX) 40 MG tablet [Pharmacy Med Name: PANTOPRAZOLE SODIUM 40 MG Tablet Delayed Release] 90 tablet     Sig: TAKE 1 TABLET EVERY DAY      Gastroenterology: Proton Pump Inhibitors Passed - 04/18/2020  2:08 PM      Passed - Valid encounter within last 12 months    Recent Outpatient Visits           3 months ago Sebaceous cyst   [place], Vickki Muff, PA   8 months ago 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   [place], [place]   10 months ago Type 2 diabetes mellitus without complication, without long-term current use of insulin ([place])   [facility], [place], [place]   1 year ago Type 2 diabetes mellitus without complication, without long-term current use of insulin ([place])   [facility], [place], [place]   1 year ago Type 2 diabetes mellitus without complication, without long-term current use of insulin ([place])   [facility], Vickki Muff, [place]       Future Appointments             In 2 days [place], Adriana M, PA-C [employer] [employer], [facility]              pioglitazone (ACTOS) 15 MG tablet [employer] Med Name: PIOGLITAZONE HYDROCHLORIDE 15 MG Tablet] 90 tablet 03    Sig: TAKE [place]      Endocrinology:  Diabetes - Glitazones - pioglitazone Failed - 04/18/2020  2:08 PM      Failed - HBA1C is between 0 and 7.9 and within 180 days    Hemoglobin A1C  Date Value Ref Range Status  08/20/2019 7.9 (A) 4.0 - 5.6 % Final   HbA1c, POC (prediabetic range)   Date Value Ref Range Status  08/20/2019 7.9 (A) 5.7 - 6.4 % Corrected   HbA1c, POC (controlled diabetic range)  Date Value Ref Range Status  08/20/2019 7.0 0.0 - 7.0 % Corrected   HbA1c POC (<> result, manual entry)  Date Value Ref Range Status  08/20/2019 7.9 4.0 - 5.6 % Final          Failed - Valid encounter within last 6 months    Recent Outpatient Visits           3 months ago Sebaceous cyst   [place], Vickki Muff, PA   8 months ago 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   [facility], [place], [place]   10 months ago Type 2 diabetes mellitus without complication, without long-term current use of insulin ([place])   [facility], [place], [place]   1 year ago Type 2 diabetes mellitus without complication, without long-term current use of insulin ([place])   [facility], [place], [place]   1 year ago Type 2 diabetes mellitus without complication, without long-term current use of insulin [facility])   [facility], Vickki Muff, [place]       Future Appointments  In 2 days Trinna Post, PA-C [facility], [facility]

## 2020-04-19 NOTE — Progress Notes (Signed)
Established patient visit   Patient: Steven Miranda   DOB: 1945/10/13   74 y.o. Male  MRN: [id number] Visit Date: 04/20/2020  Today's healthcare provider: Trinna Post, PA-C   Chief Complaint  Patient presents with  . Diabetes  . Hyperlipidemia  . Hypertension   Subjective    HPI  Diabetes Mellitus Type II, Follow-up  Lab Results  Component Value Date   HGBA1C 6.7 (H) 04/20/2020   HGBA1C  04/20/2020     Comment:     total Hgb low    HGBA1C 7.9 (A) 08/20/2019   HGBA1C 7.9 08/20/2019   HGBA1C 7.9 (A) 08/20/2019   HGBA1C 7.0 08/20/2019   Wt Readings from Last 3 Encounters:  04/20/20 246 lb (111.6 kg)  01/24/20 250 lb (113.4 kg)  01/06/20 257 lb 3.2 oz (116.7 kg)   Last seen for diabetes 8 months ago.  Management since then includes increasing jardiance to 25 mg daily.  He reports good compliance with treatment. He is not having side effects.  Symptoms: No fatigue No foot ulcerations  No appetite changes No nausea  No paresthesia of the feet  No polydipsia  No polyuria No visual disturbances   No vomiting     Home blood sugar records: fasting range: 130s and trend: stable  Episodes of hypoglycemia? No    Current insulin regiment: none Most Recent Eye Exam: needs updating Current exercise: none Current diet habits: well balanced  Pertinent Labs: Lab Results  Component Value Date   CHOL 155 04/20/2020   HDL 64 04/20/2020   LDLCALC 76 04/20/2020   TRIG 76 04/20/2020   CHOLHDL 2.4 04/20/2020   Lab Results  Component Value Date   NA 141 04/20/2020   K 4.3 04/20/2020   CREATININE 1.08 04/20/2020   GFRNONAA 68 04/20/2020   GFRAA 79 04/20/2020   GLUCOSE 136 (H) 04/20/2020     Hypertension, follow-up  BP Readings from Last 3 Encounters:  04/20/20 125/74  01/24/20 (!) 154/90  01/06/20 129/82   Wt Readings from Last 3 Encounters:  04/20/20 246 lb (111.6 kg)  01/24/20 250 lb (113.4 kg)  01/06/20 257 lb 3.2 oz (116.7 kg)     He was last  seen for hypertension 8 months ago.  BP at that visit was 126/73. Management since that visit includes no changes.  He reports good compliance with treatment. He is not having side effects.  He is following a Regular diet. He is not exercising. He does not smoke.  Use of agents associated with hypertension: none.   Outside blood pressures are checked occasionally. Symptoms: No chest pain No chest pressure  No palpitations No syncope  No dyspnea No orthopnea  No paroxysmal nocturnal dyspnea No lower extremity edema   Pertinent labs: Lab Results  Component Value Date   CHOL 155 04/20/2020   HDL 64 04/20/2020   LDLCALC 76 04/20/2020   TRIG 76 04/20/2020   CHOLHDL 2.4 04/20/2020   Lab Results  Component Value Date   NA 141 04/20/2020   K 4.3 04/20/2020   CREATININE 1.08 04/20/2020   GFRNONAA 68 04/20/2020   GFRAA 79 04/20/2020   GLUCOSE 136 (H) 04/20/2020     The 10-year ASCVD risk score Mikey Bussing DC Jr., et al., 2013) is: 33%   Lipid/Cholesterol, Follow-up  Last lipid panel Other pertinent labs  Lab Results  Component Value Date   CHOL 155 04/20/2020   HDL 64 04/20/2020   LDLCALC 76 04/20/2020   TRIG  76 04/20/2020   CHOLHDL 2.4 04/20/2020   Lab Results  Component Value Date   ALT 23 04/20/2020   AST 19 04/20/2020   PLT 239 04/20/2020   TSH 2.830 05/28/2019     He was last seen for this 8 months ago.  Management since that visit includes no changes.  He reports good compliance with treatment. He is not having side effects.       Medications: Outpatient Medications Prior to Visit  Medication Sig  . ACCU-CHEK SMARTVIEW test strip CHECK  FASTING  BLOOD  SUGAR EVERY MORNING AS INSTRUCTED  . amLODipine (NORVASC) 5 MG tablet Take 1 tablet (5 mg total) by mouth at bedtime.  Marland Kitchen amoxicillin (AMOXIL) 875 MG tablet Take 1 tablet (875 mg total) by mouth 2 (two) times daily.  Marland Kitchen aspirin 81 MG tablet Take 1 tablet by mouth daily.  Marland Kitchen atorvastatin (LIPITOR) 20 MG tablet  Take 1 tablet (20 mg total) by mouth daily.  . Blood Glucose Monitoring Suppl (ACCU-CHEK NANO SMARTVIEW) w/Device KIT Use glucometer (Accu-Chek Nano Glucometer) to test fasting glucose daily.  . empagliflozin (JARDIANCE) 25 MG TABS tablet Take 25 mg by mouth daily before breakfast.  . Ferrous Sulfate (IRON SUPPLEMENT PO) Take 1 tablet by mouth daily.  . fluticasone (FLONASE) 50 MCG/ACT nasal spray Place 2 sprays into both nostrils daily.  Marland Kitchen glucose blood (TRUE METRIX BLOOD GLUCOSE TEST) test strip Use as instructed  . hyoscyamine (LEVSIN SL) 0.125 MG SL tablet Place 1 tablet (0.125 mg total) under the tongue every 6 (six) hours as needed.  . Lancets (ONETOUCH ULTRASOFT) lancets Use as instructed once a day for fasting glucose check.  . lidocaine (XYLOCAINE) 2 % solution Mix 5 ml with 5 ml of liquid antacid for acute indigetion pain and swallow. May repeat 2-3 times in a day if needed.  . metFORMIN (GLUCOPHAGE) 1000 MG tablet Take 1 tablet (1,000 mg total) by mouth 2 (two) times daily with a meal.  . Misc Natural Products (OSTEO BI-FLEX ADV DOUBLE ST) CAPS Take 1 capsule by mouth daily.  . pantoprazole (PROTONIX) 40 MG tablet TAKE 1 TABLET EVERY DAY  . pioglitazone (ACTOS) 15 MG tablet TAKE 1 TABLET EVERY DAY  . Probiotic Product (PROBIOTIC FORMULA) CAPS Take by mouth daily.  . sucralfate (CARAFATE) 1 g tablet Take 1 tablet (1 g total) by mouth 4 (four) times daily -  with meals and at bedtime.   No facility-administered medications prior to visit.    Review of Systems  Constitutional: Negative.   Respiratory: Negative.   Cardiovascular: Negative.   Endocrine: Negative.   Musculoskeletal: Negative.   Neurological: Negative.       Objective    BP 125/74   Pulse 60   Temp 98.5 F (36.9 C)   Wt 246 lb (111.6 kg)   BMI 31.58 kg/m    Physical Exam Constitutional:      Appearance: Normal appearance.  Cardiovascular:     Rate and Rhythm: Normal rate and regular rhythm.     Heart  sounds: Normal heart sounds.  Pulmonary:     Effort: Pulmonary effort is normal.     Breath sounds: Normal breath sounds.  Skin:    General: Skin is warm and dry.  Neurological:     Mental Status: He is alert and oriented to person, place, and time. Mental status is at baseline.  Psychiatric:        Mood and Affect: Mood normal.  Behavior: Behavior normal.       Results for orders placed or performed in visit on 04/20/20  CBC with Differential/Platelet  Result Value Ref Range   WBC 7.5 3.4 - 10.8 x10E3/uL   RBC 4.57 4.14 - 5.80 x10E6/uL   Hemoglobin 14.8 13.0 - 17.7 g/dL   Hematocrit 40.6 37.5 - 51.0 %   MCV 89 79 - 97 fL   MCH 32.4 26.6 - 33.0 pg   MCHC 36.5 (H) 31 - 35 g/dL   RDW 12.4 11.6 - 15.4 %   Platelets 239 150 - 450 x10E3/uL   Neutrophils 72 Not Estab. %   Lymphs 17 Not Estab. %   Monocytes 8 Not Estab. %   Eos 2 Not Estab. %   Basos 1 Not Estab. %   Neutrophils Absolute 5.5 1 - 7 x10E3/uL   Lymphocytes Absolute 1.3 0 - 3 x10E3/uL   Monocytes Absolute 0.6 0 - 0 x10E3/uL   EOS (ABSOLUTE) 0.2 0.0 - 0.4 x10E3/uL   Basophils Absolute 0.0 0 - 0 x10E3/uL   Immature Granulocytes 0 Not Estab. %   Immature Grans (Abs) 0.0 0.0 - 0.1 x10E3/uL   Hematology Comments: Note:   Comprehensive metabolic panel  Result Value Ref Range   Glucose 136 (H) 65 - 99 mg/dL   BUN 17 8 - 27 mg/dL   Creatinine, Ser 1.08 0.76 - 1.27 mg/dL   GFR calc non Af Amer 68 >59 mL/min/1.73   GFR calc Af Amer 79 >59 mL/min/1.73   BUN/Creatinine Ratio 16 10 - 24   Sodium 141 134 - 144 mmol/L   Potassium 4.3 3.5 - 5.2 mmol/L   Chloride 100 96 - 106 mmol/L   CO2 24 20 - 29 mmol/L   Calcium 9.3 8.6 - 10.2 mg/dL   Total Protein 7.4 6.0 - 8.5 g/dL   Albumin 4.8 (H) 3.7 - 4.7 g/dL   Globulin, Total 2.6 1.5 - 4.5 g/dL   Albumin/Globulin Ratio 1.8 1.2 - 2.2   Bilirubin Total 0.8 0.0 - 1.2 mg/dL   Alkaline Phosphatase 71 48 - 121 IU/L   AST 19 0 - 40 IU/L   ALT 23 0 - 44 IU/L  Hemoglobin A1c   Result Value Ref Range   Hgb A1c MFr Bld 6.7 (H) 4.8 - 5.6 %   Est. average glucose Bld gHb Est-mCnc 146 mg/dL  Lipid panel  Result Value Ref Range   Cholesterol, Total 155 100 - 199 mg/dL   Triglycerides 76 0 - 149 mg/dL   HDL 64 >39 mg/dL   VLDL Cholesterol Cal 15 5 - 40 mg/dL   LDL Chol Calc (NIH) 76 0 - 99 mg/dL   Chol/HDL Ratio 2.4 0.0 - 5.0 ratio  POCT glycosylated hemoglobin (Hb A1C)  Result Value Ref Range   Hemoglobin A1C     HbA1c POC (<> result, manual entry)     HbA1c, POC (prediabetic range)     HbA1c, POC (controlled diabetic range)      Assessment & Plan    1. Type 2 diabetes mellitus without complication, without long-term current use of insulin (HCC)  Well controlled with last A1c 6.7% Continue current medications UTD on vaccines, foot exam. Requesting eye exam from [facility]. On Statin Discussed diet and exercise F/u in 4 months  - POCT glycosylated hemoglobin (Hb A1C) - Hemoglobin A1c - Urine Microalbumin w/creat. ratio  2. Essential (primary) hypertension  Well controlled Continue current medications Recheck metabolic panel F/u in  months  -  CBC with Differential/Platelet - Comprehensive metabolic panel  3. Hyperlipidemia, unspecified hyperlipidemia type  Previously well controlled Continue statin Repeat FLP and CMP Goal LDL < 70  - Lipid panel    Return in about 3 months (around 07/21/2020).      ITrinna Post, PA-C, have reviewed all documentation for this visit. The documentation on 04/21/20 for the exam, diagnosis, procedures, and orders are all accurate and complete.    Paulene Floor  [facility] [phone] (phone) [phone] (fax)  [place]

## 2020-04-20 ENCOUNTER — Encounter: Payer: Self-pay | Admitting: Physician Assistant

## 2020-04-20 ENCOUNTER — Ambulatory Visit (INDEPENDENT_AMBULATORY_CARE_PROVIDER_SITE_OTHER): Payer: Medicare HMO | Admitting: Physician Assistant

## 2020-04-20 ENCOUNTER — Other Ambulatory Visit: Payer: Self-pay

## 2020-04-20 VITALS — BP 125/74 | HR 60 | Temp 98.5°F | Wt 246.0 lb

## 2020-04-20 DIAGNOSIS — E785 Hyperlipidemia, unspecified: Secondary | ICD-10-CM | POA: Diagnosis not present

## 2020-04-20 DIAGNOSIS — I10 Essential (primary) hypertension: Secondary | ICD-10-CM

## 2020-04-20 DIAGNOSIS — E119 Type 2 diabetes mellitus without complications: Secondary | ICD-10-CM | POA: Diagnosis not present

## 2020-04-20 LAB — POCT GLYCOSYLATED HEMOGLOBIN (HGB A1C)

## 2020-04-21 ENCOUNTER — Encounter: Payer: Self-pay | Admitting: Physician Assistant

## 2020-04-21 LAB — CBC WITH DIFFERENTIAL/PLATELET
Basophils Absolute: 0 10*3/uL (ref 0.0–0.2)
Basos: 1 %
EOS (ABSOLUTE): 0.2 10*3/uL (ref 0.0–0.4)
Eos: 2 %
Hematocrit: 40.6 % (ref 37.5–51.0)
Hemoglobin: 14.8 g/dL (ref 13.0–17.7)
Immature Grans (Abs): 0 10*3/uL (ref 0.0–0.1)
Immature Granulocytes: 0 %
Lymphocytes Absolute: 1.3 10*3/uL (ref 0.7–3.1)
Lymphs: 17 %
MCH: 32.4 pg (ref 26.6–33.0)
MCHC: 36.5 g/dL — ABNORMAL HIGH (ref 31.5–35.7)
MCV: 89 fL (ref 79–97)
Monocytes Absolute: 0.6 10*3/uL (ref 0.1–0.9)
Monocytes: 8 %
Neutrophils Absolute: 5.5 10*3/uL (ref 1.4–7.0)
Neutrophils: 72 %
Platelets: 239 10*3/uL (ref 150–450)
RBC: 4.57 x10E6/uL (ref 4.14–5.80)
RDW: 12.4 % (ref 11.6–15.4)
WBC: 7.5 10*3/uL (ref 3.4–10.8)

## 2020-04-21 LAB — COMPREHENSIVE METABOLIC PANEL
ALT: 23 IU/L (ref 0–44)
AST: 19 IU/L (ref 0–40)
Albumin/Globulin Ratio: 1.8 (ref 1.2–2.2)
Albumin: 4.8 g/dL — ABNORMAL HIGH (ref 3.7–4.7)
Alkaline Phosphatase: 71 IU/L (ref 48–121)
BUN/Creatinine Ratio: 16 (ref 10–24)
BUN: 17 mg/dL (ref 8–27)
Bilirubin Total: 0.8 mg/dL (ref 0.0–1.2)
CO2: 24 mmol/L (ref 20–29)
Calcium: 9.3 mg/dL (ref 8.6–10.2)
Chloride: 100 mmol/L (ref 96–106)
Creatinine, Ser: 1.08 mg/dL (ref 0.76–1.27)
GFR calc Af Amer: 79 mL/min/{1.73_m2} (ref >59)
GFR calc non Af Amer: 68 mL/min/{1.73_m2} (ref >59)
Globulin, Total: 2.6 g/dL (ref 1.5–4.5)
Glucose: 136 mg/dL — ABNORMAL HIGH (ref 65–99)
Potassium: 4.3 mmol/L (ref 3.5–5.2)
Sodium: 141 mmol/L (ref 134–144)
Total Protein: 7.4 g/dL (ref 6.0–8.5)

## 2020-04-21 LAB — HEMOGLOBIN A1C
Est. average glucose Bld gHb Est-mCnc: 146 mg/dL
Hgb A1c MFr Bld: 6.7 % — ABNORMAL HIGH (ref 4.8–5.6)

## 2020-04-21 LAB — LIPID PANEL
Chol/HDL Ratio: 2.4 ratio (ref 0.0–5.0)
Cholesterol, Total: 155 mg/dL (ref 100–199)
HDL: 64 mg/dL (ref >39)
LDL Chol Calc (NIH): 76 mg/dL (ref 0–99)
Triglycerides: 76 mg/dL (ref 0–149)
VLDL Cholesterol Cal: 15 mg/dL (ref 5–40)

## 2020-04-21 LAB — MICROALBUMIN / CREATININE URINE RATIO
Creatinine, Urine: 113.4 mg/dL
Microalb/Creat Ratio: 4 mg/g creat (ref 0–29)
Microalbumin, Urine: 4.5 ug/mL

## 2020-06-15 DIAGNOSIS — D485 Neoplasm of uncertain behavior of skin: Secondary | ICD-10-CM | POA: Diagnosis not present

## 2020-06-15 DIAGNOSIS — E119 Type 2 diabetes mellitus without complications: Secondary | ICD-10-CM | POA: Diagnosis not present

## 2020-06-15 DIAGNOSIS — B070 Plantar wart: Secondary | ICD-10-CM | POA: Diagnosis not present

## 2020-06-22 ENCOUNTER — Telehealth: Payer: Self-pay | Admitting: Family Medicine

## 2020-06-22 NOTE — Telephone Encounter (Signed)
Copied from [place] [id number]. Topic: Medicare AWV >> Jun 22, 2020  1:46 PM Cher Nakai R wrote: Reason for CRM: Left message for patient to call back and schedule Medicare Annual Wellness Visit (AWV) either virtually or in office.  Last AWV 06/16/2019  Please schedule at anytime with [facility] Advisor.  If any questions, please contact me at [phone]

## 2020-06-29 DIAGNOSIS — D485 Neoplasm of uncertain behavior of skin: Secondary | ICD-10-CM | POA: Diagnosis not present

## 2020-06-29 DIAGNOSIS — E119 Type 2 diabetes mellitus without complications: Secondary | ICD-10-CM | POA: Diagnosis not present

## 2020-07-11 ENCOUNTER — Other Ambulatory Visit: Payer: Self-pay | Admitting: Family Medicine

## 2020-07-11 DIAGNOSIS — E782 Mixed hyperlipidemia: Secondary | ICD-10-CM

## 2020-07-11 DIAGNOSIS — I10 Essential (primary) hypertension: Secondary | ICD-10-CM

## 2020-07-12 NOTE — Progress Notes (Addendum)
Subjective:   Steven Miranda is a 74 y.o. male who presents for Medicare Annual/Subsequent preventive examination.  I connected with Steven Miranda today by telephone and verified that I am speaking with the correct person using two identifiers. Location patient: home Location provider: work Persons participating in the virtual visit: patient, provider.   I discussed the limitations, risks, security and privacy concerns of performing an evaluation and management service by telephone and the availability of in person appointments. I also discussed with the patient that there may be a patient responsible charge related to this service. The patient expressed understanding and verbally consented to this telephonic visit.    Interactive audio and video telecommunications were attempted between this provider and patient, however failed, due to patient having technical difficulties OR patient did not have access to video capability.  We continued and completed visit with audio only.   Review of Systems    N/A  Cardiac Risk Factors include: advanced age (>47mn, >>31women);diabetes mellitus;dyslipidemia;hypertension;male gender     Objective:    There were no vitals filed for this visit. There is no height or weight on file to calculate BMI.  Advanced Directives 07/17/2020 01/24/2020 12/13/2019 07/30/2019 06/16/2019 02/07/2019 07/03/2018  Does Patient Have a Medical Advance Directive? _0  No No  Would patient like information on creating a medical advance directive? No - Patient declined No - Patient declined Yes (MAU/Ambulatory/Procedural Areas - Information given) - No - Patient declined No - Patient declined No - Patient declined    Current Medications (verified) Outpatient Encounter Medications as of 07/17/2020  Medication Sig   ACCU-CHEK SMARTVIEW test strip CHECK  FASTING  BLOOD  SUGAR EVERY MORNING AS INSTRUCTED   amLODipine (NORVASC) 5 MG tablet Take 1 tablet (5 mg total) by  mouth at bedtime.   atorvastatin (LIPITOR) 20 MG tablet Take 1 tablet (20 mg total) by mouth daily.   Blood Glucose Monitoring Suppl (ACCU-CHEK NANO SMARTVIEW) w/Device KIT Use glucometer (Accu-Chek Nano Glucometer) to test fasting glucose daily.   empagliflozin (JARDIANCE) 25 MG TABS tablet Take 25 mg by mouth daily before breakfast.   Ferrous Sulfate (IRON SUPPLEMENT PO) Take 1 tablet by mouth daily.   fluticasone (FLONASE) 50 MCG/ACT nasal spray Place 2 sprays into both nostrils daily.   glucose blood (TRUE METRIX BLOOD GLUCOSE TEST) test strip Use as instructed   Lancets (ONETOUCH ULTRASOFT) lancets Use as instructed once a day for fasting glucose check.   metFORMIN (GLUCOPHAGE) 1000 MG tablet Take 1 tablet (1,000 mg total) by mouth 2 (two) times daily with a meal.   Misc Natural Products (OSTEO BI-FLEX ADV DOUBLE ST) CAPS Take 1 capsule by mouth daily.   pantoprazole (PROTONIX) 40 MG tablet TAKE 1 TABLET EVERY DAY   pioglitazone (ACTOS) 15 MG tablet TAKE 1 TABLET EVERY DAY   Probiotic Product (PROBIOTIC FORMULA) CAPS Take by mouth daily.   amoxicillin (AMOXIL) 875 MG tablet Take 1 tablet (875 mg total) by mouth 2 (two) times daily. (Patient not taking: Reported on 07/17/2020)   aspirin 81 MG tablet Take 1 tablet by mouth daily. (Patient not taking: Reported on 07/17/2020)   hyoscyamine (LEVSIN SL) 0.125 MG SL tablet Place 1 tablet (0.125 mg total) under the tongue every 6 (six) hours as needed. (Patient not taking: Reported on 07/17/2020)   lidocaine (XYLOCAINE) 2 % solution Mix 5 ml with 5 ml of liquid antacid for acute indigetion pain and swallow. May repeat 2-3 times in a day  if needed. (Patient not taking: Reported on 07/17/2020)   sucralfate (CARAFATE) 1 g tablet Take 1 tablet (1 g total) by mouth 4 (four) times daily -  with meals and at bedtime. (Patient not taking: Reported on 07/17/2020)   No facility-administered encounter medications on file as of 07/17/2020.    Allergies  (verified) Patient has no known allergies.   History: Past Medical History:  Diagnosis Date   Actinic keratosis    Arthritis    right thumb   Broken wrist    Diabetes mellitus without complication ([place])    Dysuria    Hyperlipidemia    Hypertension    Wears contact lenses    Past Surgical History:  Procedure Laterality Date   COLONOSCOPY  2005   COLONOSCOPY WITH PROPOFOL N/A 10/16/2016   Procedure: COLONOSCOPY WITH PROPOFOL;  Surgeon: Robert Bellow, MD;  Location: [facility] ENDOSCOPY;  Service: Endoscopy;  Laterality: N/A;   ESOPHAGOGASTRODUODENOSCOPY (EGD) WITH PROPOFOL N/A 12/13/2019   Procedure: ESOPHAGOGASTRODUODENOSCOPY (EGD) WITH BIOPSY;  Surgeon: Lucilla Lame, MD;  Location: [place];  Service: Endoscopy;  Laterality: N/A;  Diabetic - oral meds   PLANTAR'S WART EXCISION     STRABISMUS SURGERY Right    Family History  Problem Relation Age of Onset   Cancer Mother    Alcohol abuse Father    Heart disease Maternal Uncle    Rheumatic fever Maternal Uncle    Diabetes Paternal Grandmother    Emphysema Paternal Grandfather    Social History   Socioeconomic History   Marital status: Married    Spouse name: Not on file   Number of children: 3   Years of education: Not on file   Highest education level: Bachelor's degree (e.g., BA, AB, BS)  Occupational History   Not on file  Tobacco Use   Smoking status: Former Smoker    Types: Cigarettes    Quit date: 1978    Years since quitting: 43.8   Smokeless tobacco: Never Used   Tobacco comment: quit in 1978  Vaping Use   Vaping Use: Never used  Substance and Sexual Activity   Alcohol use: Yes    Alcohol/week: 6.0 standard drinks    Types: 6 Cans of beer per week    Comment: a couple beers on the weekends   Drug use: No   Sexual activity: Not on file  Other Topics Concern   Not on file  Social History Narrative   Not on file   Social Determinants of Health   Financial Resource Strain: Low Risk     Difficulty of Paying Living Expenses: Not hard at all  Food Insecurity: No Food Insecurity   Worried About Charity fundraiser in the Last Year: Never true   [place] in the Last Year: Never true  Transportation Needs: No Transportation Needs   Lack of Transportation (Medical): No   Lack of Transportation (Non-Medical): No  Physical Activity: Sufficiently Active   Days of Exercise per Week: 3 days   Minutes of Exercise per Session: 60 min  Stress: No Stress Concern Present   Feeling of Stress : Not at all  Social Connections: Socially Integrated   Frequency of Communication with Friends and Family: More than three times a week   Frequency of Social Gatherings with Friends and Family: More than three times a week   Attends Religious Services: More than 4 times per year   Active Member of [employer] or Organizations: Yes   Attends [employer]  or Organization Meetings: More than 4 times per year   Marital Status: Married    Tobacco Counseling Counseling given: Not Answered Comment: quit in 1978   Clinical Intake:  Pre-visit preparation completed: Yes  Pain : No/denies pain     Nutritional Risks: None Diabetes: Yes  How often do you need to have someone help you when you read instructions, pamphlets, or other written materials from your doctor or pharmacy?: 1 - Never  Diabetic? Yes  Nutrition Risk Assessment:  Has the patient had any N/V/D within the last 2 months?  No  Does the patient have any non-healing wounds?  No  Has the patient had any unintentional weight loss or weight gain?  No   Diabetes:  Is the patient diabetic?  Yes  If diabetic, was a CBG obtained today?  No  Did the patient bring in their glucometer from home?  No  How often do you monitor your CBG's? Once a day in the morning.   Financial Strains and Diabetes Management:  Are you having any financial strains with the device, your supplies or your medication? No .   Does the patient want to be seen by  Chronic Care Management for management of their diabetes?  No  Would the patient like to be referred to a Nutritionist or for Diabetic Management?  No   Diabetic Exams:  Diabetic Eye Exam: Overdue for diabetic eye exam. Pt has been advised about the importance in completing this exam. Patient advised to call and schedule an eye exam. Eye exam is scheduled for 07/24/20 @ [facility]. Diabetic Foot Exam: Completed 04/20/20   Interpreter Needed?: No  Information entered by :: [facility], LPN   Activities of Daily Living In your present state of health, do you have any difficulty performing the following activities: 07/17/2020 12/13/2019  Hearing? N N  Vision? N N  Difficulty concentrating or making decisions? N N  Walking or climbing stairs? N N  Dressing or bathing? N N  Doing errands, shopping? N -  Preparing Food and eating ? N -  Using the Toilet? N -  In the past six months, have you accidently leaked urine? N -  Do you have problems with loss of bowel control? N -  Managing your Medications? N -  Managing your Finances? N -  Housekeeping or managing your Housekeeping? N -  Some recent data might be hidden    Patient Care Team: Chrismon, Vickki Muff, PA as PCP - General (Physician Assistant) [place], [place] any recent Medical Services you may have received from other than [facility] providers in the past year (date may be approximate).     Assessment:   This is a routine wellness examination for Zian.  Hearing/Vision screen No exam data present  Dietary issues and exercise activities discussed: Current Exercise Habits: Structured exercise class, Type of exercise: strength training/weights;treadmill;walking, Time (Minutes): > 60, Frequency (Times/Week): 3, Weekly Exercise (Minutes/Week): 0, Intensity: Moderate, Exercise limited by: None identified  Goals       Patient Stated     I have reached the donut hole (pt-stated)      Current Barriers:    financial  Pharmacist Clinical Goal(s): Over the next 14 days, Mr.. Hepp will provide the necessary supplementary documents (proof of out of pocket prescription expenditure, proof of household income) needed for medication assistance applications to CCM pharmacist.   Interventions: CCM pharmacist will apply for medication assistance program for Jardiance made by  [employer] Updated 8/10:  patient is over income and does not qualify for [employer] assistance;   based on reported income, he will also be overincome for alternatives such as Trulicity and Ozempic; will coordinate with provider Simona Huh Chrismon regarding generic alternatives such as pioglitazone or glipizide XL; noted history of cancer in patient's mother, though unknown what type of cancer Update 8/13: mother had breast cancer; patient interested in cardio benefits offered by Jardiance (none proven with glipizide or pioglitazone)  Patient Self Care Activities:  Gather necessary documents needed to apply for medication assistance  Please see past updates related to this goal by clicking on the "Past Updates" button in the selected goal        Other     Reduce portion size      Recommend decreasing portion sizes and eating 3 meals a day and 2 healthy snacks in between.        Depression Screen PHQ 2/9 Scores 07/17/2020 06/16/2019 06/16/2018 06/05/2018 06/05/2018 04/29/2017 05/10/2016  PHQ - 2 Score 0 0 0 0 0 0 0  PHQ- 9 Score - - 0 0 - - -    Fall Risk Fall Risk  07/17/2020 01/06/2020 06/16/2019 06/16/2018 06/05/2018  Falls in the past year? 0 1 1 No No  Number falls in past yr: 0 0 0 - -  Injury with Fall? 0 0 0 - -  Follow up - - Falls prevention discussed - -    Any stairs in or around the home? Yes  If so, are there any without handrails? No  Home free of loose throw rugs in walkways, pet beds, electrical cords, etc? Yes  Adequate lighting in your home to reduce risk of falls? Yes   ASSISTIVE DEVICES UTILIZED TO  PREVENT FALLS:  Life alert? No  Use of a cane, walker or w/c? No  Grab bars in the bathroom? No  Shower chair or bench in shower? No  Elevated toilet seat or a handicapped toilet? No    Cognitive Function: Declined today.        Immunizations Immunization History  Administered Date(s) Administered   Hepatitis A 02/02/2004   Influenza, High Dose Seasonal PF 07/08/2014, 06/07/2018, 06/09/2019, 06/07/2020   Influenza,inj,Quad PF,6+ Mos 07/23/2013   Influenza-Unspecified 06/15/2015, 06/30/2017   PFIZER SARS-COV-2 Vaccination 11/17/2019, 12/08/2019   Pneumococcal Conjugate-13 05/10/2016   Pneumococcal Polysaccharide-23 07/18/2017   Tdap 08/22/2009    TDAP status: Due, Education has been provided regarding the importance of this vaccine. Advised may receive this vaccine at local pharmacy or Health Dept. Aware to provide a copy of the vaccination record if obtained from local pharmacy or Health Dept. Verbalized acceptance and understanding. Flu Vaccine status: Up to date Pneumococcal vaccine status: Up to date Covid-19 vaccine status: Completed vaccines  Qualifies for Shingles Vaccine? Yes   Zostavax completed No   Shingrix Completed?: No.    Education has been provided regarding the importance of this vaccine. Patient has been advised to call insurance company to determine out of pocket expense if they have not yet received this vaccine. Advised may also receive vaccine at local pharmacy or Health Dept. Verbalized acceptance and understanding.  Screening Tests Health Maintenance  Topic Date Due   OPHTHALMOLOGY EXAM  06/25/2016   TETANUS/TDAP  07/17/2021 (Originally 08/23/2019)   HEMOGLOBIN A1C  10/21/2020   FOOT EXAM  04/20/2021   URINE MICROALBUMIN  04/20/2021   COLONOSCOPY  10/16/2021   INFLUENZA VACCINE  Completed   COVID-19 Vaccine  Completed  Hepatitis C Screening  Completed   PNA vac Low Risk Adult  Completed    Health Maintenance  Health Maintenance Due  Topic  Date Due   OPHTHALMOLOGY EXAM  06/25/2016    Colorectal cancer screening: Completed 10/16/16. Repeat every 5 years  Lung Cancer Screening: (Low Dose CT Chest recommended if Age 58-80 years, 30 pack-year currently smoking OR have quit w/in 15years.) does not qualify.    Additional Screening:  Hepatitis C Screening: Up to date  Vision Screening: Recommended annual ophthalmology exams for early detection of glaucoma and other disorders of the eye. Is the patient up to date with their annual eye exam?  Yes  Who is the provider or what is the name of the office in which the patient attends annual eye exams? [facility] If pt is not established with a provider, would they like to be referred to a provider to establish care? No .   Dental Screening: Recommended annual dental exams for proper oral hygiene  Community Resource Referral / Chronic Care Management: CRR required this visit?  No   CCM required this visit?  No      Plan:     I have personally reviewed and noted the following in the patient's chart:   Medical and social history Use of alcohol, tobacco or illicit drugs  Current medications and supplements Functional ability and status Nutritional status Physical activity Advanced directives List of other physicians Hospitalizations, surgeries, and ER visits in previous 12 months Vitals Screenings to include cognitive, depression, and falls Referrals and appointments  In addition, I have reviewed and discussed with patient certain preventive protocols, quality metrics, and best practice recommendations. A written personalized care plan for preventive services as well as general preventive health recommendations were provided to patient.     Mostafa Yuan [place], [place]   09/18/7587   Nurse Notes: Pt plans to have an eye exam on 07/24/20.  Reviewed note and plan of Nurse Health Advisor. Agree with documentation and recommendations.

## 2020-07-17 ENCOUNTER — Ambulatory Visit (INDEPENDENT_AMBULATORY_CARE_PROVIDER_SITE_OTHER): Payer: Medicare HMO

## 2020-07-17 ENCOUNTER — Other Ambulatory Visit: Payer: Self-pay

## 2020-07-17 ENCOUNTER — Telehealth: Payer: Self-pay

## 2020-07-17 DIAGNOSIS — Z0000 Encounter for general adult medical examination without abnormal findings: Secondary | ICD-10-CM

## 2020-07-17 NOTE — Telephone Encounter (Signed)
Pt advised and said thank you .

## 2020-07-17 NOTE — Telephone Encounter (Signed)
Pt would like to know if we have any Jardiance 25 mg samples and if so if he can have them? Pt is currently in the donut hole and has to pay $450 for a 90 day supply. Please advise, thank you.

## 2020-07-17 NOTE — Telephone Encounter (Signed)
Some samples left at the front for him to pick up.

## 2020-07-17 NOTE — Patient Instructions (Signed)
[name] , Thank you for taking time to come for your Medicare Wellness Visit. I appreciate your ongoing commitment to your health goals. Please review the following plan we discussed and let me know if I can assist you in the future.   Screening recommendations/referrals: Colonoscopy: Up to date, due 09/2021 Recommended yearly ophthalmology/optometry visit for glaucoma screening and checkup Recommended yearly dental visit for hygiene and checkup  Vaccinations: Influenza vaccine: Done 06/07/20 Pneumococcal vaccine: Completed series Tdap vaccine: Currently due, declined receiving.  Shingles vaccine: Shingrix discussed. Please contact your pharmacy for coverage information.     Advanced directives: Advance directive discussed with you today. Even though you declined this today please call our office should you change your mind and we can give you the proper paperwork for you to fill out.  Conditions/risks identified: Recommend decreasing portion sizes and eating 3 meals a day and 2 healthy snacks in between.   Next appointment: 10/10/20 @ 8:00 AM with Steven Miranda 72 Years and Older, Male Preventive care refers to lifestyle choices and visits with your health care provider that can promote health and wellness. What does preventive care include?  A yearly physical exam. This is also called an annual well check.  Dental exams once or twice a year.  Routine eye exams. Ask your health care provider how often you should have your eyes checked.  Personal lifestyle choices, including:  Daily care of your teeth and gums.  Regular physical activity.  Eating a healthy diet.  Avoiding tobacco and drug use.  Limiting alcohol use.  Practicing safe sex.  Taking low doses of aspirin every day.  Taking vitamin and mineral supplements as recommended by your health care provider. What happens during an annual well check? The services and screenings done by your health  care provider during your annual well check will depend on your age, overall health, lifestyle risk factors, and family history of disease. Counseling  Your health care provider may ask you questions about your:  Alcohol use.  Tobacco use.  Drug use.  Emotional well-being.  Home and relationship well-being.  Sexual activity.  Eating habits.  History of falls.  Memory and ability to understand (cognition).  Work and work Statistician. Screening  You may have the following tests or measurements:  Height, weight, and BMI.  Blood pressure.  Lipid and cholesterol levels. These may be checked every 5 years, or more frequently if you are over 72 years old.  Skin check.  Lung cancer screening. You may have this screening every year starting at age 72 if you have a 30-pack-year history of smoking and currently smoke or have quit within the past 15 years.  Fecal occult blood test (FOBT) of the stool. You may have this test every year starting at age 72.  Flexible sigmoidoscopy or colonoscopy. You may have a sigmoidoscopy every 5 years or a colonoscopy every 10 years starting at age 30.  Prostate cancer screening. Recommendations will vary depending on your family history and other risks.  Hepatitis C blood test.  Hepatitis B blood test.  Sexually transmitted disease (STD) testing.  Diabetes screening. This is done by checking your blood sugar (glucose) after you have not eaten for a while (fasting). You may have this done every 1-3 years.  Abdominal aortic aneurysm (AAA) screening. You may need this if you are a current or former smoker.  Osteoporosis. You may be screened starting at age 72 if you are at high risk. Talk  with your health care provider about your test results, treatment options, and if necessary, the need for more tests. Vaccines  Your health care provider may recommend certain vaccines, such as:  Influenza vaccine. This is recommended every  year.  Tetanus, diphtheria, and acellular pertussis (Tdap, Td) vaccine. You may need a Td booster every 10 years.  Zoster vaccine. You may need this after age 72.  Pneumococcal 13-valent conjugate (PCV13) vaccine. One dose is recommended after age 72.  Pneumococcal polysaccharide (PPSV23) vaccine. One dose is recommended after age 72. Talk to your health care provider about which screenings and vaccines you need and how often you need them. This information is not intended to replace advice given to you by your health care provider. Make sure you discuss any questions you have with your health care provider. Document Released: 10/13/2015 Document Revised: 06/05/2016 Document Reviewed: 07/18/2015 Elsevier Interactive Patient Education  2017 [place] Prevention in the Home Falls can cause injuries. They can happen to people of all ages. There are many things you can do to make your home safe and to help prevent falls. What can I do on the outside of my home?  Regularly fix the edges of walkways and driveways and fix any cracks.  Remove anything that might make you trip as you walk through a door, such as a raised step or threshold.  Trim any bushes or trees on the path to your home.  Use bright outdoor lighting.  Clear any walking paths of anything that might make someone trip, such as rocks or tools.  Regularly check to see if handrails are loose or broken. Make sure that both sides of any steps have handrails.  Any raised decks and porches should have guardrails on the edges.  Have any leaves, snow, or ice cleared regularly.  Use sand or salt on walking paths during winter.  Clean up any spills in your garage right away. This includes oil or grease spills. What can I do in the bathroom?  Use night lights.  Install grab bars by the toilet and in the tub and shower. Do not use towel bars as grab bars.  Use non-skid mats or decals in the tub or shower.  If you  need to sit down in the shower, use a plastic, non-slip stool.  Keep the floor dry. Clean up any water that spills on the floor as soon as it happens.  Remove soap buildup in the tub or shower regularly.  Attach bath mats securely with double-sided non-slip rug tape.  Do not have throw rugs and other things on the floor that can make you trip. What can I do in the bedroom?  Use night lights.  Make sure that you have a light by your bed that is easy to reach.  Do not use any sheets or blankets that are too big for your bed. They should not hang down onto the floor.  Have a firm chair that has side arms. You can use this for support while you get dressed.  Do not have throw rugs and other things on the floor that can make you trip. What can I do in the kitchen?  Clean up any spills right away.  Avoid walking on wet floors.  Keep items that you use a lot in easy-to-reach places.  If you need to reach something above you, use a strong step stool that has a grab bar.  Keep electrical cords out of the way.  Do  not use floor polish or wax that makes floors slippery. If you must use wax, use non-skid floor wax.  Do not have throw rugs and other things on the floor that can make you trip. What can I do with my stairs?  Do not leave any items on the stairs.  Make sure that there are handrails on both sides of the stairs and use them. Fix handrails that are broken or loose. Make sure that handrails are as long as the stairways.  Check any carpeting to make sure that it is firmly attached to the stairs. Fix any carpet that is loose or worn.  Avoid having throw rugs at the top or bottom of the stairs. If you do have throw rugs, attach them to the floor with carpet tape.  Make sure that you have a light switch at the top of the stairs and the bottom of the stairs. If you do not have them, ask someone to add them for you. What else can I do to help prevent falls?  Wear shoes  that:  Do not have high heels.  Have rubber bottoms.  Are comfortable and fit you well.  Are closed at the toe. Do not wear sandals.  If you use a stepladder:  Make sure that it is fully opened. Do not climb a closed stepladder.  Make sure that both sides of the stepladder are locked into place.  Ask someone to hold it for you, if possible.  Clearly mark and make sure that you can see:  Any grab bars or handrails.  First and last steps.  Where the edge of each step is.  Use tools that help you move around (mobility aids) if they are needed. These include:  Canes.  Walkers.  Scooters.  Crutches.  Turn on the lights when you go into a dark area. Replace any light bulbs as soon as they burn out.  Set up your furniture so you have a clear path. Avoid moving your furniture around.  If any of your floors are uneven, fix them.  If there are any pets around you, be aware of where they are.  Review your medicines with your doctor. Some medicines can make you feel dizzy. This can increase your chance of falling. Ask your doctor what other things that you can do to help prevent falls. This information is not intended to replace advice given to you by your health care provider. Make sure you discuss any questions you have with your health care provider. Document Released: 07/13/2009 Document Revised: 02/22/2016 Document Reviewed: 10/21/2014 Elsevier Interactive Patient Education  2017 [employer].

## 2020-07-24 DIAGNOSIS — E119 Type 2 diabetes mellitus without complications: Secondary | ICD-10-CM | POA: Diagnosis not present

## 2020-07-25 ENCOUNTER — Other Ambulatory Visit: Payer: Self-pay | Admitting: Family Medicine

## 2020-07-25 ENCOUNTER — Encounter: Payer: Self-pay | Admitting: Family Medicine

## 2020-07-25 DIAGNOSIS — I10 Essential (primary) hypertension: Secondary | ICD-10-CM

## 2020-07-25 MED ORDER — AMLODIPINE BESYLATE 5 MG PO TABS: 5.0000 mg | ORAL_TABLET | Freq: Every day | ORAL | 3 refills | Status: DC

## 2020-07-25 NOTE — Telephone Encounter (Signed)
He has appt. scheduled with you 10/10/2020

## 2020-07-26 ENCOUNTER — Other Ambulatory Visit: Payer: Self-pay | Admitting: Family Medicine

## 2020-07-26 DIAGNOSIS — E0800 Diabetes mellitus due to underlying condition with hyperosmolarity without nonketotic hyperglycemic-hyperosmolar coma (NKHHC): Secondary | ICD-10-CM

## 2020-07-26 DIAGNOSIS — R1013 Epigastric pain: Secondary | ICD-10-CM

## 2020-09-19 ENCOUNTER — Other Ambulatory Visit: Payer: Self-pay | Admitting: Family Medicine

## 2020-09-19 ENCOUNTER — Encounter: Payer: Self-pay | Admitting: Family Medicine

## 2020-09-19 DIAGNOSIS — I10 Essential (primary) hypertension: Secondary | ICD-10-CM

## 2020-09-19 MED ORDER — AMLODIPINE BESYLATE 5 MG PO TABS: 5.0000 mg | ORAL_TABLET | Freq: Every day | ORAL | 3 refills | Status: DC

## 2020-10-10 ENCOUNTER — Encounter: Payer: Self-pay | Admitting: Family Medicine

## 2020-10-10 ENCOUNTER — Other Ambulatory Visit: Payer: Self-pay

## 2020-10-10 ENCOUNTER — Ambulatory Visit (INDEPENDENT_AMBULATORY_CARE_PROVIDER_SITE_OTHER): Payer: Medicare HMO | Admitting: Family Medicine

## 2020-10-10 VITALS — BP 145/80 | HR 61 | Temp 97.9°F | Wt 257.0 lb

## 2020-10-10 DIAGNOSIS — E785 Hyperlipidemia, unspecified: Secondary | ICD-10-CM

## 2020-10-10 DIAGNOSIS — E119 Type 2 diabetes mellitus without complications: Secondary | ICD-10-CM

## 2020-10-10 DIAGNOSIS — I10 Essential (primary) hypertension: Secondary | ICD-10-CM

## 2020-10-10 NOTE — Progress Notes (Signed)
Established patient visit   Patient: Steven Miranda   DOB: 1946/04/01   75 y.o. Male  MRN: [id number] Visit Date: 10/10/2020  Today's healthcare provider: Vernie Murders, PA-C   No chief complaint on file.  Subjective    HPI  Hypertension, follow-up  BP Readings from Last 3 Encounters:  10/10/20 (!) 145/80  04/20/20 125/74  01/24/20 (!) 154/90   Wt Readings from Last 3 Encounters:  10/10/20 257 lb (116.6 kg)  04/20/20 246 lb (111.6 kg)  01/24/20 250 lb (113.4 kg)     He was last seen for hypertension 6 months ago.  BP at that visit was 125/74. Management since that visit includes none.  He reports good compliance with treatment. He is not having side effects.  He is following a Regular diet. He is exercising. He does not smoke.  Use of agents associated with hypertension: none.   Outside blood pressures are are not being checked. Symptoms: No chest pain No chest pressure  No palpitations No syncope  No dyspnea No orthopnea  No paroxysmal nocturnal dyspnea No lower extremity edema   Pertinent labs: Lab Results  Component Value Date   CHOL 155 04/20/2020   HDL 64 04/20/2020   LDLCALC 76 04/20/2020   TRIG 76 04/20/2020   CHOLHDL 2.4 04/20/2020   Lab Results  Component Value Date   NA 141 04/20/2020   K 4.3 04/20/2020   CREATININE 1.08 04/20/2020   GFRNONAA 68 04/20/2020   GFRAA 79 04/20/2020   GLUCOSE 136 (H) 04/20/2020     The 10-year ASCVD risk score Mikey Bussing DC Jr., et al., 2013) is: 43.5%   --------------------------------------------------------------------------------------------------- Diabetes Mellitus Type II, Follow-up  Lab Results  Component Value Date   HGBA1C 6.7 (H) 04/20/2020   HGBA1C  04/20/2020     Comment:     total Hgb low    HGBA1C 7.9 (A) 08/20/2019   HGBA1C 7.9 08/20/2019   HGBA1C 7.9 (A) 08/20/2019   HGBA1C 7.0 08/20/2019   Wt Readings from Last 3 Encounters:  10/10/20 257 lb (116.6 kg)  04/20/20 246 lb (111.6 kg)   01/24/20 250 lb (113.4 kg)   Last seen for diabetes 6 months ago.  Management since then includes none. He reports good compliance with treatment. He is not having side effects.  Symptoms: No fatigue No foot ulcerations  No appetite changes No nausea  No paresthesia of the feet  No polydipsia  No polyuria No visual disturbances   No vomiting     Home blood sugar records: fasting range: 80's-160's  Episodes of hypoglycemia? No    Current insulin regiment: none Most Recent Eye Exam: Oct 2021   Pertinent Labs: Lab Results  Component Value Date   CHOL 155 04/20/2020   HDL 64 04/20/2020   LDLCALC 76 04/20/2020   TRIG 76 04/20/2020   CHOLHDL 2.4 04/20/2020   Lab Results  Component Value Date   NA 141 04/20/2020   K 4.3 04/20/2020   CREATININE 1.08 04/20/2020   GFRNONAA 68 04/20/2020   GFRAA 79 04/20/2020   GLUCOSE 136 (H) 04/20/2020       Past Medical History:  Diagnosis Date  . Actinic keratosis   . Arthritis    right thumb  . Broken wrist   . Diabetes mellitus without complication ([place])   . Dysuria   . Hyperlipidemia   . Hypertension   . Wears contact lenses    Past Surgical History:  Procedure Laterality Date  .  COLONOSCOPY  2005  . COLONOSCOPY WITH PROPOFOL N/A 10/16/2016   Procedure: COLONOSCOPY WITH PROPOFOL;  Surgeon: Robert Bellow, MD;  Location[facility] ENDOSCOPY;  Service: Endoscopy;  Laterality: N/A;  . ESOPHAGOGASTRODUODENOSCOPY (EGD) WITH PROPOFOL N/A 12/13/2019   Procedure: ESOPHAGOGASTRODUODENOSCOPY (EGD) WITH BIOPSY;  Surgeon: Lucilla Lame, MD;  Location: [place];  Service: Endoscopy;  Laterality: N/A;  Diabetic - oral meds  . PLANTAR'S WART EXCISION    . STRABISMUS SURGERY Right    Social History   Tobacco Use  . Smoking status: Former Smoker    Types: Cigarettes    Quit date: 1978    Years since quitting: 44.0  . Smokeless tobacco: Never Used  . Tobacco comment: quit in 1978  Vaping Use  . Vaping Use: Never used   Substance Use Topics  . Alcohol use: Yes    Alcohol/week: 6.0 standard drinks    Types: 6 Cans of beer per week    Comment: a couple beers on the weekends  . Drug use: No   Family History  Problem Relation Age of Onset  . Cancer Mother   . Alcohol abuse Father   . Heart disease Maternal Uncle   . Rheumatic fever Maternal Uncle   . Diabetes Paternal Grandmother   . Emphysema Paternal Grandfather    No Known Allergies    Medications: Outpatient Medications Prior to Visit  Medication Sig  . ACCU-CHEK SMARTVIEW test strip CHECK  FASTING  BLOOD  SUGAR EVERY MORNING AS INSTRUCTED  . amLODipine (NORVASC) 5 MG tablet Take 1 tablet (5 mg total) by mouth at bedtime.  Marland Kitchen atorvastatin (LIPITOR) 20 MG tablet Take 1 tablet (20 mg total) by mouth daily.  . Blood Glucose Monitoring Suppl (ACCU-CHEK NANO SMARTVIEW) w/Device KIT Use glucometer (Accu-Chek Nano Glucometer) to test fasting glucose daily.  . empagliflozin (JARDIANCE) 25 MG TABS tablet Take 25 mg by mouth daily before breakfast.  . Ferrous Sulfate (IRON SUPPLEMENT PO) Take 1 tablet by mouth daily.  . fluticasone (FLONASE) 50 MCG/ACT nasal spray Place 2 sprays into both nostrils daily.  Marland Kitchen glucose blood (TRUE METRIX BLOOD GLUCOSE TEST) test strip Use as instructed  . Lancets (ONETOUCH ULTRASOFT) lancets Use as instructed once a day for fasting glucose check.  . metFORMIN (GLUCOPHAGE) 1000 MG tablet TAKE 1 TABLET TWICE DAILY WITH MEALS  . Misc Natural Products (OSTEO BI-FLEX ADV DOUBLE ST) CAPS Take 1 capsule by mouth daily.  . pantoprazole (PROTONIX) 40 MG tablet TAKE 1 TABLET EVERY DAY  . pioglitazone (ACTOS) 15 MG tablet TAKE 1 TABLET EVERY DAY  . Probiotic Product (PROBIOTIC FORMULA) CAPS Take by mouth daily.  Marland Kitchen amoxicillin (AMOXIL) 875 MG tablet Take 1 tablet (875 mg total) by mouth 2 (two) times daily.  Marland Kitchen aspirin 81 MG tablet Take 1 tablet by mouth daily.  . hyoscyamine (LEVSIN SL) 0.125 MG SL tablet Place 1 tablet (0.125 mg total)  under the tongue every 6 (six) hours as needed.  . lidocaine (XYLOCAINE) 2 % solution Mix 5 ml with 5 ml of liquid antacid for acute indigetion pain and swallow. May repeat 2-3 times in a day if needed.  . sucralfate (CARAFATE) 1 g tablet Take 1 tablet (1 g total) by mouth 4 (four) times daily -  with meals and at bedtime.   No facility-administered medications prior to visit.    Review of Systems  Constitutional: Negative.   HENT: Negative.   Respiratory: Negative.   Cardiovascular: Negative.   Gastrointestinal: Negative.  Genitourinary: Negative.       Objective    BP (!) 145/80 (BP Location: Right Arm, Patient Position: Sitting, Cuff Size: Normal)   Pulse 61   Temp 97.9 F (36.6 C) (Oral)   Wt 257 lb (116.6 kg)   SpO2 100%   BMI 33.00 kg/m  BP Readings from Last 3 Encounters:  10/10/20 (!) 145/80  04/20/20 125/74  01/24/20 (!) 154/90   Wt Readings from Last 3 Encounters:  10/10/20 257 lb (116.6 kg)  04/20/20 246 lb (111.6 kg)  01/24/20 250 lb (113.4 kg)    Physical Exam Constitutional:      General: He is not in acute distress.    Appearance: He is well-developed and well-nourished.  HENT:     Head: Normocephalic and atraumatic.     Right Ear: Hearing and tympanic membrane normal.     Left Ear: Hearing and tympanic membrane normal.     Nose: Nose normal.  Eyes:     General: Lids are normal. No scleral icterus.       Right eye: No discharge.        Left eye: No discharge.     Conjunctiva/sclera: Conjunctivae normal.     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  Cardiovascular:     Rate and Rhythm: Normal rate and regular rhythm.     Heart sounds: Normal heart sounds.  Pulmonary:     Effort: Pulmonary effort is normal. No respiratory distress.     Breath sounds: Normal breath sounds.  Musculoskeletal:        General: Normal range of motion.     Cervical back: Neck supple.  Skin:    General: Skin is intact.     Findings: No lesion or  rash.  Neurological:     Mental Status: He is alert and oriented to person, place, and time.  Psychiatric:        Mood and Affect: Mood and affect normal.        Speech: Speech normal.        Behavior: Behavior normal.        Thought Content: Thought content normal.       No results found for any visits on 10/10/20.  Assessment & Plan     1. Essential (primary) hypertension BP essentially normal today. Tolerating Amlodipine 5 mg q hs. Check routine follow up labs.  - CBC with Differential/Platelet - Comprehensive metabolic panel - Lipid Panel With LDL/HDL Ratio - TSH  2. Type 2 diabetes mellitus without complication, without long-term current use of insulin (HCC) Hgb A1C 6.7% on 04-20-20 without hypoglycemic episodes on Jardiance 25 mg qd, Metformin 1000 mg BID and Actos 15 mg qd. No polyuria, polydipsia or peripheral neuropathy. Encouraged to get ophthalmology exam annually. Recheck routine labs. - CBC with Differential/Platelet - Comprehensive metabolic panel - Hemoglobin A1c - Lipid Panel With LDL/HDL Ratio  3. Hyperlipidemia, unspecified hyperlipidemia type Tolerating Atorvastatin 20 mg qd without side effects. Has gained 11 lbs since July 2021. Continue low fat diet and regular exercise to lose weight. Recheck labs. - Comprehensive metabolic panel - Lipid Panel With LDL/HDL Ratio - TSH   No follow-ups on file.      I, Field Staniszewski, PA-C, have reviewed all documentation for this visit. The documentation on 10/10/20 for the exam, diagnosis, procedures, and orders are all accurate and complete.    Vernie Murders, PA-C  [employer] [phone] (phone) [phone] (fax)  [place]

## 2020-10-11 ENCOUNTER — Encounter: Payer: Self-pay | Admitting: Family Medicine

## 2020-10-11 ENCOUNTER — Ambulatory Visit (INDEPENDENT_AMBULATORY_CARE_PROVIDER_SITE_OTHER): Payer: Medicare HMO | Admitting: Physician Assistant

## 2020-10-11 ENCOUNTER — Ambulatory Visit: Payer: Self-pay

## 2020-10-11 ENCOUNTER — Other Ambulatory Visit: Payer: Self-pay

## 2020-10-11 VITALS — BP 148/95 | HR 66 | Temp 97.8°F | Wt 257.1 lb

## 2020-10-11 DIAGNOSIS — I10 Essential (primary) hypertension: Secondary | ICD-10-CM | POA: Diagnosis not present

## 2020-10-11 DIAGNOSIS — R009 Unspecified abnormalities of heart beat: Secondary | ICD-10-CM | POA: Diagnosis not present

## 2020-10-11 DIAGNOSIS — R000 Tachycardia, unspecified: Secondary | ICD-10-CM | POA: Diagnosis not present

## 2020-10-11 LAB — COMPREHENSIVE METABOLIC PANEL
ALT: 20 IU/L (ref 0–44)
AST: 17 IU/L (ref 0–40)
Albumin/Globulin Ratio: 1.9 (ref 1.2–2.2)
Albumin: 4.7 g/dL (ref 3.7–4.7)
Alkaline Phosphatase: 69 IU/L (ref 44–121)
BUN/Creatinine Ratio: 11 (ref 10–24)
BUN: 12 mg/dL (ref 8–27)
Bilirubin Total: 0.5 mg/dL (ref 0.0–1.2)
CO2: 25 mmol/L (ref 20–29)
Calcium: 9.3 mg/dL (ref 8.6–10.2)
Chloride: 100 mmol/L (ref 96–106)
Creatinine, Ser: 1.08 mg/dL (ref 0.76–1.27)
GFR calc Af Amer: 78 mL/min/{1.73_m2} (ref >59)
GFR calc non Af Amer: 68 mL/min/{1.73_m2} (ref >59)
Globulin, Total: 2.5 g/dL (ref 1.5–4.5)
Glucose: 136 mg/dL — ABNORMAL HIGH (ref 65–99)
Potassium: 4.6 mmol/L (ref 3.5–5.2)
Sodium: 138 mmol/L (ref 134–144)
Total Protein: 7.2 g/dL (ref 6.0–8.5)

## 2020-10-11 LAB — TSH: TSH: 3.51 u[IU]/mL (ref 0.450–4.500)

## 2020-10-11 LAB — CBC WITH DIFFERENTIAL/PLATELET
Basophils Absolute: 0 10*3/uL (ref 0.0–0.2)
Basos: 1 %
EOS (ABSOLUTE): 0.1 10*3/uL (ref 0.0–0.4)
Eos: 3 %
Hematocrit: 44 % (ref 37.5–51.0)
Hemoglobin: 15.6 g/dL (ref 13.0–17.7)
Immature Grans (Abs): 0 10*3/uL (ref 0.0–0.1)
Immature Granulocytes: 0 %
Lymphocytes Absolute: 1 10*3/uL (ref 0.7–3.1)
Lymphs: 24 %
MCH: 32.6 pg (ref 26.6–33.0)
MCHC: 35.5 g/dL (ref 31.5–35.7)
MCV: 92 fL (ref 79–97)
Monocytes Absolute: 0.5 10*3/uL (ref 0.1–0.9)
Monocytes: 11 %
Neutrophils Absolute: 2.5 10*3/uL (ref 1.4–7.0)
Neutrophils: 61 %
Platelets: 221 10*3/uL (ref 150–450)
RBC: 4.79 x10E6/uL (ref 4.14–5.80)
RDW: 12.2 % (ref 11.6–15.4)
WBC: 4.1 10*3/uL (ref 3.4–10.8)

## 2020-10-11 LAB — LIPID PANEL WITH LDL/HDL RATIO
Cholesterol, Total: 152 mg/dL (ref 100–199)
HDL: 65 mg/dL (ref >39)
LDL Chol Calc (NIH): 74 mg/dL (ref 0–99)
LDL/HDL Ratio: 1.1 ratio (ref 0.0–3.6)
Triglycerides: 65 mg/dL (ref 0–149)
VLDL Cholesterol Cal: 13 mg/dL (ref 5–40)

## 2020-10-11 LAB — HEMOGLOBIN A1C
Est. average glucose Bld gHb Est-mCnc: 148 mg/dL
Hgb A1c MFr Bld: 6.8 % — ABNORMAL HIGH (ref 4.8–5.6)

## 2020-10-11 NOTE — Patient Instructions (Signed)
Sinus Tachycardia  Sinus tachycardia is a kind of fast heartbeat. In sinus tachycardia, the heart beats more than 100 times a minute. Sinus tachycardia starts in a part of the heart called the sinus node. Sinus tachycardia may be harmless, or it may be a sign of a serious condition. What are the causes? This condition may be caused by:  Exercise or exertion.  A fever.  Pain.  Loss of body fluids (dehydration).  Severe bleeding (hemorrhage).  Anxiety and stress.  Certain substances, including: ? Alcohol. ? Caffeine. ? Tobacco and nicotine products. ? Cold medicines. ? Illegal drugs.  Medical conditions including: ? Heart disease. ? An infection. ? An overactive thyroid (hyperthyroidism). ? A lack of red blood cells (anemia). What are the signs or symptoms? Symptoms of this condition include:  A feeling that the heart is beating quickly (palpitations).  Suddenly noticing your heartbeat (cardiac awareness).  Dizziness.  Tiredness (fatigue).  Shortness of breath.  Chest pain.  Nausea.  Fainting. How is this diagnosed? This condition is diagnosed with:  A physical exam.  Other tests, such as: ? Blood tests. ? An electrocardiogram (ECG). This test measures the electrical activity of the heart. ? Ambulatory cardiac monitor. This records your heartbeats for 24 hours or more. You may be referred to a heart specialist (cardiologist). How is this treated? Treatment for this condition depends on the cause or the underlying condition. Treatment may involve:  Treating the underlying condition.  Taking new medicines or changing your current medicines as told by your health care provider.  Making changes to your diet or lifestyle. Follow these instructions at home: Lifestyle  Do not use any products that contain nicotine or tobacco, such as cigarettes and e-cigarettes. If you need help quitting, ask your health care provider.  Do not use illegal drugs, such as  cocaine.  Learn relaxation methods to help you when you get stressed or anxious. These include deep breathing.  Avoid caffeine or other stimulants.   Alcohol use  Do not drink alcohol if: ? Your health care provider tells you not to drink. ? You are pregnant, may be pregnant, or are planning to become pregnant.  If you drink alcohol, limit how much you have: ? 0-1 drink a day for women. ? 0-2 drinks a day for men.  Be aware of how much alcohol is in your drink. In the [place] one drink equals one typical bottle of beer (12 oz), one-half glass of wine (5 oz), or one shot of hard liquor (1 oz).   General instructions  Drink enough fluids to keep your urine pale yellow.  Take over-the-counter and prescription medicines only as told by your health care provider.  Keep all follow-up visits as told by your health care provider. This is important. Contact a health care provider if you have:  A fever.  Vomiting or diarrhea that does not go away. Get help right away if you:  Have pain in your chest, upper arms, jaw, or neck.  Become weak or dizzy.  Feel faint.  Have palpitations that do not go away. Summary  In sinus tachycardia, the heart beats more than 100 times a minute.  Sinus tachycardia may be harmless, or it may be a sign of a serious condition.  Treatment for this condition depends on the cause or the underlying condition.  Get help right away if you have pain in your chest, upper arms, jaw, or neck. This information is not intended to replace advice given to   you by your health care provider. Make sure you discuss any questions you have with your health care provider. Document Revised: 11/05/2017 Document Reviewed: 11/05/2017 Elsevier Patient Education  2021 [employer]  

## 2020-10-11 NOTE — Progress Notes (Signed)
Established patient visit   Patient: Steven Miranda   DOB: [birth year]   75 y.o. Male  MRN: [id number] Visit Date: 10/11/2020  Today's healthcare provider: Trinna Post, PA-C   No chief complaint on file.  Subjective    HPI  Patient reports that when he woke up from out his sleep he noticed elevated heart rate reading of 102. He denies dizziness, syncope, nausea, vomiting, chest pain. He says this continued into the next morning. His typical resting heart rate is 60-70 bpM. He has not been drinking more caffeine than normal. He is not using drugs. He denies increased anxiety.   Orthostatic VS for the past 72 hrs (Last 3 readings):  Orthostatic BP Patient Position BP Location Cuff Size Orthostatic Pulse  10/11/20 1332 140/81 Sitting Left Arm Large 74  10/11/20 1328 (!) 141/92 Sitting Left Arm Large 69  10/11/20 1325 159/89 Supine Left Arm Large 72     Medications: Outpatient Medications Prior to Visit  Medication Sig  . ACCU-CHEK SMARTVIEW test strip CHECK  FASTING  BLOOD  SUGAR EVERY MORNING AS INSTRUCTED  . amLODipine (NORVASC) 5 MG tablet Take 1 tablet (5 mg total) by mouth at bedtime.  Marland Kitchen amoxicillin (AMOXIL) 875 MG tablet Take 1 tablet (875 mg total) by mouth 2 (two) times daily.  Marland Kitchen aspirin 81 MG tablet Take 1 tablet by mouth daily.  Marland Kitchen atorvastatin (LIPITOR) 20 MG tablet Take 1 tablet (20 mg total) by mouth daily.  . Blood Glucose Monitoring Suppl (ACCU-CHEK NANO SMARTVIEW) w/Device KIT Use glucometer (Accu-Chek Nano Glucometer) to test fasting glucose daily.  . empagliflozin (JARDIANCE) 25 MG TABS tablet Take 25 mg by mouth daily before breakfast.  . Ferrous Sulfate (IRON SUPPLEMENT PO) Take 1 tablet by mouth daily.  . fluticasone (FLONASE) 50 MCG/ACT nasal spray Place 2 sprays into both nostrils daily.  Marland Kitchen glucose blood (TRUE METRIX BLOOD GLUCOSE TEST) test strip Use as instructed  . hyoscyamine (LEVSIN SL) 0.125 MG SL tablet Place 1 tablet (0.125 mg total) under the  tongue every 6 (six) hours as needed.  . Lancets (ONETOUCH ULTRASOFT) lancets Use as instructed once a day for fasting glucose check.  . lidocaine (XYLOCAINE) 2 % solution Mix 5 ml with 5 ml of liquid antacid for acute indigetion pain and swallow. May repeat 2-3 times in a day if needed.  . metFORMIN (GLUCOPHAGE) 1000 MG tablet TAKE 1 TABLET TWICE DAILY WITH MEALS  . Misc Natural Products (OSTEO BI-FLEX ADV DOUBLE ST) CAPS Take 1 capsule by mouth daily.  . pantoprazole (PROTONIX) 40 MG tablet TAKE 1 TABLET EVERY DAY  . pioglitazone (ACTOS) 15 MG tablet TAKE 1 TABLET EVERY DAY  . Probiotic Product (PROBIOTIC FORMULA) CAPS Take by mouth daily.  . sucralfate (CARAFATE) 1 g tablet Take 1 tablet (1 g total) by mouth 4 (four) times daily -  with meals and at bedtime.   No facility-administered medications prior to visit.    Review of Systems  Constitutional: Negative.   Respiratory: Negative.  Negative for chest tightness and shortness of breath.   Cardiovascular: Negative for chest pain and palpitations.  Neurological: Negative for dizziness, light-headedness and headaches.  Hematological: Negative.       Objective    BP (!) 148/95 (BP Location: Left Arm, Patient Position: Sitting, Cuff Size: Large)   Pulse 66   Temp 97.8 F (36.6 C) (Oral)   Wt 257 lb 1.8 oz (116.6 kg)   SpO2 99%   BMI  33.01 kg/m    Physical Exam Constitutional:      Appearance: Normal appearance.  Cardiovascular:     Rate and Rhythm: Normal rate and regular rhythm.     Heart sounds: Normal heart sounds.  Pulmonary:     Effort: Pulmonary effort is normal.     Breath sounds: Normal breath sounds.  Skin:    General: Skin is warm and dry.  Neurological:     General: No focal deficit present.     Mental Status: He is alert and oriented to person, place, and time.  Psychiatric:        Mood and Affect: Mood normal.        Behavior: Behavior normal.       No results found for any visits on 10/11/20.   Assessment & Plan    1. Elevated heart rate with elevated blood pressure and diagnosis of hypertension  DDx: paroxysmal supraventricular tachycardia, atrial fibrillation, atrial flutter.   2. Essential (primary) hypertension  - EKG 12-Lead  3. Tachycardia  - Ambulatory referral to Cardiology   Return if symptoms worsen or fail to improve.      ITrinna Post, PA-C, have reviewed all documentation for this visit. The documentation on 10/11/20 for the exam, diagnosis, procedures, and orders are all accurate and complete.  The entirety of the information documented in the History of Present Illness, Review of Systems and Physical Exam were personally obtained by me. Portions of this information were initially documented by [facility] and reviewed by me for thoroughness and accuracy.   I spent 20 minutes dedicated to the care of this patient on the date of this encounter to include pre-visit review of records, face-to-face time with the patient discussing tachycardia, and post visit ordering of testing.    Paulene Floor  [facility] [phone] (phone) [phone] (fax)  [place]

## 2020-10-11 NOTE — Telephone Encounter (Signed)
Pt. Reports all night he had "heart rate 100-101.Not really palpitations." Denies any chest pain or shortness of breath. "It kept me awake." Appointment made.  Reason for Disposition . Age > 60 years (Exception: brief heartbeat symptoms that went away and now feels well)  Answer Assessment - Initial Assessment Questions 1. DESCRIPTION: "Please describe your heart rate or heartbeat that you are having" (e.g., fast/slow, regular/irregular, skipped or extra beats, "palpitations")     100 2. ONSET: "When did it start?" (Minutes, hours or days)      Last night 3. DURATION: "How long does it last" (e.g., seconds, minutes, hours)     Hours 4. PATTERN "Does it come and go, or has it been constant since it started?"  "Does it get worse with exertion?"   "Are you feeling it now?"     Has stopped now - 70 5. TAP: "Using your hand, can you tap out what you are feeling on a chair or table in front of you, so that I can hear?" (Note: not all patients can do this)       No 6. HEART RATE: "Can you tell me your heart rate?" "How many beats in 15 seconds?"  (Note: not all patients can do this)       70 7. RECURRENT SYMPTOM: "Have you ever had this before?" If Yes, ask: "When was the last time?" and "What happened that time?"      Yes 8. CAUSE: "What do you think is causing the palpitations?"     Unsure 9. CARDIAC HISTORY: "Do you have any history of heart disease?" (e.g., heart attack, angina, bypass surgery, angioplasty, arrhythmia)      No 10. OTHER SYMPTOMS: "Do you have any other symptoms?" (e.g., dizziness, chest pain, sweating, difficulty breathing)       No 11. PREGNANCY: "Is there any chance you are pregnant?" "When was your last menstrual period?"       n/a  Protocols used: HEART RATE AND HEARTBEAT QUESTIONS-A-AH

## 2020-10-12 ENCOUNTER — Encounter: Payer: Self-pay | Admitting: Cardiology

## 2020-10-12 ENCOUNTER — Ambulatory Visit: Payer: Medicare HMO | Admitting: Cardiology

## 2020-10-12 VITALS — BP 140/86 | HR 65 | Ht 74.0 in | Wt 252.2 lb

## 2020-10-12 DIAGNOSIS — E7800 Pure hypercholesterolemia, unspecified: Secondary | ICD-10-CM | POA: Diagnosis not present

## 2020-10-12 DIAGNOSIS — R000 Tachycardia, unspecified: Secondary | ICD-10-CM | POA: Diagnosis not present

## 2020-10-12 DIAGNOSIS — I10 Essential (primary) hypertension: Secondary | ICD-10-CM | POA: Diagnosis not present

## 2020-10-12 NOTE — Progress Notes (Signed)
Cardiology Office Note:    Date:  10/12/2020   ID:  Onyx, Schirmer November 25, 1945, MRN [id number]  PCP:  Margo Common, PA-C  [facility] Cardiologist:  Kate Sable, MD  [place] Electrophysiologist:  None   Referring MD: Trinna Post, PA-C   Chief Complaint  Patient presents with  . New Patient (Initial Visit)    Ref by Carles Collet, PA-C for tachycardia. Patient c/o rapid heart beats during the night, two nights ago with some chest pressure. Medications reviewed by the patient verbally.      History of Present Illness:    Steven Miranda is a 75 y.o. male with a hx of hypertension, hyperlipidemia, diabetes who presents due to rapid heartbeats.  Patient states having episodes of rapid heart rates 2 days ago.  He was asleep, woke up from sleep due to elevated heart rates, checked his smart watch/phone which showed heart rates up to 101 bpm.  He states his heart rates have been more elevated than usual.  Typically running in the 90s.  He denies chest pain, palpitations.  Endorses having reflux, and also feeling more anxious of late.  Takes his blood pressure medications as prescribed, systolic usually in the 790W at home.  He denies palpitations apart from 2 days ago when he woke up from sleep.  Has not had any further episodes, denies previous episodes.  Denies dizziness, presyncope or syncope.  Past Medical History:  Diagnosis Date  . Actinic keratosis   . Arthritis    right thumb  . Broken wrist   . Diabetes mellitus without complication ([place])   . Dysuria   . Hyperlipidemia   . Hypertension   . Wears contact lenses     Past Surgical History:  Procedure Laterality Date  . COLONOSCOPY  2005  . COLONOSCOPY WITH PROPOFOL N/A 10/16/2016   Procedure: COLONOSCOPY WITH PROPOFOL;  Surgeon: Robert Bellow, MD;  Location: [facility] ENDOSCOPY;  Service: Endoscopy;  Laterality: N/A;  . ESOPHAGOGASTRODUODENOSCOPY (EGD) WITH PROPOFOL N/A 12/13/2019   Procedure:  ESOPHAGOGASTRODUODENOSCOPY (EGD) WITH BIOPSY;  Surgeon: Lucilla Lame, MD;  Location: [place];  Service: Endoscopy;  Laterality: N/A;  Diabetic - oral meds  . PLANTAR'S WART EXCISION    . STRABISMUS SURGERY Right     Current Medications: Current Meds  Medication Sig  . ACCU-CHEK SMARTVIEW test strip CHECK  FASTING  BLOOD  SUGAR EVERY MORNING AS INSTRUCTED  . amLODipine (NORVASC) 5 MG tablet Take 1 tablet (5 mg total) by mouth at bedtime.  Marland Kitchen atorvastatin (LIPITOR) 20 MG tablet Take 1 tablet (20 mg total) by mouth daily.  . Blood Glucose Monitoring Suppl (ACCU-CHEK NANO SMARTVIEW) w/Device KIT Use glucometer (Accu-Chek Nano Glucometer) to test fasting glucose daily.  . empagliflozin (JARDIANCE) 25 MG TABS tablet Take 25 mg by mouth daily before breakfast.  . Ferrous Sulfate (IRON SUPPLEMENT PO) Take 1 tablet by mouth daily.  . fluticasone (FLONASE) 50 MCG/ACT nasal spray Place 2 sprays into both nostrils daily.  Marland Kitchen glucose blood (TRUE METRIX BLOOD GLUCOSE TEST) test strip Use as instructed  . hyoscyamine (LEVSIN SL) 0.125 MG SL tablet Place 1 tablet (0.125 mg total) under the tongue every 6 (six) hours as needed.  . Lancets (ONETOUCH ULTRASOFT) lancets Use as instructed once a day for fasting glucose check.  . lidocaine (XYLOCAINE) 2 % solution Mix 5 ml with 5 ml of liquid antacid for acute indigetion pain and swallow. May repeat 2-3 times in a day if needed.  Marland Kitchen  metFORMIN (GLUCOPHAGE) 1000 MG tablet TAKE 1 TABLET TWICE DAILY WITH MEALS  . Misc Natural Products (OSTEO BI-FLEX ADV DOUBLE ST) CAPS Take 1 capsule by mouth daily.  . pantoprazole (PROTONIX) 40 MG tablet TAKE 1 TABLET EVERY DAY  . pioglitazone (ACTOS) 15 MG tablet TAKE 1 TABLET EVERY DAY  . Probiotic Product (PROBIOTIC FORMULA) CAPS Take by mouth daily.  . sucralfate (CARAFATE) 1 g tablet Take 1 tablet (1 g total) by mouth 4 (four) times daily -  with meals and at bedtime.     Allergies:   Patient has no known allergies.    Social History   Socioeconomic History  . Marital status: Married    Spouse name: Not on file  . Number of children: 3  . Years of education: Not on file  . Highest education level: Bachelor's degree (e.g., BA, AB, BS)  Occupational History  . Not on file  Tobacco Use  . Smoking status: Former Smoker    Types: Cigarettes    Quit date: 1978    Years since quitting: 44.0  . Smokeless tobacco: Never Used  . Tobacco comment: quit in 1978  Vaping Use  . Vaping Use: Never used  Substance and Sexual Activity  . Alcohol use: Yes    Alcohol/week: 6.0 standard drinks    Types: 6 Cans of beer per week    Comment: a couple beers on the weekends  . Drug use: No  . Sexual activity: Not on file  Other Topics Concern  . Not on file  Social History Narrative  . Not on file   Social Determinants of Health   Financial Resource Strain: Low Risk   . Difficulty of Paying Living Expenses: Not hard at all  Food Insecurity: No Food Insecurity  . Worried About Charity fundraiser in the Last Year: Never true  . Ran Out of Food in the Last Year: Never true  Transportation Needs: No Transportation Needs  . Lack of Transportation (Medical): No  . Lack of Transportation (Non-Medical): No  Physical Activity: Sufficiently Active  . Days of Exercise per Week: 3 days  . Minutes of Exercise per Session: 60 min  Stress: No Stress Concern Present  . Feeling of Stress : Not at all  Social Connections: Socially Integrated  . Frequency of Communication with Friends and Family: More than three times a week  . Frequency of Social Gatherings with Friends and Family: More than three times a week  . Attends Religious Services: More than 4 times per year  . Active Member of Clubs or Organizations: Yes  . Attends Archivist Meetings: More than 4 times per year  . Marital Status: Married     Family History: The patient's family history includes Alcohol abuse in his father; Cancer in his mother;  Diabetes in his paternal grandmother; Emphysema in his paternal grandfather; Heart disease in his maternal uncle; Rheumatic fever in his maternal uncle.  ROS:   Please see the history of present illness.     All other systems reviewed and are negative.  EKGs/Labs/Other Studies Reviewed:    The following studies were reviewed today:   EKG:  EKG is  ordered today.  The ekg ordered today demonstrates normal sinus rhythm, normal ECG  Recent Labs: 10/10/2020: ALT 20; BUN 12; Creatinine, Ser 1.08; Hemoglobin 15.6; Platelets 221; Potassium 4.6; Sodium 138; TSH 3.510  Recent Lipid Panel    Component Value Date/Time   CHOL 152 10/10/2020 0853   TRIG  65 10/10/2020 0853   HDL 65 10/10/2020 0853   CHOLHDL 2.4 04/20/2020 0847   LDLCALC 74 10/10/2020 0853     Risk Assessment/Calculations:      Physical Exam:    VS:  BP 140/86 (BP Location: Left Arm, Patient Position: Sitting, Cuff Size: Normal)   Pulse 65   Ht 6' 2"  (1.88 m)   Wt 252 lb 4 oz (114.4 kg)   SpO2 97%   BMI 32.39 kg/m     Wt Readings from Last 3 Encounters:  10/12/20 252 lb 4 oz (114.4 kg)  10/11/20 257 lb 1.8 oz (116.6 kg)  10/10/20 257 lb (116.6 kg)     GEN:  Well nourished, well developed in no acute distress HEENT: Normal NECK: No JVD; No carotid bruits LYMPHATICS: No lymphadenopathy CARDIAC: RRR, no murmurs, rubs, gallops RESPIRATORY:  Clear to auscultation without rales, wheezing or rhonchi  ABDOMEN: Soft, non-tender, non-distended MUSCULOSKELETAL:  No edema; No deformity  SKIN: Warm and dry NEUROLOGIC:  Alert and oriented x 3 PSYCHIATRIC:  Normal affect   ASSESSMENT:    1. Tachycardia   2. Primary hypertension   3. Pure hypercholesterolemia    PLAN:    In order of problems listed above:  1. Patient with 1 episode of elevated heart rates, up to 101 bpm.  Has no cardiac symptoms of presyncope, syncope, palpitations.  Recommend close monitoring for now.  If symptoms persist or become worse, may  consider cardiac monitor.  Heart rates of 101, likely sinus tachycardia, no significant arrhythmias. 2. BP elevated, typically controlled at home.  Continue BP meds as prescribed for now. 3. Hyperlipidemia, continue statin.  Follow-up in 3 months.       Medication Adjustments/Labs and Tests Ordered: Current medicines are reviewed at length with the patient today.  Concerns regarding medicines are outlined above.  Orders Placed This Encounter  Procedures  . EKG 12-Lead   No orders of the defined types were placed in this encounter.   Patient Instructions  Medication Instructions:  No medication changes today.  Please refer to your current medication list.  *If you need a refill on your cardiac medications before your next appointment, please call your pharmacy*   Lab Work: No lab work today.   Testing/Procedures: None ordered today.   Follow-Up: At [facility], you and your health needs are our priority.  As part of our continuing mission to provide you with exceptional heart care, we have created designated Provider Care Teams.  These Care Teams include your primary Cardiologist (physician) and Advanced Practice Providers (APPs -  Physician Assistants and Nurse Practitioners) who all work together to provide you with the care you need, when you need it.  We recommend signing up for the patient portal called "MyChart".  Sign up information is provided on this After Visit Summary.  MyChart is used to connect with patients for Virtual Visits (Telemedicine).  Patients are able to view lab/test results, encounter notes, upcoming appointments, etc.  Non-urgent messages can be sent to your provider as well.   To learn more about what you can do with MyChart, go to [email or web address].    Your next appointment:   3 month(s)  The format for your next appointment:   In Person  Provider:   You may see Kate Sable, MD or one of the following Advanced Practice Providers  on your designated Care Team:    Murray Hodgkins, NP  Christell Faith, PA-C  Marrianne Mood, PA-C  Cadence [place], [place]  Laurann Montana, NP      Signed, Kate Sable, MD  10/12/2020 1:16 PM    [facility]

## 2020-10-12 NOTE — Patient Instructions (Signed)
Medication Instructions:  No medication changes today.  Please refer to your current medication list.  *If you need a refill on your cardiac medications before your next appointment, please call your pharmacy*   Lab Work: No lab work today.   Testing/Procedures: None ordered today.   Follow-Up: At [facility], you and your health needs are our priority.  As part of our continuing mission to provide you with exceptional heart care, we have created designated Provider Care Teams.  These Care Teams include your primary Cardiologist (physician) and Advanced Practice Providers (APPs -  Physician Assistants and Nurse Practitioners) who all work together to provide you with the care you need, when you need it.  We recommend signing up for the patient portal called "MyChart".  Sign up information is provided on this After Visit Summary.  MyChart is used to connect with patients for Virtual Visits (Telemedicine).  Patients are able to view lab/test results, encounter notes, upcoming appointments, etc.  Non-urgent messages can be sent to your provider as well.   To learn more about what you can do with MyChart, go to [email or web address].    Your next appointment:   3 month(s)  The format for your next appointment:   In Person  Provider:   You may see Kate Sable, MD or one of the following Advanced Practice Providers on your designated Care Team:    Murray Hodgkins, NP  Christell Faith, PA-C  Marrianne Mood, PA-C  Cadence [place], [place]  Laurann Montana, NP

## 2020-10-27 ENCOUNTER — Other Ambulatory Visit: Payer: Self-pay

## 2020-10-27 DIAGNOSIS — R1013 Epigastric pain: Secondary | ICD-10-CM

## 2020-10-27 MED ORDER — PANTOPRAZOLE SODIUM 40 MG PO TBEC: 40.0000 mg | DELAYED_RELEASE_TABLET | Freq: Every day | ORAL | 1 refills | Status: DC

## 2020-11-30 ENCOUNTER — Other Ambulatory Visit: Payer: Self-pay | Admitting: Family Medicine

## 2020-11-30 DIAGNOSIS — E119 Type 2 diabetes mellitus without complications: Secondary | ICD-10-CM

## 2020-11-30 NOTE — Telephone Encounter (Signed)
Requested medication (s) are due for refill today: Yes  Requested medication (s) are on the active medication list: Yes  Last refill:  11/12/19  Future visit scheduled: No  Notes to clinic:  Unable to refill per protocol, Rx expired.      Requested Prescriptions  Pending Prescriptions Disp Refills   JARDIANCE 25 MG TABS tablet [Pharmacy Med Name: JARDIANCE 25 MG Tablet] 90 tablet 3    Sig: TAKE 1 TABLET EVERY [place]      Endocrinology:  Diabetes - SGLT2 Inhibitors Failed - 11/30/2020  9:24 AM      Failed - LDL in normal range and within 360 days    LDL Chol Calc (NIH)  Date Value Ref Range Status  10/10/2020 74 0 - 99 mg/dL Final          Passed - Cr in normal range and within 360 days    Creatinine, Ser  Date Value Ref Range Status  10/10/2020 1.08 0.76 - 1.27 mg/dL Final   Creatinine, POC  Date Value Ref Range Status  08/20/2019 1.14 mg/dL Final          Passed - HBA1C is between 0 and 7.9 and within 180 days    HbA1c, POC (prediabetic range)  Date Value Ref Range Status  08/20/2019 7.9 (A) 5.7 - 6.4 % Corrected   HbA1c, POC (controlled diabetic range)  Date Value Ref Range Status  08/20/2019 7.0 0.0 - 7.0 % Corrected   HbA1c POC (<> result, manual entry)  Date Value Ref Range Status  08/20/2019 7.9 4.0 - 5.6 % Final   Hgb A1c MFr Bld  Date Value Ref Range Status  10/10/2020 6.8 (H) 4.8 - 5.6 % Final    Comment:             Prediabetes: 5.7 - 6.4          Diabetes: >6.4          Glycemic control for adults with diabetes: <7.0           Passed - eGFR in normal range and within 360 days    GFR calc Af Amer  Date Value Ref Range Status  10/10/2020 78 >59 mL/min/1.73 Final    Comment:    **In accordance with recommendations from the NKF-ASN Task force,**   [facility] is in the process of updating its eGFR calculation to the   2021 CKD-EPI creatinine equation that estimates kidney function   without a race variable.    GFR calc non Af  Amer  Date Value Ref Range Status  10/10/2020 68 >59 mL/min/1.73 Final          Passed - Valid encounter within last 6 months    Recent Outpatient Visits           1 month ago Elevated heart rate with elevated blood pressure and diagnosis of hypertension   [facility], Adriana M, PA-C   1 month ago Essential (primary) hypertension   [employer] [facility], Vickki Muff, PA-C   7 months ago Type 2 diabetes mellitus without complication, without long-term current use of insulin [facility])   [place], [place], [place]   11 months ago Sebaceous cyst   [employer] [facility], Vickki Muff, PA-C   1 year ago Encounter for [employer] annual wellness exam   [employer], Driscilla Grammes       Future Appointments  In 1 month [place], Aaron Edelman, MD [facility]

## 2020-12-04 ENCOUNTER — Telehealth: Payer: Self-pay

## 2020-12-04 DIAGNOSIS — E782 Mixed hyperlipidemia: Secondary | ICD-10-CM

## 2020-12-05 NOTE — Telephone Encounter (Signed)
Oh my goodness.  I do not.  They will send another request today if no one has responded.  If not I can call tomorrow.  I am sorry.

## 2020-12-05 NOTE — Telephone Encounter (Signed)
Farris Has do you remember what medicine he needed refilled?  Thanks,   -Mickel Baas

## 2020-12-06 MED ORDER — ATORVASTATIN CALCIUM 20 MG PO TABS: 20.0000 mg | ORAL_TABLET | Freq: Every day | ORAL | 1 refills | Status: DC

## 2020-12-06 NOTE — Telephone Encounter (Signed)
[facility] faxed refill request for the following medications:  atorvastatin (LIPITOR) 20 MG tablet  Please advise.

## 2021-01-02 DIAGNOSIS — Z0100 Encounter for examination of eyes and vision without abnormal findings: Secondary | ICD-10-CM | POA: Diagnosis not present

## 2021-01-11 ENCOUNTER — Ambulatory Visit: Payer: Medicare HMO | Admitting: Cardiology

## 2021-01-11 ENCOUNTER — Encounter: Payer: Self-pay | Admitting: Cardiology

## 2021-01-11 ENCOUNTER — Other Ambulatory Visit: Payer: Self-pay

## 2021-01-11 ENCOUNTER — Other Ambulatory Visit: Payer: Self-pay | Admitting: Family Medicine

## 2021-01-11 VITALS — BP 128/80 | HR 62 | Ht 74.0 in | Wt 254.0 lb

## 2021-01-11 DIAGNOSIS — I10 Essential (primary) hypertension: Secondary | ICD-10-CM

## 2021-01-11 DIAGNOSIS — E119 Type 2 diabetes mellitus without complications: Secondary | ICD-10-CM

## 2021-01-11 DIAGNOSIS — R000 Tachycardia, unspecified: Secondary | ICD-10-CM | POA: Diagnosis not present

## 2021-01-11 DIAGNOSIS — E7800 Pure hypercholesterolemia, unspecified: Secondary | ICD-10-CM | POA: Diagnosis not present

## 2021-01-11 NOTE — Patient Instructions (Signed)

## 2021-01-11 NOTE — Progress Notes (Signed)
Cardiology Office Note:    Date:  01/11/2021   ID:  Steven, Miranda 08-17-1946, MRN [id number]  PCP:  Margo Common, PA-C  [facility] Cardiologist:  Kate Sable, MD  [place] Electrophysiologist:  None   Referring MD: Margo Common, PA-C   Chief Complaint  Patient presents with  . Other    3 month folow up. Meds reviewed verbally with patient.     History of Present Illness:    Steven Miranda is a 75 y.o. male with a hx of hypertension, hyperlipidemia, diabetes who presents for follow-up.  Patient previously seen due to rapid heartbeats with one episode of heart rate measuring 101 bpm.  His heart rates on his smart watch with averaging 90s.  His symptoms were not deemed due to any significant arrhythmias as he denied any other cardiac symptoms.  Patient also stated being anxious at the time.  Currently denies any further symptoms of rapid heartbeats, blood pressure also has been normal.  Has no concerns at this time.   Past Medical History:  Diagnosis Date  . Actinic keratosis   . Arthritis    right thumb  . Broken wrist   . Diabetes mellitus without complication ([place])   . Dysuria   . Hyperlipidemia   . Hypertension   . Wears contact lenses     Past Surgical History:  Procedure Laterality Date  . COLONOSCOPY  2005  . COLONOSCOPY WITH PROPOFOL N/A 10/16/2016   Procedure: COLONOSCOPY WITH PROPOFOL;  Surgeon: Robert Bellow, MD;  Location: [facility] ENDOSCOPY;  Service: Endoscopy;  Laterality: N/A;  . ESOPHAGOGASTRODUODENOSCOPY (EGD) WITH PROPOFOL N/A 12/13/2019   Procedure: ESOPHAGOGASTRODUODENOSCOPY (EGD) WITH BIOPSY;  Surgeon: Lucilla Lame, MD;  Location: [facility];  Service: Endoscopy;  Laterality: N/A;  Diabetic - oral meds  . PLANTAR'S WART EXCISION    . STRABISMUS SURGERY Right     Current Medications: Current Meds  Medication Sig  . ACCU-CHEK SMARTVIEW test strip CHECK  FASTING  BLOOD  SUGAR EVERY MORNING AS INSTRUCTED  .  amLODipine (NORVASC) 5 MG tablet Take 1 tablet (5 mg total) by mouth at bedtime.  Marland Kitchen atorvastatin (LIPITOR) 20 MG tablet Take 1 tablet (20 mg total) by mouth daily.  . Blood Glucose Monitoring Suppl (ACCU-CHEK NANO SMARTVIEW) w/Device KIT Use glucometer (Accu-Chek Nano Glucometer) to test fasting glucose daily.  . Ferrous Sulfate (IRON SUPPLEMENT PO) Take 1 tablet by mouth daily.  . fluticasone (FLONASE) 50 MCG/ACT nasal spray Place 2 sprays into both nostrils daily.  Marland Kitchen glucose blood (TRUE METRIX BLOOD GLUCOSE TEST) test strip Use as instructed  . hyoscyamine (LEVSIN SL) 0.125 MG SL tablet Place 1 tablet (0.125 mg total) under the tongue every 6 (six) hours as needed.  Marland Kitchen JARDIANCE 25 MG TABS tablet TAKE 1 TABLET EVERY DAY BEFORE BREAKFAST  . Lancets (ONETOUCH ULTRASOFT) lancets Use as instructed once a day for fasting glucose check.  . lidocaine (XYLOCAINE) 2 % solution Mix 5 ml with 5 ml of liquid antacid for acute indigetion pain and swallow. May repeat 2-3 times in a day if needed.  . metFORMIN (GLUCOPHAGE) 1000 MG tablet TAKE 1 TABLET TWICE DAILY WITH MEALS  . Misc Natural Products (OSTEO BI-FLEX ADV DOUBLE ST) CAPS Take 1 capsule by mouth daily.  . pantoprazole (PROTONIX) 40 MG tablet Take 1 tablet (40 mg total) by mouth daily.  . pioglitazone (ACTOS) 15 MG tablet TAKE 1 TABLET EVERY DAY  . Probiotic Product (PROBIOTIC FORMULA) CAPS Take  by mouth daily.  . sucralfate (CARAFATE) 1 g tablet Take 1 tablet (1 g total) by mouth 4 (four) times daily -  with meals and at bedtime.     Allergies:   Patient has no known allergies.   Social History   Socioeconomic History  . Marital status: Married    Spouse name: Not on file  . Number of children: 3  . Years of education: Not on file  . Highest education level: Bachelor's degree (e.g., BA, AB, BS)  Occupational History  . Not on file  Tobacco Use  . Smoking status: Former Smoker    Types: Cigarettes    Quit date: 1978    Years since  quitting: 44.3  . Smokeless tobacco: Never Used  . Tobacco comment: quit in 1978  Vaping Use  . Vaping Use: Never used  Substance and Sexual Activity  . Alcohol use: Yes    Alcohol/week: 6.0 standard drinks    Types: 6 Cans of beer per week    Comment: a couple beers on the weekends  . Drug use: No  . Sexual activity: Not on file  Other Topics Concern  . Not on file  Social History Narrative  . Not on file   Social Determinants of Health   Financial Resource Strain: Low Risk   . Difficulty of Paying Living Expenses: Not hard at all  Food Insecurity: No Food Insecurity  . Worried About Charity fundraiser in the Last Year: Never true  . Ran Out of Food in the Last Year: Never true  Transportation Needs: No Transportation Needs  . Lack of Transportation (Medical): No  . Lack of Transportation (Non-Medical): No  Physical Activity: Sufficiently Active  . Days of Exercise per Week: 3 days  . Minutes of Exercise per Session: 60 min  Stress: No Stress Concern Present  . Feeling of Stress : Not at all  Social Connections: Socially Integrated  . Frequency of Communication with Friends and Family: More than three times a week  . Frequency of Social Gatherings with Friends and Family: More than three times a week  . Attends Religious Services: More than 4 times per year  . Active Member of Clubs or Organizations: Yes  . Attends Archivist Meetings: More than 4 times per year  . Marital Status: Married     Family History: The patient's family history includes Alcohol abuse in his father; Cancer in his mother; Diabetes in his paternal grandmother; Emphysema in his paternal grandfather; Heart disease in his maternal uncle; Rheumatic fever in his maternal uncle.  ROS:   Please see the history of present illness.     All other systems reviewed and are negative.  EKGs/Labs/Other Studies Reviewed:    The following studies were reviewed today:   EKG:  EKG is  ordered  today.  The ekg ordered today demonstrates normal sinus rhythm, first-degree AV block, otherwise normal ECG  Recent Labs: 10/10/2020: ALT 20; BUN 12; Creatinine, Ser 1.08; Hemoglobin 15.6; Platelets 221; Potassium 4.6; Sodium 138; TSH 3.510  Recent Lipid Panel    Component Value Date/Time   CHOL 152 10/10/2020 0853   TRIG 65 10/10/2020 0853   HDL 65 10/10/2020 0853   CHOLHDL 2.4 04/20/2020 0847   LDLCALC 74 10/10/2020 0853     Risk Assessment/Calculations:      Physical Exam:    VS:  BP 128/80 (BP Location: Left Arm, Patient Position: Sitting, Cuff Size: Normal)   Pulse 62  Ht 6' 2"  (1.88 m)   Wt 254 lb (115.2 kg)   SpO2 95%   BMI 32.61 kg/m     Wt Readings from Last 3 Encounters:  01/11/21 254 lb (115.2 kg)  10/12/20 252 lb 4 oz (114.4 kg)  10/11/20 257 lb 1.8 oz (116.6 kg)     GEN:  Well nourished, well developed in no acute distress HEENT: Normal NECK: No JVD; No carotid bruits LYMPHATICS: No lymphadenopathy CARDIAC: RRR, no murmurs, rubs, gallops RESPIRATORY:  Clear to auscultation without rales, wheezing or rhonchi  ABDOMEN: Soft, non-tender, non-distended MUSCULOSKELETAL:  No edema; No deformity  SKIN: Warm and dry NEUROLOGIC:  Alert and oriented x 3 PSYCHIATRIC:  Normal affect   ASSESSMENT:    1. Primary hypertension   2. Pure hypercholesterolemia   3. Tachycardia    PLAN:    In order of problems listed above:  1. BP controlled, continue amlodipine 2. Hyperlipidemia, continue Lipitor. 3. Previous history of tachycardia, symptoms resolved, EKG showing normal sinus rhythm heart rate 62.  Patient reassured.  Follow-up as needed       Medication Adjustments/Labs and Tests Ordered: Current medicines are reviewed at length with the patient today.  Concerns regarding medicines are outlined above.  Orders Placed This Encounter  Procedures  . EKG 12-Lead   No orders of the defined types were placed in this encounter.   Patient Instructions   Medication Instructions:  Your physician recommends that you continue on your current medications as directed. Please refer to the Current Medication list given to you today.  *If you need a refill on your cardiac medications before your next appointment, please call your pharmacy*   Lab Work: None ordered If you have labs (blood work) drawn today and your tests are completely normal, you will receive your results only by: Marland Kitchen MyChart Message (if you have MyChart) OR . A paper copy in the mail If you have any lab test that is abnormal or we need to change your treatment, we will call you to review the results.   Testing/Procedures: None ordered   Follow-Up: At [facility], you and your health needs are our priority.  As part of our continuing mission to provide you with exceptional heart care, we have created designated Provider Care Teams.  These Care Teams include your primary Cardiologist (physician) and Advanced Practice Providers (APPs -  Physician Assistants and Nurse Practitioners) who all work together to provide you with the care you need, when you need it.  We recommend signing up for the patient portal called "MyChart".  Sign up information is provided on this After Visit Summary.  MyChart is used to connect with patients for Virtual Visits (Telemedicine).  Patients are able to view lab/test results, encounter notes, upcoming appointments, etc.  Non-urgent messages can be sent to your provider as well.   To learn more about what you can do with MyChart, go to [email or web address].    Your next appointment:    Follow up as needed  The format for your next appointment:   In Person  Provider:   Kate Sable, MD   Other Instructions     Signed, Kate Sable, MD  01/11/2021 12:14 PM    [place]

## 2021-02-12 ENCOUNTER — Emergency Department: Payer: Medicare HMO

## 2021-02-12 ENCOUNTER — Other Ambulatory Visit: Payer: Self-pay

## 2021-02-12 ENCOUNTER — Emergency Department
Admission: EM | Admit: 2021-02-12 | Discharge: 2021-02-12 | Disposition: A | Discharge status: Home / Self Care | Payer: Medicare HMO | Attending: Emergency Medicine | Admitting: Emergency Medicine

## 2021-02-12 DIAGNOSIS — Z043 Encounter for examination and observation following other accident: Secondary | ICD-10-CM | POA: Diagnosis not present

## 2021-02-12 DIAGNOSIS — E119 Type 2 diabetes mellitus without complications: Secondary | ICD-10-CM | POA: Insufficient documentation

## 2021-02-12 DIAGNOSIS — Z7984 Long term (current) use of oral hypoglycemic drugs: Secondary | ICD-10-CM | POA: Diagnosis not present

## 2021-02-12 DIAGNOSIS — R519 Headache, unspecified: Secondary | ICD-10-CM | POA: Diagnosis not present

## 2021-02-12 DIAGNOSIS — W228XXA Striking against or struck by other objects, initial encounter: Secondary | ICD-10-CM | POA: Diagnosis not present

## 2021-02-12 DIAGNOSIS — S8002XA Contusion of left knee, initial encounter: Secondary | ICD-10-CM | POA: Insufficient documentation

## 2021-02-12 DIAGNOSIS — S0990XA Unspecified injury of head, initial encounter: Secondary | ICD-10-CM | POA: Diagnosis not present

## 2021-02-12 DIAGNOSIS — S199XXA Unspecified injury of neck, initial encounter: Secondary | ICD-10-CM | POA: Diagnosis not present

## 2021-02-12 DIAGNOSIS — S20211A Contusion of right front wall of thorax, initial encounter: Secondary | ICD-10-CM | POA: Diagnosis not present

## 2021-02-12 DIAGNOSIS — Z87891 Personal history of nicotine dependence: Secondary | ICD-10-CM | POA: Diagnosis not present

## 2021-02-12 DIAGNOSIS — W19XXXA Unspecified fall, initial encounter: Secondary | ICD-10-CM

## 2021-02-12 DIAGNOSIS — Z79899 Other long term (current) drug therapy: Secondary | ICD-10-CM | POA: Insufficient documentation

## 2021-02-12 DIAGNOSIS — M47812 Spondylosis without myelopathy or radiculopathy, cervical region: Secondary | ICD-10-CM | POA: Diagnosis not present

## 2021-02-12 DIAGNOSIS — I10 Essential (primary) hypertension: Secondary | ICD-10-CM | POA: Insufficient documentation

## 2021-02-12 DIAGNOSIS — I6529 Occlusion and stenosis of unspecified carotid artery: Secondary | ICD-10-CM | POA: Diagnosis not present

## 2021-02-12 DIAGNOSIS — S8001XA Contusion of right knee, initial encounter: Secondary | ICD-10-CM | POA: Diagnosis not present

## 2021-02-12 DIAGNOSIS — S8992XA Unspecified injury of left lower leg, initial encounter: Secondary | ICD-10-CM | POA: Diagnosis present

## 2021-02-12 MED ORDER — NAPROXEN 500 MG PO TABS
500.0000 mg | ORAL_TABLET | Freq: Once | ORAL | Status: AC
  Administered 2021-02-12: 500 mg via ORAL
  Filled 2021-02-12: qty 1

## 2021-02-12 NOTE — ED Provider Notes (Signed)
[facility] Emergency Department Provider Note  ____________________________________________   Event Date/Time   First MD Initiated Contact with Patient 02/12/21 1830     (approximate)  I have reviewed the triage vital signs and the nursing notes.   HISTORY  Chief Complaint Fall   HPI Steven Miranda is a 75 y.o. male who has no history of diabetes, HTN, HDL, arthritis and right eye amblyopia who presents for assessment of headache and some right-sided chest discomfort after he states he tripped and fell yesterday striking his head and right side of his chest.  He also states he hit the left knee on the floor.  He was admitted he tripped and did not pass out or lose consciousness.  He is not on any blood thinners.  He did take some Tylenol supplement but still with persistent headache and was worried about possible brain injury.  He denies any other acute sick symptoms including acute vision changes, back pain, chest discomfort other than the right where he hit his ribs, abdominal pain, vomiting, diarrhea or dysuria, rash or pain in any other extremity other than the left knee.  Has been able to ambulate without any difficulties.         Past Medical History:  Diagnosis Date  . Actinic keratosis   . Arthritis    right thumb  . Broken wrist   . Diabetes mellitus without complication ([place])   . Dysuria   . Hyperlipidemia   . Hypertension   . Wears contact lenses     Patient Active Problem List   Diagnosis Date Noted  . Indigestion   . Other specified diseases of esophagus   . Carpal tunnel syndrome 02/12/2018  . Shoulder stiffness 11/25/2017  . Shoulder joint pain 06/06/2017  . Acid indigestion 02/02/2015  . Essential (primary) hypertension 02/02/2015  . Injury of tendon of rotator cuff 02/02/2015  . Diabetes mellitus, type 2 ([place]) 02/02/2015  . HLD (hyperlipidemia) 05/19/2007    Past Surgical History:  Procedure Laterality Date  .  COLONOSCOPY  2005  . COLONOSCOPY WITH PROPOFOL N/A 10/16/2016   Procedure: COLONOSCOPY WITH PROPOFOL;  Surgeon: Robert Bellow, MD;  Location: [facility] ENDOSCOPY;  Service: Endoscopy;  Laterality: N/A;  . ESOPHAGOGASTRODUODENOSCOPY (EGD) WITH PROPOFOL N/A 12/13/2019   Procedure: ESOPHAGOGASTRODUODENOSCOPY (EGD) WITH BIOPSY;  Surgeon: Lucilla Lame, MD;  Location: [facility];  Service: Endoscopy;  Laterality: N/A;  Diabetic - oral meds  . PLANTAR'S WART EXCISION    . STRABISMUS SURGERY Right     Prior to Admission medications   Medication Sig Start Date End Date Taking? Authorizing Provider  ACCU-CHEK SMARTVIEW test strip CHECK  FASTING  BLOOD  SUGAR EVERY MORNING AS INSTRUCTED 10/29/16   Chrismon, Vickki Muff, PA-C  amLODipine (NORVASC) 5 MG tablet Take 1 tablet (5 mg total) by mouth at bedtime. 09/19/20   Chrismon, Vickki Muff, PA-C  atorvastatin (LIPITOR) 20 MG tablet Take 1 tablet (20 mg total) by mouth daily. 12/06/20   Mar Daring, PA-C  Blood Glucose Monitoring Suppl (ACCU-CHEK NANO SMARTVIEW) w/Device KIT Use glucometer (Accu-Chek Nano Glucometer) to test fasting glucose daily. 12/31/16   Chrismon, Vickki Muff, PA-C  Ferrous Sulfate (IRON SUPPLEMENT PO) Take 1 tablet by mouth daily.    [provider]  fluticasone (FLONASE) 50 MCG/ACT nasal spray Place 2 sprays into both nostrils daily. 07/09/17   Trinna Post, PA-C  glucose blood (TRUE METRIX BLOOD GLUCOSE TEST) test strip Use as instructed 06/14/19  Chrismon, Vickki Muff, PA-C  hyoscyamine (LEVSIN SL) 0.125 MG SL tablet Place 1 tablet (0.125 mg total) under the tongue every 6 (six) hours as needed. 02/17/20   Lucilla Lame, MD  JARDIANCE 25 MG TABS tablet TAKE 1 TABLET EVERY DAY BEFORE BREAKFAST 11/30/20   Jerrol Banana., MD  Lancets [facility] ULTRASOFT) lancets Use as instructed once a day for fasting glucose check. 03/30/15   Chrismon, Vickki Muff, PA-C  lidocaine (XYLOCAINE) 2 % solution Mix 5 ml with 5 ml of liquid  antacid for acute indigetion pain and swallow. May repeat 2-3 times in a day if needed. 08/10/19   Chrismon, Vickki Muff, PA-C  metFORMIN (GLUCOPHAGE) 1000 MG tablet TAKE 1 TABLET TWICE DAILY WITH MEALS 07/26/20   Chrismon, Vickki Muff, PA-C  Misc Natural Products (OSTEO BI-FLEX ADV DOUBLE ST) CAPS Take 1 capsule by mouth daily.    [provider]  pantoprazole (PROTONIX) 40 MG tablet Take 1 tablet (40 mg total) by mouth daily. 10/27/20   Lucilla Lame, MD  pioglitazone (ACTOS) 15 MG tablet TAKE 1 TABLET EVERY DAY 01/11/21   Chrismon, Vickki Muff, PA-C  Probiotic Product (PROBIOTIC FORMULA) CAPS Take by mouth daily.    [provider]  sucralfate (CARAFATE) 1 g tablet Take 1 tablet (1 g total) by mouth 4 (four) times daily -  with meals and at bedtime. 02/17/20   Lucilla Lame, MD    Allergies Patient has no known allergies.  Family History  Problem Relation Age of Onset  . Cancer Mother   . Alcohol abuse Father   . Heart disease Maternal Uncle   . Rheumatic fever Maternal Uncle   . Diabetes Paternal Grandmother   . Emphysema Paternal Grandfather     Social History Social History   Tobacco Use  . Smoking status: Former Smoker    Types: Cigarettes    Quit date: 1978    Years since quitting: 44.4  . Smokeless tobacco: Never Used  . Tobacco comment: quit in 1978  Vaping Use  . Vaping Use: Never used  Substance Use Topics  . Alcohol use: Yes    Alcohol/week: 6.0 standard drinks    Types: 6 Cans of beer per week    Comment: a couple beers on the weekends  . Drug use: No    Review of Systems  Review of Systems  Constitutional: Negative for chills and fever.  HENT: Negative for sore throat.   Eyes: Negative for pain.  Respiratory: Negative for cough and stridor.   Cardiovascular: Positive for chest pain ( R chest where he hit when he fell ).  Gastrointestinal: Negative for vomiting.  Musculoskeletal: Positive for joint pain ( L knee).  Skin: Negative for rash.   Neurological: Positive for headaches. Negative for seizures and loss of consciousness.  Psychiatric/Behavioral: Negative for suicidal ideas.  All other systems reviewed and are negative.     ____________________________________________   PHYSICAL EXAM:  VITAL SIGNS: ED Triage Vitals  Enc Vitals Group     BP      Pulse      Resp      Temp      Temp src      SpO2      Weight      Height      Head Circumference      Peak Flow      Pain Score      Pain Loc      Pain Edu?  Excl. in [place]?    Vitals:   02/12/21 1837 02/12/21 1900  BP:  (!) 143/78  Pulse:  71  Resp:    Temp: 98.2 F (36.8 C)   SpO2:  95%   Physical Exam Vitals and nursing note reviewed.  Constitutional:      Appearance: He is well-developed.  HENT:     Head: Normocephalic and atraumatic.     Right Ear: External ear normal.     Left Ear: External ear normal.     Nose: Nose normal.  Eyes:     Conjunctiva/sclera: Conjunctivae normal.  Cardiovascular:     Rate and Rhythm: Normal rate and regular rhythm.     Heart sounds: No murmur heard.   Pulmonary:     Effort: Pulmonary effort is normal. No respiratory distress.     Breath sounds: Normal breath sounds.  Abdominal:     Palpations: Abdomen is soft.     Tenderness: There is no abdominal tenderness.  Musculoskeletal:     Cervical back: Neck supple.  Skin:    General: Skin is warm and dry.     Capillary Refill: Capillary refill takes less than 2 seconds.  Neurological:     Mental Status: He is alert and oriented to person, place, and time.  Psychiatric:        Mood and Affect: Mood normal.     2+ radial pulse.  Sensation intact light touch all extremities.  Patient has full strength and range of motion throughout his bilateral upper and lower extremities.  There is little erythema and edema over the left patella which he otherwise has full range of motion at.  No deformity.  Cranial nerves II through XII grossly intact with the exception of  the right which is abducted which patient states is normal for him..  No obvious trauma to the face scalp head or neck. ____________________________________________   LABS (all labs ordered are listed, but only abnormal results are displayed)  Labs Reviewed - No data to display ____________________________________________  EKG  ____________________________________________  RADIOLOGY  ED MD interpretation: CT head and C-spine showed no evidence of acute intracranial or cervical injury.  Chest x-ray shows no evidence of rib fracture or pneumothorax.  Official radiology report(s): DG Chest 2 View  Result Date: 02/12/2021 CLINICAL DATA:  Status post fall. EXAM: CHEST - 2 VIEW COMPARISON:  07/30/2019. FINDINGS: The heart size and mediastinal contours are within normal limits. Both lungs are clear. The visualized skeletal structures are unremarkable. IMPRESSION: No active cardiopulmonary disease. Electronically Signed   By: Kerby Moors M.D.   On: 02/12/2021 19:29   CT Head Wo Contrast  Result Date: 02/12/2021 CLINICAL DATA:  Head trauma fall EXAM: CT HEAD WITHOUT CONTRAST CT CERVICAL SPINE WITHOUT CONTRAST TECHNIQUE: Multidetector CT imaging of the head and cervical spine was performed following the standard protocol without intravenous contrast. Multiplanar CT image reconstructions of the cervical spine were also generated. COMPARISON:  CT brain 02/07/2019 FINDINGS: CT HEAD FINDINGS Brain: No acute territorial infarction, hemorrhage or intracranial mass is visualized. Prominent anterior extra-axial CSF densities without change, either due to atrophy or small chronic subdural fluid collections. Stable ventricle size. Vascular: No hyperdense vessels. Scattered carotid vascular calcification Skull: Normal. Negative for fracture or focal lesion. Sinuses/Orbits: No acute finding. Other: None CT CERVICAL SPINE FINDINGS Alignment: Reversal of cervical lordosis. Facet alignment is maintained Skull base  and vertebrae: No acute fracture. No primary bone lesion or focal pathologic process. Soft tissues and spinal canal: No  prevertebral fluid or swelling. No visible canal hematoma. Disc levels: Mild disc space narrowing and degenerative change C6-C7. degenerative changes at the C1-C2 articulation Upper chest: Negative. Other: None IMPRESSION: 1. No CT evidence for acute intracranial abnormality. 2. Reversal of cervical lordosis.  No acute osseous abnormality Electronically Signed   By: Donavan Foil M.D.   On: 02/12/2021 19:58   CT Cervical Spine Wo Contrast  Result Date: 02/12/2021 CLINICAL DATA:  Head trauma fall EXAM: CT HEAD WITHOUT CONTRAST CT CERVICAL SPINE WITHOUT CONTRAST TECHNIQUE: Multidetector CT imaging of the head and cervical spine was performed following the standard protocol without intravenous contrast. Multiplanar CT image reconstructions of the cervical spine were also generated. COMPARISON:  CT brain 02/07/2019 FINDINGS: CT HEAD FINDINGS Brain: No acute territorial infarction, hemorrhage or intracranial mass is visualized. Prominent anterior extra-axial CSF densities without change, either due to atrophy or small chronic subdural fluid collections. Stable ventricle size. Vascular: No hyperdense vessels. Scattered carotid vascular calcification Skull: Normal. Negative for fracture or focal lesion. Sinuses/Orbits: No acute finding. Other: None CT CERVICAL SPINE FINDINGS Alignment: Reversal of cervical lordosis. Facet alignment is maintained Skull base and vertebrae: No acute fracture. No primary bone lesion or focal pathologic process. Soft tissues and spinal canal: No prevertebral fluid or swelling. No visible canal hematoma. Disc levels: Mild disc space narrowing and degenerative change C6-C7. degenerative changes at the C1-C2 articulation Upper chest: Negative. Other: None IMPRESSION: 1. No CT evidence for acute intracranial abnormality. 2. Reversal of cervical lordosis.  No acute osseous  abnormality Electronically Signed   By: Donavan Foil M.D.   On: 02/12/2021 19:58    ____________________________________________   PROCEDURES  Procedure(s) performed (including Critical Care):  Procedures   ____________________________________________   INITIAL IMPRESSION / ASSESSMENT AND PLAN / ED COURSE      Patient presents with above to history exam for progressive headache over the course of today after a fall yesterday with patient stating he struck his head.  He is also complaining of pain in his right chest and some soreness over his left knee.  However he states he has not had significant pain in his left knee and has been able to walk without difficulties.  He also denies any left-sided chest discomfort shortness of breath cough or any other associated recent sick symptoms.  On arrival he is little hypertensive with a BP of 166/94 otherwise stable vital signs on room air.  While there is no obvious trauma on exam and patient has symmetric strength and is no obvious focal deficits and appears neurovascularly intact given his age and headache will obtain CT head and C-spine.  Also will opbtain a chest x-ray to assess for possible rib fracture.  Left knee I think is likely contused given he has full strength range of motion has been able bear weight very low sufficient for significant fracture and will defer imaging at this time.  CT head and C-spine as well as chest x-ray unremarkable.  Impression is likely TBI i.e. concussion and contusion of the right chest and left knee.  Patient given some naproxen and Tylenol.  Discharged stable condition.  Strict return precautions advised and discussed.       ____________________________________________   FINAL CLINICAL IMPRESSION(S) / ED DIAGNOSES  Final diagnoses:  Fall, initial encounter  Nonintractable headache, unspecified chronicity pattern, unspecified headache type  Contusion of left knee, initial encounter  Contusion of  right chest wall, initial encounter    Medications  naproxen (NAPROSYN) tablet 500 mg (  has no administration in time range)     ED Discharge Orders    None       Note:  This document was prepared using Dragon voice recognition software and may include unintentional dictation errors.   Lucrezia Starch, MD 02/12/21 2003

## 2021-02-12 NOTE — ED Triage Notes (Signed)
First Nurse Note:  C/O fall yesterday. C/O right shoulder pain and headache.  AAOx3.  Skin warm and dry. NAD

## 2021-02-12 NOTE — ED Notes (Signed)
Pt taken to xray at this time.

## 2021-02-12 NOTE — ED Notes (Signed)
Pt states that he was walking at a department store yesterday with his grand kids and tripped where the carpet transitioned to hard floor, pt states that he landed mostly on his right side and head, pt states that his concern is that his head has had increased pain throughout the day. Pt states his right shoulder, right ribs, left knee, and his head hurts

## 2021-02-12 NOTE — ED Notes (Signed)
Reviewed discharge instructions, follow-up care with patient. Patient verbalized understanding of all information reviewed. Patient stable, with no distress noted at this time.    

## 2021-02-12 NOTE — ED Triage Notes (Signed)
Pt states he tripped and fell in [place] yesterday, c/o right shoulder, rib and head pain . Pt is ambulatory with a steady gait

## 2021-02-13 ENCOUNTER — Ambulatory Visit: Payer: Medicare HMO | Admitting: Dermatology

## 2021-02-13 DIAGNOSIS — L821 Other seborrheic keratosis: Secondary | ICD-10-CM | POA: Diagnosis not present

## 2021-02-13 DIAGNOSIS — L814 Other melanin hyperpigmentation: Secondary | ICD-10-CM

## 2021-02-13 DIAGNOSIS — L578 Other skin changes due to chronic exposure to nonionizing radiation: Secondary | ICD-10-CM

## 2021-02-13 DIAGNOSIS — Z1283 Encounter for screening for malignant neoplasm of skin: Secondary | ICD-10-CM | POA: Diagnosis not present

## 2021-02-13 DIAGNOSIS — D1800 Hemangioma unspecified site: Secondary | ICD-10-CM

## 2021-02-13 DIAGNOSIS — Z872 Personal history of diseases of the skin and subcutaneous tissue: Secondary | ICD-10-CM | POA: Diagnosis not present

## 2021-02-13 DIAGNOSIS — D2262 Melanocytic nevi of left upper limb, including shoulder: Secondary | ICD-10-CM | POA: Diagnosis not present

## 2021-02-13 DIAGNOSIS — D229 Melanocytic nevi, unspecified: Secondary | ICD-10-CM

## 2021-02-13 DIAGNOSIS — D225 Melanocytic nevi of trunk: Secondary | ICD-10-CM

## 2021-02-13 DIAGNOSIS — L219 Seborrheic dermatitis, unspecified: Secondary | ICD-10-CM

## 2021-02-13 DIAGNOSIS — Q825 Congenital non-neoplastic nevus: Secondary | ICD-10-CM

## 2021-02-13 NOTE — Patient Instructions (Signed)

## 2021-02-13 NOTE — Progress Notes (Signed)
   Follow-Up Visit   Subjective  Steven Miranda is a 75 y.o. male who presents for the following: UBSE (Hx AK's ).  The following portions of the chart were reviewed this encounter and updated as appropriate:      Review of Systems:  No other skin or systemic complaints except as noted in HPI or Assessment and Plan.  Objective  Well appearing patient in no apparent distress; mood and affect are within normal limits.  A full examination was performed including scalp, head, eyes, ears, nose, lips, neck, chest, axillae, abdomen, back, buttocks, bilateral upper extremities, bilateral lower extremities, hands, feet, fingers, toes, fingernails, and toenails. All findings within normal limits unless otherwise noted below.  Objective  Left Lower Back: 0.5 cm medium brown macule.   L upper arm: 2.0 cm brown plaque with hypertrichosis (congenital)   Objective  Temple/hairline: Pink patches with greasy scale.   Objective  Forehead: Clear.   Assessment & Plan  Nevus (2) L upper arm; Left Lower Back  Benign-appearing.  Observation.  Call clinic for new or changing lesions.  Recommend daily use of broad spectrum spf 30+ sunscreen to sun-exposed areas.   Congenital nevus - L upper arm   Seborrheic dermatitis Temple/hairline  Seborrheic Dermatitis  -  is a chronic persistent rash characterized by pinkness and scaling most commonly of the mid face but also can occur on the scalp (dandruff), ears; mid chest and mid back. It tends to be exacerbated by stress and cooler weather.  People who have neurologic disease may experience new onset or exacerbation of existing seborrheic dermatitis.  The condition is not curable but treatable and can be controlled.  Recommend H&S shampoo and OTC 1% HC cream QD PRN.   History of actinic keratosis Forehead  Clear. Observe for recurrence. Call clinic for new or changing lesions.  Recommend regular skin exams, daily broad-spectrum spf 30+ sunscreen  use, and photoprotection.     Congenital non-neoplastic nevus   Lentigines - Scattered tan macules - Due to sun exposure - Benign-appering, observe - Recommend daily broad spectrum sunscreen SPF 30+ to sun-exposed areas, reapply every 2 hours as needed. - Call for any changes  Seborrheic Keratoses - Stuck-on, waxy, tan-brown papules and/or plaques  - Benign-appearing - Discussed benign etiology and prognosis. - Observe - Call for any changes  Melanocytic Nevi - Tan-brown and/or pink-flesh-colored symmetric macules and papules - Benign appearing on exam today - Observation - Call clinic for new or changing moles - Recommend daily use of broad spectrum spf 30+ sunscreen to sun-exposed areas.   Hemangiomas - Red papules - Discussed benign nature - Observe - Call for any changes  Actinic Damage - Chronic condition, secondary to cumulative UV/sun exposure - diffuse scaly erythematous macules with underlying dyspigmentation - Recommend daily broad spectrum sunscreen SPF 30+ to sun-exposed areas, reapply every 2 hours as needed.  - Staying in the shade or wearing long sleeves, sun glasses (UVA+UVB protection) and wide brim hats (4-inch brim around the entire circumference of the hat) are also recommended for sun protection.  - Call for new or changing lesions.  Skin cancer screening performed today.  Return in about 1 year (around 02/13/2022) for UBSE.  Luther Redo, CMA, am acting as scribe for Brendolyn Patty, MD .  Documentation: I have reviewed the above documentation for accuracy and completeness, and I agree with the above.  Brendolyn Patty MD

## 2021-02-14 ENCOUNTER — Other Ambulatory Visit: Payer: Self-pay

## 2021-02-14 ENCOUNTER — Encounter: Payer: Self-pay | Admitting: Family Medicine

## 2021-02-14 DIAGNOSIS — E782 Mixed hyperlipidemia: Secondary | ICD-10-CM

## 2021-02-14 MED ORDER — ATORVASTATIN CALCIUM 20 MG PO TABS: 20.0000 mg | ORAL_TABLET | Freq: Every day | ORAL | 1 refills | Status: DC

## 2021-02-19 ENCOUNTER — Other Ambulatory Visit: Payer: Self-pay | Admitting: Physician Assistant

## 2021-02-19 DIAGNOSIS — E782 Mixed hyperlipidemia: Secondary | ICD-10-CM

## 2021-02-23 ENCOUNTER — Encounter: Payer: Self-pay | Admitting: Family Medicine

## 2021-02-23 MED ORDER — ATORVASTATIN CALCIUM 20 MG PO TABS: 20.0000 mg | ORAL_TABLET | Freq: Every day | ORAL | 1 refills | Status: DC

## 2021-04-10 DIAGNOSIS — H1045 Other chronic allergic conjunctivitis: Secondary | ICD-10-CM | POA: Diagnosis not present

## 2021-05-21 ENCOUNTER — Other Ambulatory Visit: Payer: Self-pay | Admitting: Gastroenterology

## 2021-05-21 ENCOUNTER — Other Ambulatory Visit: Payer: Self-pay | Admitting: Family Medicine

## 2021-05-21 DIAGNOSIS — E0800 Diabetes mellitus due to underlying condition with hyperosmolarity without nonketotic hyperglycemic-hyperosmolar coma (NKHHC): Secondary | ICD-10-CM

## 2021-05-21 DIAGNOSIS — R1013 Epigastric pain: Secondary | ICD-10-CM

## 2021-05-21 NOTE — Telephone Encounter (Signed)
Requested medications are due for refill today yes  Requested medications are on the active medication list yes  Last refill 4/14  Last visit 10/10/20, labs also 10/10/20  Future visit scheduled 07/23/21  Notes to clinic Failed protocol due to no valid visit within 6  months, does have upcoming appt in Oct.

## 2021-05-29 ENCOUNTER — Encounter: Payer: Self-pay | Admitting: Family Medicine

## 2021-07-11 ENCOUNTER — Other Ambulatory Visit: Payer: Self-pay

## 2021-07-11 DIAGNOSIS — E0800 Diabetes mellitus due to underlying condition with hyperosmolarity without nonketotic hyperglycemic-hyperosmolar coma (NKHHC): Secondary | ICD-10-CM

## 2021-07-11 MED ORDER — METFORMIN HCL 1000 MG PO TABS: 1000.0000 mg | ORAL_TABLET | Freq: Two times a day (BID) | ORAL | 5 refills | Status: DC

## 2021-07-19 ENCOUNTER — Encounter: Payer: Self-pay | Admitting: General Surgery

## 2021-07-25 DIAGNOSIS — H524 Presbyopia: Secondary | ICD-10-CM | POA: Diagnosis not present

## 2021-08-03 ENCOUNTER — Other Ambulatory Visit: Payer: Self-pay

## 2021-08-03 ENCOUNTER — Telehealth (INDEPENDENT_AMBULATORY_CARE_PROVIDER_SITE_OTHER): Payer: Medicare HMO | Admitting: Physician Assistant

## 2021-08-03 DIAGNOSIS — J069 Acute upper respiratory infection, unspecified: Secondary | ICD-10-CM | POA: Diagnosis not present

## 2021-08-03 DIAGNOSIS — J029 Acute pharyngitis, unspecified: Secondary | ICD-10-CM

## 2021-08-03 DIAGNOSIS — Z20822 Contact with and (suspected) exposure to covid-19: Secondary | ICD-10-CM | POA: Diagnosis not present

## 2021-08-03 DIAGNOSIS — Z03818 Encounter for observation for suspected exposure to other biological agents ruled out: Secondary | ICD-10-CM | POA: Diagnosis not present

## 2021-08-03 NOTE — Telephone Encounter (Signed)
Noted ty. No antibiotics only supportive OTC treatment. If COVID comes back positive and his symptoms get much worse he should let us know. Otherwise will resolve soon!

## 2021-08-03 NOTE — Progress Notes (Signed)
MyChart Video Visit    Virtual Visit via Video Note   This visit type was conducted due to national recommendations for restrictions regarding the COVID-19 Pandemic (e.g. social distancing) in an effort to limit this patient's exposure and mitigate transmission in our community. This patient is at least at moderate risk for complications without adequate follow up. This format is felt to be most appropriate for this patient at this time. Physical exam was limited by quality of the video and audio technology used for the visit.   Patient location: home Provider location: [facility]  I discussed the limitations of evaluation and management by telemedicine and the availability of in person appointments. The patient expressed understanding and agreed to proceed.  Patient: Steven Miranda   DOB: 05-14-1946   75 y.o. Male  MRN: [id number] Visit Date: 08/03/2021  Today's healthcare provider: Mikey Kirschner, PA-C   Cc. Sore throat  Subjective    HPI  Mary is a 75 y/o male who has concerns today over a sore throat for < 1 day. He states it started bothering him last night and continued this morning. Denies nasal congestion, nasal drip, cough, headaches, body aches, fever. Denies any contact with strep or COVID. He does state his grandchildren have the flu currently. Reports he took one dose of Amoxicillin that he had left over this morning. Pt is going out of town this weekend and wants antibiotics.   Medications: Outpatient Medications Prior to Visit  Medication Sig   ACCU-CHEK SMARTVIEW test strip CHECK  FASTING  BLOOD  SUGAR EVERY MORNING AS INSTRUCTED   amLODipine (NORVASC) 5 MG tablet Take 1 tablet (5 mg total) by mouth at bedtime.   atorvastatin (LIPITOR) 20 MG tablet Take 1 tablet (20 mg total) by mouth daily.   Blood Glucose Monitoring Suppl (ACCU-CHEK NANO SMARTVIEW) w/Device KIT Use glucometer (Accu-Chek Nano Glucometer) to test fasting glucose daily.   Ferrous Sulfate (IRON  SUPPLEMENT PO) Take 1 tablet by mouth daily.   fluticasone (FLONASE) 50 MCG/ACT nasal spray Place 2 sprays into both nostrils daily.   glucose blood (TRUE METRIX BLOOD GLUCOSE TEST) test strip Use as instructed   hyoscyamine (LEVSIN SL) 0.125 MG SL tablet Place 1 tablet (0.125 mg total) under the tongue every 6 (six) hours as needed.   JARDIANCE 25 MG TABS tablet TAKE 1 TABLET EVERY DAY BEFORE BREAKFAST   Lancets (ONETOUCH ULTRASOFT) lancets Use as instructed once a day for fasting glucose check.   lidocaine (XYLOCAINE) 2 % solution Mix 5 ml with 5 ml of liquid antacid for acute indigetion pain and swallow. May repeat 2-3 times in a day if needed.   metFORMIN (GLUCOPHAGE) 1000 MG tablet Take 1 tablet (1,000 mg total) by mouth 2 (two) times daily with a meal.   Misc Natural Products (OSTEO BI-FLEX ADV DOUBLE ST) CAPS Take 1 capsule by mouth daily.   pantoprazole (PROTONIX) 40 MG tablet TAKE 1 TABLET EVERY DAY   pioglitazone (ACTOS) 15 MG tablet TAKE 1 TABLET EVERY DAY   Probiotic Product (PROBIOTIC FORMULA) CAPS Take by mouth daily.   sucralfate (CARAFATE) 1 g tablet Take 1 tablet (1 g total) by mouth 4 (four) times daily -  with meals and at bedtime.   No facility-administered medications prior to visit.    Review of Systems  HENT:  Positive for sore throat.   All other systems reviewed and are negative.    Objective    There were no vitals taken for this visit.  Physical Exam Constitutional:      General: He is not in acute distress. Pulmonary:     Effort: Pulmonary effort is normal.  Neurological:     Mental Status: He is alert and oriented to person, place, and time.  Psychiatric:        Mood and Affect: Mood normal.        Behavior: Behavior normal.       Assessment & Plan    Sore throat, URI I discussed sore throat for one day with no strep contact is likely viral, advised OTC painkillers--tylenol, motrin, and if he does feel a post nasal drip, flonase, saline spray.  He can also gargle with salt water. I advised I cannot rx abx with his current symptoms as the likelihood of a bacterial infection is low, we have flu and strep swab here but results are in 48 hours. As he is leaving town, he would prefer to have rapid testing, we guided him to a clinic for a rapid flu and strep test. He will follow up with the results and then I can further determine treatment. I also advised a home COVID test.   I discussed the assessment and treatment plan with the patient. The patient was provided an opportunity to ask questions and all were answered. The patient agreed with the plan and demonstrated an understanding of the instructions.   The patient was advised to call back or seek an in-person evaluation if the symptoms worsen or if the condition fails to improve as anticipated.  I provided 10 minutes of non-face-to-face time during this encounter.  I, Mikey Kirschner, PA-C have reviewed all documentation for this visit. The documentation on  08/03/2021 for the exam, diagnosis, procedures, and orders are all accurate and complete.   Mikey Kirschner, PA-C [facility] [phone] (phone) [phone] (fax)  [place]

## 2021-09-13 ENCOUNTER — Encounter: Payer: Self-pay | Admitting: Family Medicine

## 2021-09-13 ENCOUNTER — Other Ambulatory Visit: Payer: Self-pay | Admitting: *Deleted

## 2021-09-13 DIAGNOSIS — E0800 Diabetes mellitus due to underlying condition with hyperosmolarity without nonketotic hyperglycemic-hyperosmolar coma (NKHHC): Secondary | ICD-10-CM

## 2021-09-13 MED ORDER — METFORMIN HCL 1000 MG PO TABS: 1000.0000 mg | ORAL_TABLET | Freq: Two times a day (BID) | ORAL | 1 refills | Status: DC

## 2021-09-17 ENCOUNTER — Encounter: Payer: Self-pay | Admitting: Family Medicine

## 2021-09-17 ENCOUNTER — Other Ambulatory Visit: Payer: Self-pay

## 2021-09-17 DIAGNOSIS — I10 Essential (primary) hypertension: Secondary | ICD-10-CM

## 2021-09-17 MED ORDER — AMLODIPINE BESYLATE 5 MG PO TABS: 5.0000 mg | ORAL_TABLET | Freq: Every day | ORAL | 0 refills | Status: DC

## 2021-10-02 DIAGNOSIS — Z0100 Encounter for examination of eyes and vision without abnormal findings: Secondary | ICD-10-CM | POA: Diagnosis not present

## 2021-10-12 DIAGNOSIS — J0140 Acute pansinusitis, unspecified: Secondary | ICD-10-CM | POA: Diagnosis not present

## 2021-10-17 ENCOUNTER — Other Ambulatory Visit: Payer: Self-pay

## 2021-10-17 ENCOUNTER — Telehealth: Payer: Self-pay

## 2021-10-17 DIAGNOSIS — E0800 Diabetes mellitus due to underlying condition with hyperosmolarity without nonketotic hyperglycemic-hyperosmolar coma (NKHHC): Secondary | ICD-10-CM

## 2021-10-17 NOTE — Telephone Encounter (Signed)
[place] faxed refill request for the following medications:  metFORMIN (GLUCOPHAGE) 1000 MG tablet    Please advise.

## 2021-11-14 ENCOUNTER — Encounter: Payer: Self-pay | Admitting: Family Medicine

## 2021-11-14 ENCOUNTER — Other Ambulatory Visit: Payer: Self-pay

## 2021-11-14 ENCOUNTER — Ambulatory Visit (INDEPENDENT_AMBULATORY_CARE_PROVIDER_SITE_OTHER): Payer: Medicare HMO | Admitting: Family Medicine

## 2021-11-14 VITALS — BP 127/78 | HR 61 | Temp 98.2°F | Resp 16 | Ht 74.0 in

## 2021-11-14 DIAGNOSIS — Z0000 Encounter for general adult medical examination without abnormal findings: Secondary | ICD-10-CM

## 2021-11-14 DIAGNOSIS — H53001 Unspecified amblyopia, right eye: Secondary | ICD-10-CM

## 2021-11-14 DIAGNOSIS — Z1211 Encounter for screening for malignant neoplasm of colon: Secondary | ICD-10-CM | POA: Diagnosis not present

## 2021-11-14 DIAGNOSIS — E782 Mixed hyperlipidemia: Secondary | ICD-10-CM | POA: Diagnosis not present

## 2021-11-14 DIAGNOSIS — I10 Essential (primary) hypertension: Secondary | ICD-10-CM

## 2021-11-14 DIAGNOSIS — E0800 Diabetes mellitus due to underlying condition with hyperosmolarity without nonketotic hyperglycemic-hyperosmolar coma (NKHHC): Secondary | ICD-10-CM

## 2021-11-14 DIAGNOSIS — Z86010 Personal history of colonic polyps: Secondary | ICD-10-CM

## 2021-11-14 MED ORDER — PEG 3350-KCL-NA BICARB-NACL 420 G PO SOLR: 4000.0000 mL | Freq: Once | ORAL | 0 refills | Status: AC

## 2021-11-14 NOTE — Progress Notes (Signed)
Gastroenterology Pre-Procedure Review  Request Date: 12/25/2021 Requesting Physician: Dr. Allen Norris  PATIENT REVIEW QUESTIONS: The patient responded to the following health history questions as indicated:    1. Are you having any GI issues? no 2. Do you have a personal history of Polyps? yes (LAST COLONOSCOPY) 3. Do you have a family history of Colon Cancer or Polyps? no 4. Diabetes Mellitus? yes (TYPE 2) 5. Joint replacements in the past 12 months?no 6. Major health problems in the past 3 months?no 7. Any artificial heart valves, MVP, or defibrillator?no    MEDICATIONS & ALLERGIES:    Patient reports the following regarding taking any anticoagulation/antiplatelet therapy:   Plavix, Coumadin, Eliquis, Xarelto, Lovenox, Pradaxa, Brilinta, or Effient? no Aspirin? no  Patient confirms/reports the following medications:  Current Outpatient Medications  Medication Sig Dispense Refill   ACCU-CHEK SMARTVIEW test strip CHECK  FASTING  BLOOD  SUGAR EVERY MORNING AS INSTRUCTED 100 each 4   amLODipine (NORVASC) 5 MG tablet Take 1 tablet (5 mg total) by mouth at bedtime. 30 tablet 0   atorvastatin (LIPITOR) 20 MG tablet Take 1 tablet (20 mg total) by mouth daily. 90 tablet 1   Blood Glucose Monitoring Suppl (ACCU-CHEK NANO SMARTVIEW) w/Device KIT Use glucometer (Accu-Chek Nano Glucometer) to test fasting glucose daily. 1 kit 0   Ferrous Sulfate (IRON SUPPLEMENT PO) Take 1 tablet by mouth daily.     glucose blood (TRUE METRIX BLOOD GLUCOSE TEST) test strip Use as instructed 100 each 12   hyoscyamine (LEVSIN SL) 0.125 MG SL tablet Place 1 tablet (0.125 mg total) under the tongue every 6 (six) hours as needed. 60 tablet 5   JARDIANCE 25 MG TABS tablet TAKE 1 TABLET EVERY DAY BEFORE BREAKFAST 90 tablet 3   Lancets (ONETOUCH ULTRASOFT) lancets Use as instructed once a day for fasting glucose check. 100 each 3   lidocaine (XYLOCAINE) 2 % solution Mix 5 ml with 5 ml of liquid antacid for acute indigetion  pain and swallow. May repeat 2-3 times in a day if needed. 100 mL 0   metFORMIN (GLUCOPHAGE) 1000 MG tablet Take 1 tablet (1,000 mg total) by mouth 2 (two) times daily with a meal. 180 tablet 1   Misc Natural Products (OSTEO BI-FLEX ADV DOUBLE ST) CAPS Take 1 capsule by mouth daily.     pantoprazole (PROTONIX) 40 MG tablet TAKE 1 TABLET EVERY DAY 90 tablet 1   pioglitazone (ACTOS) 15 MG tablet TAKE 1 TABLET EVERY DAY 90 tablet 0   Probiotic Product (PROBIOTIC FORMULA) CAPS Take by mouth daily.     sucralfate (CARAFATE) 1 g tablet Take 1 tablet (1 g total) by mouth 4 (four) times daily -  with meals and at bedtime. 120 tablet 3   No current facility-administered medications for this visit.    Patient confirms/reports the following allergies:  No Known Allergies  No orders of the defined types were placed in this encounter.   AUTHORIZATION INFORMATION Primary Insurance: 1D#: Group #:  Secondary Insurance: 1D#: Group #:  SCHEDULE INFORMATION: Date: 12/25/2021 Time: Location:[facility]

## 2021-11-14 NOTE — Progress Notes (Signed)
Annual Wellness Visit    I,April Miller,acting as a scribe for Wilhemena Durie, MD.,have documented all relevant documentation on the behalf of Wilhemena Durie, MD,as directed by  Wilhemena Durie, MD while in the presence of Wilhemena Durie, MD.   Patient: Steven Miranda, Male    DOB: Feb 19, 1946, 76 y.o.   MRN: [id number] Visit Date: 11/14/2021  Today's Provider: Wilhemena Durie, MD   Chief Complaint  Patient presents with   Medicare Wellness   Subjective    Steven Miranda is a 76 y.o. male who presents today for his Annual Wellness Visit.Annual Physical. He reports consuming a general diet. Gym/ health club routine includes gym 3-4 times a week. He generally feels well. He reports sleeping well. He does not have additional problems to discuss today.  He works in Insurance underwriter.  He is married and has 3 children ages 6 and 51 and he has 6 grandchildren. HPI    Medications: Outpatient Medications Prior to Visit  Medication Sig   ACCU-CHEK SMARTVIEW test strip CHECK  FASTING  BLOOD  SUGAR EVERY MORNING AS INSTRUCTED   amLODipine (NORVASC) 5 MG tablet Take 1 tablet (5 mg total) by mouth at bedtime.   atorvastatin (LIPITOR) 20 MG tablet Take 1 tablet (20 mg total) by mouth daily.   Blood Glucose Monitoring Suppl (ACCU-CHEK NANO SMARTVIEW) w/Device KIT Use glucometer (Accu-Chek Nano Glucometer) to test fasting glucose daily.   Ferrous Sulfate (IRON SUPPLEMENT PO) Take 1 tablet by mouth daily.   glucose blood (TRUE METRIX BLOOD GLUCOSE TEST) test strip Use as instructed   hyoscyamine (LEVSIN SL) 0.125 MG SL tablet Place 1 tablet (0.125 mg total) under the tongue every 6 (six) hours as needed.   JARDIANCE 25 MG TABS tablet TAKE 1 TABLET EVERY DAY BEFORE BREAKFAST   Lancets (ONETOUCH ULTRASOFT) lancets Use as instructed once a day for fasting glucose check.   lidocaine (XYLOCAINE) 2 % solution Mix 5 ml with 5 ml of liquid antacid for acute indigetion pain and swallow.  May repeat 2-3 times in a day if needed.   metFORMIN (GLUCOPHAGE) 1000 MG tablet Take 1 tablet (1,000 mg total) by mouth 2 (two) times daily with a meal.   Misc Natural Products (OSTEO BI-FLEX ADV DOUBLE ST) CAPS Take 1 capsule by mouth daily.   pantoprazole (PROTONIX) 40 MG tablet TAKE 1 TABLET EVERY DAY   pioglitazone (ACTOS) 15 MG tablet TAKE 1 TABLET EVERY DAY   Probiotic Product (PROBIOTIC FORMULA) CAPS Take by mouth daily.   sucralfate (CARAFATE) 1 g tablet Take 1 tablet (1 g total) by mouth 4 (four) times daily -  with meals and at bedtime.   [DISCONTINUED] fluticasone (FLONASE) 50 MCG/ACT nasal spray Place 2 sprays into both nostrils daily.   No facility-administered medications prior to visit.    No Known Allergies  Patient Care Team: Jerrol Banana., MD as PCP - General (Family Medicine) Kate Sable, MD as PCP - Cardiology (Cardiology) [facility], [facility]  Review of Systems       Objective    Vitals: BP 127/78 (BP Location: Right Arm, Patient Position: Sitting, Cuff Size: Large)    Pulse 61    Temp 98.2 F (36.8 C) (Temporal)    Resp 16    Ht 6' 2"  (1.88 m)    SpO2 97%    BMI 28.25 kg/m  BP Readings from Last 3 Encounters:  11/14/21 127/78  02/12/21 134/74  01/11/21  128/80   Wt Readings from Last 3 Encounters:  02/12/21 220 lb (99.8 kg)  01/11/21 254 lb (115.2 kg)  10/12/20 252 lb 4 oz (114.4 kg)      Physical Exam Vitals reviewed.  Constitutional:      Appearance: He is well-developed.  HENT:     Head: Normocephalic and atraumatic.     Right Ear: Tympanic membrane and external ear normal.     Left Ear: Tympanic membrane and external ear normal.     Nose: Nose normal.     Mouth/Throat:     Pharynx: Oropharynx is clear.  Eyes:     General:        Right eye: No discharge.     Conjunctiva/sclera: Conjunctivae normal.     Pupils: Pupils are equal, round, and reactive to light.     Comments: Congenital strabismus. Right ptosis which is  chronic and worsening.  Neck:     Thyroid: No thyromegaly.     Trachea: No tracheal deviation.  Cardiovascular:     Rate and Rhythm: Normal rate and regular rhythm.     Heart sounds: Normal heart sounds. No murmur heard. Pulmonary:     Effort: Pulmonary effort is normal. No respiratory distress.     Breath sounds: Normal breath sounds. No wheezing or rales.  Chest:     Chest wall: No tenderness.  Abdominal:     General: There is no distension.     Palpations: Abdomen is soft. There is no mass.     Tenderness: There is no abdominal tenderness. There is no guarding or rebound.  Genitourinary:    Penis: Normal.      Testes: Normal.     Rectum: Guaiac result negative.  Musculoskeletal:        General: No tenderness. Normal range of motion.     Cervical back: Normal range of motion and neck supple.  Lymphadenopathy:     Cervical: No cervical adenopathy.  Skin:    General: Skin is warm and dry.     Findings: No erythema or rash.  Neurological:     Mental Status: He is alert and oriented to person, place, and time.     Cranial Nerves: No cranial nerve deficit.     Motor: No abnormal muscle tone.     Coordination: Coordination normal.     Deep Tendon Reflexes: Reflexes are normal and symmetric. Reflexes normal.  Psychiatric:        Behavior: Behavior normal.        Thought Content: Thought content normal.        Judgment: Judgment normal.     Results of the Epworth flowsheet 11/14/2021  Sitting and reading 1  Watching TV 2  Sitting, inactive in a public place (e.g. a theatre or a meeting) 0  As a passenger in a car for an hour without a break 0  Lying down to rest in the afternoon when circumstances permit 0  Sitting and talking to someone 0  Sitting quietly after a lunch without alcohol 0  In a car, while stopped for a few minutes in traffic 0  Total score 3    Most recent functional status assessment: In your present state of health, do you have any difficulty  performing the following activities: 11/14/2021  Hearing? N  Vision? N  Difficulty concentrating or making decisions? N  Walking or climbing stairs? N  Dressing or bathing? N  Doing errands, shopping? N  Some recent data might be hidden  Most recent fall risk assessment: Fall Risk  11/14/2021  Falls in the past year? 0  Number falls in past yr: 0  Injury with Fall? 0  Risk for fall due to : No Fall Risks  Follow up Falls evaluation completed    Most recent depression screenings: PHQ 2/9 Scores 11/14/2021 07/17/2020  PHQ - 2 Score 0 0  PHQ- 9 Score 0 -   Most recent cognitive screening: No flowsheet data found. Most recent Audit-C alcohol use screening Alcohol Use Disorder Test (AUDIT) 11/14/2021  1. How often do you have a drink containing alcohol? 3  2. How many drinks containing alcohol do you have on a typical day when you are drinking? 0  3. How often do you have six or more drinks on one occasion? 0  AUDIT-C Score 3  4. How often during the last year have you found that you were not able to stop drinking once you had started? 0  5. How often during the last year have you failed to do what was normally expected from you because of drinking? 0  6. How often during the last year have you needed a first drink in the morning to get yourself going after a heavy drinking session? 0  7. How often during the last year have you had a feeling of guilt of remorse after drinking? 0  8. How often during the last year have you been unable to remember what happened the night before because you had been drinking? 0  9. Have you or someone else been injured as a result of your drinking? 0  10. Has a relative or friend or a doctor or another health worker been concerned about your drinking or suggested you cut down? 0  Alcohol Use Disorder Identification Test Final Score (AUDIT) 3  Alcohol Brief Interventions/Follow-up -   A score of 3 or more in women, and 4 or more in men indicates increased  risk for alcohol abuse, EXCEPT if all of the points are from question 1   No results found for any visits on 11/14/21.  Assessment & Plan     Annual wellness visit done today including the all of the following: Reviewed patient's Family Medical History Reviewed and updated list of patient's medical providers Assessment of cognitive impairment was done Assessed patient's functional ability Established a written schedule for health screening [place] Completed and Reviewed  Exercise Activities and Dietary recommendations  Goals       I have reached the donut hole (pt-stated)      Current Barriers:  financial  Pharmacist Clinical Goal(s): Over the next 14 days, Mr.. Olden will provide the necessary supplementary documents (proof of out of pocket prescription expenditure, proof of household income) needed for medication assistance applications to CCM pharmacist.   Interventions: CCM pharmacist will apply for medication assistance program for Jardiance made by [place] Updated 8/10:  patient is over income and does not qualify for Jardiance assistance;   based on reported income, he will also be overincome for alternatives such as Trulicity and Ozempic; will coordinate with provider Simona Huh Chrismon regarding generic alternatives such as pioglitazone or glipizide XL; noted history of cancer in patient's mother, though unknown what type of cancer Update 8/13: mother had breast cancer; patient interested in cardio benefits offered by Jardiance (none proven with glipizide or pioglitazone)  Patient Self Care Activities:  Gather necessary documents needed to apply for medication assistance  Please see past updates related to  this goal by clicking on the "Past Updates" button in the selected goal        Reduce portion size      Recommend decreasing portion sizes and eating 3 meals a day and 2 healthy snacks in between.         Immunization History   Administered Date(s) Administered   Hepatitis A 02/02/2004   Influenza, High Dose Seasonal PF 07/08/2014, 06/07/2018, 06/09/2019, 06/07/2020, 06/07/2021   Influenza,inj,Quad PF,6+ Mos 07/23/2013   Influenza-Unspecified 06/15/2015, 06/30/2017   PFIZER(Purple Top)SARS-COV-2 Vaccination 11/17/2019, 12/08/2019   Pneumococcal Conjugate-13 05/10/2016   Pneumococcal Polysaccharide-23 07/18/2017   Tdap 08/22/2009    Health Maintenance  Topic Date Due   Zoster Vaccines- Shingrix (1 of 2) Never done   OPHTHALMOLOGY EXAM  06/25/2016   TETANUS/TDAP  08/23/2019   COVID-19 Vaccine (3 - Pfizer risk series) 01/05/2020   HEMOGLOBIN A1C  04/09/2021   FOOT EXAM  04/20/2021   URINE MICROALBUMIN  04/20/2021   COLONOSCOPY (Pts 45-51yr Insurance coverage will need to be confirmed)  10/16/2021   Pneumonia Vaccine 76 Years old  Completed   INFLUENZA VACCINE  Completed   Hepatitis C Screening  Completed   HPV VACCINES  Aged Out     Discussed health benefits of physical activity, and encouraged him to engage in regular exercise appropriate for his age and condition.    1. Encounter for Medicare annual wellness exam  - Lipid panel - TSH - CBC w/Diff/Platelet - Comprehensive Metabolic Panel (CMET) - Hemoglobin A1c  2. Annual physical exam   3. Essential (primary) hypertension  - Lipid panel - TSH - CBC w/Diff/Platelet - Comprehensive Metabolic Panel (CMET) - Hemoglobin A1c  4. Diabetes mellitus due to underlying condition with hyperosmolarity without coma, without long-term current use of insulin (HCC) Follow-up A1c in 6 months.  Well-controlled at this point. - Lipid panel - TSH - CBC w/Diff/Platelet - Comprehensive Metabolic Panel (CMET) - Hemoglobin A1c  5. Mixed hyperlipidemia  - Lipid panel - TSH - CBC w/Diff/Platelet - Comprehensive Metabolic Panel (CMET) - Hemoglobin A1c  6. Screening for colon cancer For follow-up - Ambulatory referral to Gastroenterology  7.  Amblyopia of right eye With ptosis of right lid.  Patient followed by optometry.     Return in about 6 months (around 05/14/2022).     I, RWilhemena Durie MD, have reviewed all documentation for this visit. The documentation on 11/17/21 for the exam, diagnosis, procedures, and orders are all accurate and complete.    Darlisha Kelm GCranford Mon MD  [phone](phone) [phone](fax)  [place]

## 2021-11-15 LAB — CBC WITH DIFFERENTIAL/PLATELET
Basophils Absolute: 0 10*3/uL (ref 0.0–0.2)
Basos: 1 %
EOS (ABSOLUTE): 0.1 10*3/uL (ref 0.0–0.4)
Eos: 2 %
Hematocrit: 44.6 % (ref 37.5–51.0)
Hemoglobin: 15.9 g/dL (ref 13.0–17.7)
Immature Grans (Abs): 0 10*3/uL (ref 0.0–0.1)
Immature Granulocytes: 0 %
Lymphocytes Absolute: 1.5 10*3/uL (ref 0.7–3.1)
Lymphs: 28 %
MCH: 31.9 pg (ref 26.6–33.0)
MCHC: 35.7 g/dL (ref 31.5–35.7)
MCV: 90 fL (ref 79–97)
Monocytes Absolute: 0.6 10*3/uL (ref 0.1–0.9)
Monocytes: 11 %
Neutrophils Absolute: 3.1 10*3/uL (ref 1.4–7.0)
Neutrophils: 58 %
Platelets: 233 10*3/uL (ref 150–450)
RBC: 4.98 x10E6/uL (ref 4.14–5.80)
RDW: 12.5 % (ref 11.6–15.4)
WBC: 5.3 10*3/uL (ref 3.4–10.8)

## 2021-11-15 LAB — LIPID PANEL
Chol/HDL Ratio: 2.3 ratio (ref 0.0–5.0)
Cholesterol, Total: 157 mg/dL (ref 100–199)
HDL: 67 mg/dL (ref >39)
LDL Chol Calc (NIH): 74 mg/dL (ref 0–99)
Triglycerides: 87 mg/dL (ref 0–149)
VLDL Cholesterol Cal: 16 mg/dL (ref 5–40)

## 2021-11-15 LAB — COMPREHENSIVE METABOLIC PANEL
ALT: 25 IU/L (ref 0–44)
AST: 27 IU/L (ref 0–40)
Albumin/Globulin Ratio: 1.8 (ref 1.2–2.2)
Albumin: 4.8 g/dL — ABNORMAL HIGH (ref 3.7–4.7)
Alkaline Phosphatase: 79 IU/L (ref 44–121)
BUN/Creatinine Ratio: 12 (ref 10–24)
BUN: 13 mg/dL (ref 8–27)
Bilirubin Total: 0.9 mg/dL (ref 0.0–1.2)
CO2: 25 mmol/L (ref 20–29)
Calcium: 9.6 mg/dL (ref 8.6–10.2)
Chloride: 100 mmol/L (ref 96–106)
Creatinine, Ser: 1.05 mg/dL (ref 0.76–1.27)
Globulin, Total: 2.7 g/dL (ref 1.5–4.5)
Glucose: 127 mg/dL — ABNORMAL HIGH (ref 70–99)
Potassium: 4.7 mmol/L (ref 3.5–5.2)
Sodium: 139 mmol/L (ref 134–144)
Total Protein: 7.5 g/dL (ref 6.0–8.5)
eGFR: 74 mL/min/{1.73_m2} (ref >59)

## 2021-11-15 LAB — HEMOGLOBIN A1C
Est. average glucose Bld gHb Est-mCnc: 146 mg/dL
Hgb A1c MFr Bld: 6.7 % — ABNORMAL HIGH (ref 4.8–5.6)

## 2021-11-15 LAB — TSH: TSH: 3.09 u[IU]/mL (ref 0.450–4.500)

## 2021-12-07 ENCOUNTER — Telehealth: Payer: Self-pay | Admitting: Family Medicine

## 2021-12-07 ENCOUNTER — Encounter: Payer: Self-pay | Admitting: Family Medicine

## 2021-12-07 DIAGNOSIS — E782 Mixed hyperlipidemia: Secondary | ICD-10-CM

## 2021-12-07 DIAGNOSIS — I10 Essential (primary) hypertension: Secondary | ICD-10-CM

## 2021-12-07 DIAGNOSIS — E119 Type 2 diabetes mellitus without complications: Secondary | ICD-10-CM

## 2021-12-07 MED ORDER — ATORVASTATIN CALCIUM 20 MG PO TABS: 20.0000 mg | ORAL_TABLET | Freq: Every day | ORAL | 1 refills | Status: DC

## 2021-12-07 MED ORDER — PIOGLITAZONE HCL 15 MG PO TABS: 15.0000 mg | ORAL_TABLET | Freq: Every day | ORAL | 1 refills | Status: DC

## 2021-12-07 MED ORDER — AMLODIPINE BESYLATE 5 MG PO TABS: 5.0000 mg | ORAL_TABLET | Freq: Every day | ORAL | 1 refills | Status: DC

## 2021-12-07 NOTE — Telephone Encounter (Signed)
Sent to pharmacy 

## 2021-12-07 NOTE — Telephone Encounter (Signed)
[facility] faxed refill request for the following medications: ? ?amLODipine (NORVASC) 5 MG tablet  ? ?pioglitazone (ACTOS) 15 MG tablet  ? ?atorvastatin (LIPITOR) 20 MG tablet  ? ?Please advise. ? ?

## 2021-12-11 ENCOUNTER — Encounter: Payer: Self-pay | Admitting: Physician Assistant

## 2021-12-18 ENCOUNTER — Ambulatory Visit: Payer: Medicare HMO | Admitting: Physician Assistant

## 2021-12-20 ENCOUNTER — Other Ambulatory Visit: Payer: Self-pay | Admitting: *Deleted

## 2021-12-20 ENCOUNTER — Encounter: Payer: Self-pay | Admitting: Family Medicine

## 2021-12-20 DIAGNOSIS — H02401 Unspecified ptosis of right eyelid: Secondary | ICD-10-CM

## 2021-12-20 NOTE — Telephone Encounter (Signed)
Please refer to [facility] for chronic right ptosis.  Thank you.

## 2021-12-25 ENCOUNTER — Other Ambulatory Visit: Payer: Self-pay

## 2021-12-25 ENCOUNTER — Encounter: Payer: Self-pay | Admitting: Gastroenterology

## 2021-12-25 ENCOUNTER — Ambulatory Visit: Payer: Medicare HMO | Admitting: Anesthesiology

## 2021-12-25 ENCOUNTER — Encounter
Admission: RE | Disposition: A | Discharge status: Home / Self Care | Payer: Self-pay | Source: Home / Self Care | Attending: Gastroenterology

## 2021-12-25 ENCOUNTER — Ambulatory Visit
Admission: RE | Admit: 2021-12-25 | Discharge: 2021-12-25 | Disposition: A | Discharge status: Home / Self Care | Payer: Medicare HMO | Attending: Gastroenterology | Admitting: Gastroenterology

## 2021-12-25 DIAGNOSIS — I10 Essential (primary) hypertension: Secondary | ICD-10-CM | POA: Diagnosis not present

## 2021-12-25 DIAGNOSIS — Z1211 Encounter for screening for malignant neoplasm of colon: Secondary | ICD-10-CM | POA: Insufficient documentation

## 2021-12-25 DIAGNOSIS — K648 Other hemorrhoids: Secondary | ICD-10-CM | POA: Diagnosis not present

## 2021-12-25 DIAGNOSIS — K635 Polyp of colon: Secondary | ICD-10-CM | POA: Diagnosis not present

## 2021-12-25 DIAGNOSIS — Z860100 Personal history of colon polyps, unspecified: Secondary | ICD-10-CM

## 2021-12-25 DIAGNOSIS — Z87891 Personal history of nicotine dependence: Secondary | ICD-10-CM | POA: Diagnosis not present

## 2021-12-25 DIAGNOSIS — E785 Hyperlipidemia, unspecified: Secondary | ICD-10-CM | POA: Diagnosis not present

## 2021-12-25 DIAGNOSIS — D122 Benign neoplasm of ascending colon: Secondary | ICD-10-CM | POA: Diagnosis not present

## 2021-12-25 DIAGNOSIS — E119 Type 2 diabetes mellitus without complications: Secondary | ICD-10-CM | POA: Diagnosis not present

## 2021-12-25 DIAGNOSIS — D126 Benign neoplasm of colon, unspecified: Secondary | ICD-10-CM | POA: Diagnosis not present

## 2021-12-25 HISTORY — PX: COLONOSCOPY WITH PROPOFOL: SHX5780

## 2021-12-25 LAB — GLUCOSE, CAPILLARY: Glucose-Capillary: 122 mg/dL — ABNORMAL HIGH (ref 70–99)

## 2021-12-25 SURGERY — COLONOSCOPY WITH PROPOFOL
Anesthesia: General

## 2021-12-25 MED ORDER — PROPOFOL 10 MG/ML IV BOLUS
INTRAVENOUS | Status: DC | PRN
  Administered 2021-12-25: 80 mg via INTRAVENOUS

## 2021-12-25 MED ORDER — PROPOFOL 500 MG/50ML IV EMUL
INTRAVENOUS | Status: DC | PRN
  Administered 2021-12-25: 150 ug/kg/min via INTRAVENOUS

## 2021-12-25 MED ORDER — SODIUM CHLORIDE 0.9 % IV SOLN: INTRAVENOUS | Status: DC

## 2021-12-25 MED ORDER — LIDOCAINE HCL (CARDIAC) PF 100 MG/5ML IV SOSY
PREFILLED_SYRINGE | INTRAVENOUS | Status: DC | PRN
  Administered 2021-12-25: 50 mg via INTRAVENOUS

## 2021-12-25 NOTE — Op Note (Signed)
[facility] ?Gastroenterology ?Patient Name: Steven Miranda ?Procedure Date: 12/25/2021 9:13 AM ?MRN: [id number] ?Account #: [id number] ?Date of Birth: [birth year] ?Admit Type: Outpatient ?Age: 76 ?Room: [facility] ENDO ROOM 4 ?Gender: Male ?Note Status: Finalized ?Instrument Name: Colonoscope [id number] ?Procedure:             Colonoscopy ?Indications:           High risk colon cancer surveillance: Personal history  ?                       of colonic polyps ?Providers:             Lucilla Lame MD, MD ?Referring MD:          Janine Ores. Rosanna Randy, MD (Referring MD) ?Medicines:             Propofol per Anesthesia ?Complications:         No immediate complications. ?Procedure:             Pre-Anesthesia Assessment: ?                       - Prior to the procedure, a History and Physical was  ?                       performed, and patient medications and allergies were  ?                       reviewed. The patient's tolerance of previous  ?                       anesthesia was also reviewed. The risks and benefits  ?                       of the procedure and the sedation options and risks  ?                       were discussed with the patient. All questions were  ?                       answered, and informed consent was obtained. Prior  ?                       Anticoagulants: The patient has taken no previous  ?                       anticoagulant or antiplatelet agents. ASA Grade  ?                       Assessment: II - A patient with mild systemic disease.  ?                       After reviewing the risks and benefits, the patient  ?                       was deemed in satisfactory condition to undergo the  ?                       procedure. ?  After obtaining informed consent, the colonoscope was  ?                       passed under direct vision. Throughout the procedure,  ?                       the patient's blood pressure, pulse, and oxygen  ?                       saturations were  monitored continuously. The  ?                       Colonoscope was introduced through the anus and  ?                       advanced to the the cecum, identified by appendiceal  ?                       orifice and ileocecal valve. The colonoscopy was  ?                       performed without difficulty. The patient tolerated  ?                       the procedure well. The quality of the bowel  ?                       preparation was good. ?Findings: ?     The perianal and digital rectal examinations were normal. ?     A 20 mm polyp was found in the ascending colon. The polyp was sessile.  ?     Preparations were made for mucosal resection. Chromoscopy with indigo  ?     carmine was done to mark the borders of the lesion. Saline with indigo  ?     carmine was injected to raise the lesion. Snare mucosal resection was  ?     performed. Resection and retrieval were complete. To close a defect  ?     after polypectomy, three hemostatic clips were successfully placed (MR  ?     conditional). There was no bleeding at the end of the procedure. ?     Non-bleeding internal hemorrhoids were found during retroflexion. The  ?     hemorrhoids were Grade II (internal hemorrhoids that prolapse but reduce  ?     spontaneously). ?Impression:            - One 20 mm polyp in the ascending colon, removed with  ?                       mucosal resection. Resected and retrieved. Clips (MR  ?                       conditional) were placed. ?                       - Non-bleeding internal hemorrhoids. ?                       - Mucosal resection was performed. Resection and  ?  retrieval were complete. ?Recommendation:        - Discharge patient to home. ?                       - Resume previous diet. ?                       - Continue present medications. ?                       - Await pathology results. ?                       - Repeat colonoscopy in 1 year for surveillance. ?Procedure Code(s):     --- Professional  --- ?                       [phone], Colonoscopy, flexible; with endoscopic mucosal  ?                       resection ?Diagnosis Code(s):     --- Professional --- ?                       Z86.010, Personal history of colonic polyps ?                       K63.5, Polyp of colon ?CPT copyright 2019 American Medical Association. All rights reserved. ?The codes documented in this report are preliminary and upon coder review may  ?be revised to meet current compliance requirements. ?Lucilla Lame MD, MD ?12/25/2021 9:50:49 AM ?This report has been signed electronically. ?Number of Addenda: 0 ?Note Initiated On: 12/25/2021 9:13 AM ?Scope Withdrawal Time: 0 hours 20 minutes 16 seconds  ?Total Procedure Duration: 0 hours 23 minutes 35 seconds  ?Estimated Blood Loss:  Estimated blood loss: none. ?     [facility] ?

## 2021-12-25 NOTE — H&P (Signed)
? ?Lucilla Lame, MD [facility] ?[address] ?[place], [place] [address] ?Phone:[phone] ?Fax : [phone] ? ?Primary Care Physician:  Jerrol Banana., MD ?Primary Gastroenterologist:  Dr. Allen Norris ? ?Pre-Procedure History & Physical: ?HPI:  Steven Miranda is a 76 y.o. male is here for an colonoscopy. ?  ?Past Medical History:  ?Diagnosis Date  ? Actinic keratosis   ? Arthritis   ? right thumb  ? Broken wrist   ? Diabetes mellitus without complication ([place])   ? Dysuria   ? Hyperlipidemia   ? Hypertension   ? Wears contact lenses   ? ? ?Past Surgical History:  ?Procedure Laterality Date  ? COLONOSCOPY  2005  ? COLONOSCOPY WITH PROPOFOL N/A 10/16/2016  ? Procedure: COLONOSCOPY WITH PROPOFOL;  Surgeon: Robert Bellow, MD;  Location[facility] ENDOSCOPY;  Service: Endoscopy;  Laterality: N/A;  ? ESOPHAGOGASTRODUODENOSCOPY (EGD) WITH PROPOFOL N/A 12/13/2019  ? Procedure: ESOPHAGOGASTRODUODENOSCOPY (EGD) WITH BIOPSY;  Surgeon: Lucilla Lame, MD;  Location: [facility];  Service: Endoscopy;  Laterality: N/A;  Diabetic - oral meds  ? PLANTAR'S WART EXCISION    ? STRABISMUS SURGERY Right   ? ? ?Prior to Admission medications   ?Medication Sig Start Date End Date Taking? Authorizing Provider  ?amLODipine (NORVASC) 5 MG tablet Take 1 tablet (5 mg total) by mouth at bedtime. 12/07/21  Yes Jerrol Banana., MD  ?atorvastatin (LIPITOR) 20 MG tablet Take 1 tablet (20 mg total) by mouth daily. 12/07/21  Yes Jerrol Banana., MD  ?JARDIANCE 25 MG TABS tablet TAKE 1 TABLET EVERY DAY BEFORE BREAKFAST 11/30/20  Yes Jerrol Banana., MD  ?metFORMIN (GLUCOPHAGE) 1000 MG tablet Take 1 tablet (1,000 mg total) by mouth 2 (two) times daily with a meal. 09/13/21  Yes Birdie Sons, MD  ?pantoprazole (PROTONIX) 40 MG tablet TAKE 1 TABLET EVERY DAY 05/24/21  Yes Lucilla Lame, MD  ?pioglitazone (ACTOS) 15 MG tablet Take 1 tablet (15 mg total) by mouth daily. 12/07/21  Yes Jerrol Banana., MD  ?Probiotic  Product (PROBIOTIC FORMULA) CAPS Take by mouth daily.   Yes [provider]  ?sucralfate (CARAFATE) 1 g tablet Take 1 tablet (1 g total) by mouth 4 (four) times daily -  with meals and at bedtime. 02/17/20  Yes Lucilla Lame, MD  ?ACCU-CHEK SMARTVIEW test strip CHECK  FASTING  BLOOD  SUGAR EVERY MORNING AS INSTRUCTED 10/29/16   Chrismon, Vickki Muff, PA-C  ?Blood Glucose Monitoring Suppl (ACCU-CHEK NANO SMARTVIEW) w/Device KIT Use glucometer (Accu-Chek Nano Glucometer) to test fasting glucose daily. 12/31/16   Chrismon, Vickki Muff, PA-C  ?Ferrous Sulfate (IRON SUPPLEMENT PO) Take 1 tablet by mouth daily.    [provider]  ?glucose blood (TRUE METRIX BLOOD GLUCOSE TEST) test strip Use as instructed 06/14/19   Chrismon, Vickki Muff, PA-C  ?hyoscyamine (LEVSIN SL) 0.125 MG SL tablet Place 1 tablet (0.125 mg total) under the tongue every 6 (six) hours as needed. 02/17/20   Lucilla Lame, MD  ?Lancets [facility] ULTRASOFT) lancets Use as instructed once a day for fasting glucose check. 03/30/15   Chrismon, Vickki Muff, PA-C  ?lidocaine (XYLOCAINE) 2 % solution Mix 5 ml with 5 ml of liquid antacid for acute indigetion pain and swallow. May repeat 2-3 times in a day if needed. 08/10/19   Chrismon, Vickki Muff, PA-C  ?Misc Natural Products (OSTEO BI-FLEX ADV DOUBLE ST) CAPS Take 1 capsule by mouth daily.    [provider]  ? ? ?  Allergies as of 11/14/2021  ? (No Known Allergies)  ? ? ?Family History  ?Problem Relation Age of Onset  ? Cancer Mother   ? Alcohol abuse Father   ? Heart disease Maternal Uncle   ? Rheumatic fever Maternal Uncle   ? Diabetes Paternal Grandmother   ? Emphysema Paternal Grandfather   ? ? ?Social History  ? ?Socioeconomic History  ? Marital status: Married  ?  Spouse name: Not on file  ? Number of children: 3  ? Years of education: Not on file  ? Highest education level: Bachelor's degree (e.g., BA, AB, BS)  ?Occupational History  ? Not on file  ?Tobacco Use  ? Smoking status: Former  ?  Types:  Cigarettes  ?  Quit date: 72  ?  Years since quitting: 45.2  ? Smokeless tobacco: Never  ? Tobacco comments:  ?  quit in 1978  ?Vaping Use  ? Vaping Use: Never used  ?Substance and Sexual Activity  ? Alcohol use: Yes  ?  Alcohol/week: 6.0 standard drinks  ?  Types: 6 Cans of beer per week  ?  Comment: a couple beers on the weekends  ? Drug use: No  ? Sexual activity: Not on file  ?Other Topics Concern  ? Not on file  ?Social History Narrative  ? Not on file  ? ?Social Determinants of Health  ? ?Financial Resource Strain: Low Risk   ? Difficulty of Paying Living Expenses: Not hard at all  ?Food Insecurity: No Food Insecurity  ? Worried About Charity fundraiser in the Last Year: Never true  ? Ran Out of Food in the Last Year: Never true  ?Transportation Needs: No Transportation Needs  ? Lack of Transportation (Medical): No  ? Lack of Transportation (Non-Medical): No  ?Physical Activity: Sufficiently Active  ? Days of Exercise per Week: 4 days  ? Minutes of Exercise per Session: 60 min  ?Stress: No Stress Concern Present  ? Feeling of Stress : Not at all  ?Social Connections: Socially Integrated  ? Frequency of Communication with Friends and Family: More than three times a week  ? Frequency of Social Gatherings with Friends and Family: More than three times a week  ? Attends Religious Services: More than 4 times per year  ? Active Member of Clubs or Organizations: Yes  ? Attends Archivist Meetings: 1 to 4 times per year  ? Marital Status: Married  ?Intimate Partner Violence: Not on file  ? ? ?Review of Systems: ?See HPI, otherwise negative ROS ? ?Physical Exam: ?BP (!) 150/87   Pulse 70   Temp (!) 96.8 ?F (36 ?C) (Temporal)   Resp 16   Ht 6' 2"  (1.88 m)   Wt 115.7 kg   SpO2 98%   BMI 32.74 kg/m?  ?General:   Alert,  pleasant and cooperative in NAD ?Head:  Normocephalic and atraumatic. ?Neck:  Supple; no masses or thyromegaly. ?Lungs:  Clear throughout to auscultation.    ?Heart:  Regular rate and  rhythm. ?Abdomen:  Soft, nontender and nondistended. Normal bowel sounds, without guarding, and without rebound.   ?Neurologic:  Alert and  oriented x4;  grossly normal neurologically. ? ?Impression/Plan: ?Steven Miranda is here for an colonoscopy to be performed for a history of adenomatous polyps on 2018 ? ?Risks, benefits, limitations, and alternatives regarding  colonoscopy have been reviewed with the patient.  Questions have been answered.  All parties agreeable. ? ? ?Lucilla Lame, MD  12/25/2021, 8:49  AM ?

## 2021-12-25 NOTE — Anesthesia Postprocedure Evaluation (Signed)
Anesthesia Post Note ? ?Patient: Steven Miranda ? ?Procedure(s) Performed: COLONOSCOPY WITH PROPOFOL ? ?Patient location during evaluation: Endoscopy ?Anesthesia Type: General ?Level of consciousness: awake and alert ?Pain management: pain level controlled ?Vital Signs Assessment: post-procedure vital signs reviewed and stable ?Respiratory status: spontaneous breathing, nonlabored ventilation, respiratory function stable and patient connected to nasal cannula oxygen ?Cardiovascular status: blood pressure returned to baseline and stable ?Postop Assessment: no apparent nausea or vomiting ?Anesthetic complications: no ? ? ?No notable events documented. ? ? ?Last Vitals:  ?Vitals:  ? 12/25/21 1010 12/25/21 1020  ?BP: 127/74 130/75  ?Pulse: (!) 51 60  ?Resp: 13 13  ?Temp:    ?SpO2: 99% 98%  ?  ?Last Pain:  ?Vitals:  ? 12/25/21 0950  ?TempSrc: Temporal  ?PainSc:   ? ? ?  ?  ?  ?  ?  ?  ? ?[name] ? ? ? ? ?

## 2021-12-25 NOTE — Transfer of Care (Signed)
Immediate Anesthesia Transfer of Care Note ? ?Patient: Steven Miranda ? ?Procedure(s) Performed: COLONOSCOPY WITH PROPOFOL ? ?Patient Location: PACU and Endoscopy Unit ? ?Anesthesia Type:General ? ?Level of Consciousness: drowsy ? ?Airway & Oxygen Therapy: Patient Spontanous Breathing ? ?Post-op Assessment: Report given to RN ? ?Post vital signs: Reviewed and stable ? ?Last Vitals:  ?Vitals Value Taken Time  ?BP 105/78 12/25/21 0952  ?Temp    ?Pulse 53 12/25/21 0952  ?Resp 12 12/25/21 0952  ?SpO2 96 % 12/25/21 0952  ?Vitals shown include unvalidated device data. ? ?Last Pain:  ?Vitals:  ? 12/25/21 0811  ?TempSrc: Temporal  ?PainSc: 0-No pain  ?   ? ?  ? ?Complications: No notable events documented. ?

## 2021-12-25 NOTE — Anesthesia Preprocedure Evaluation (Signed)
Anesthesia Evaluation  ?Patient identified by MRN, date of birth, ID band ?Patient awake ? ? ? ?Reviewed: ?Allergy & Precautions, NPO status , Patient's Chart, lab work & pertinent test results ? ?History of Anesthesia Complications ?Negative for: history of anesthetic complications ? ?Airway ?Mallampati: III ? ?TM Distance: <3 FB ?Neck ROM: full ? ? ? Dental ? ?(+) Chipped ?  ?Pulmonary ?neg pulmonary ROS, neg shortness of breath, former smoker,  ?  ?Pulmonary exam normal ? ? ? ? ? ? ? Cardiovascular ?Exercise Tolerance: Good ?hypertension, (-) anginaNormal cardiovascular exam ? ? ?  ?Neuro/Psych ? Neuromuscular disease CVA, Residual Symptoms negative psych ROS  ? GI/Hepatic ?negative GI ROS, Neg liver ROS,   ?Endo/Other  ?diabetes, Type 2 ? Renal/GU ?negative Renal ROS  ?negative genitourinary ?  ?Musculoskeletal ? ? Abdominal ?  ?Peds ? Hematology ?negative hematology ROS ?(+)   ?Anesthesia Other Findings ?Past Medical History: ?No date: Actinic keratosis ?No date: Arthritis ?    Comment:  right thumb ?No date: Broken wrist ?No date: Diabetes mellitus without complication ([place]) ?No date: Dysuria ?No date: Hyperlipidemia ?No date: Hypertension ?No date: Wears contact lenses ? ?Past Surgical History: ?2005: COLONOSCOPY ?10/16/2016: COLONOSCOPY WITH PROPOFOL; N/A ?    Comment:  Procedure: COLONOSCOPY WITH PROPOFOL;  Surgeon: Dellis Filbert  ?             Amedeo Kinsman, MD;  Location: [facility] ENDOSCOPY;  Service:  ?             Endoscopy;  Laterality: N/A; ?12/13/2019: ESOPHAGOGASTRODUODENOSCOPY (EGD) WITH PROPOFOL; N/A ?    Comment:  Procedure: ESOPHAGOGASTRODUODENOSCOPY (EGD) WITH BIOPSY; ?             Surgeon: Lucilla Lame, MD;  Location: [place]  ?             CNTR;  Service: Endoscopy;  Laterality: N/A;  Diabetic -  ?             oral meds ?No date: PLANTAR'S WART EXCISION ?No date: STRABISMUS SURGERY; Right ? ?BMI   ? Body Mass Index: 32.74 kg/m?  ?  ? ?  Reproductive/Obstetrics ?negative OB ROS ? ?  ? ? ? ? ? ? ? ? ? ? ? ? ? ?  ?  ? ? ? ? ? ? ? ? ?Anesthesia Physical ?Anesthesia Plan ? ?ASA: 3 ? ?Anesthesia Plan: General  ? ?Post-op Pain Management:   ? ?Induction: Intravenous ? ?PONV Risk Score and Plan: Propofol infusion and TIVA ? ?Airway Management Planned: Natural Airway and Nasal Cannula ? ?Additional Equipment:  ? ?Intra-op Plan:  ? ?Post-operative Plan:  ? ?Informed Consent: I have reviewed the patients History and Physical, chart, labs and discussed the procedure including the risks, benefits and alternatives for the proposed anesthesia with the patient or authorized representative who has indicated his/her understanding and acceptance.  ? ? ? ?Dental Advisory Given ? ?Plan Discussed with: Anesthesiologist, CRNA and Surgeon ? ?Anesthesia Plan Comments: (Patient consented for risks of anesthesia including but not limited to:  ?- adverse reactions to medications ?- risk of airway placement if required ?- damage to eyes, teeth, lips or other oral mucosa ?- nerve damage due to positioning  ?- sore throat or hoarseness ?- Damage to heart, brain, nerves, lungs, other parts of body or loss of life ? ?Patient voiced understanding.)  ? ? ? ? ? ? ?Anesthesia Quick Evaluation ? ?

## 2021-12-26 ENCOUNTER — Ambulatory Visit
Admission: RE | Admit: 2021-12-26 | Discharge: 2021-12-26 | Disposition: A | Discharge status: Home / Self Care | Payer: Medicare HMO | Source: Ambulatory Visit | Attending: Gastroenterology | Admitting: Gastroenterology

## 2021-12-26 ENCOUNTER — Other Ambulatory Visit: Payer: Self-pay | Admitting: Gastroenterology

## 2021-12-26 ENCOUNTER — Telehealth: Payer: Self-pay

## 2021-12-26 ENCOUNTER — Encounter: Payer: Self-pay | Admitting: Gastroenterology

## 2021-12-26 DIAGNOSIS — R109 Unspecified abdominal pain: Secondary | ICD-10-CM

## 2021-12-26 LAB — SURGICAL PATHOLOGY

## 2021-12-26 NOTE — Telephone Encounter (Signed)
I spoke to pt and he states that he does not feel worse and had a BM today... Denies blood in stools... ED precautions given and I will check on pt tomorrow... ? ? ?Per Dr Allen Norris, pt will need repeat colonoscopy in 1 year due to large polyp... Nothing further needed at this time as long as pt continues to improve ?

## 2021-12-26 NOTE — Progress Notes (Signed)
Pt states he is having abdominal pain and distention since yesterday after eating. I called Dr Allen Norris and he is going to call Him at home. ?

## 2021-12-27 ENCOUNTER — Encounter: Payer: Self-pay | Admitting: Gastroenterology

## 2021-12-27 NOTE — Telephone Encounter (Signed)
I spoke to pt and he reports that he feels much better today, the soreness is almost gone. Pt will update [place] if anything changes and he is aware that a recall will be set for a repeat colonoscopy in 1 year ?

## 2022-01-14 ENCOUNTER — Other Ambulatory Visit: Payer: Self-pay | Admitting: Family Medicine

## 2022-01-14 DIAGNOSIS — E119 Type 2 diabetes mellitus without complications: Secondary | ICD-10-CM

## 2022-01-14 NOTE — Telephone Encounter (Signed)
Requested Prescriptions  ?Pending Prescriptions Disp Refills  ?? JARDIANCE 25 MG TABS tablet [Pharmacy Med Name: JARDIANCE 25 MG Tablet] 90 tablet 3  ?  Sig: TAKE 1 TABLET EVERY DAY BEFORE BREAKFAST  ?  ? Endocrinology:  Diabetes - SGLT2 Inhibitors Passed - 01/14/2022  8:56 AM  ?  ?  Passed - Cr in normal range and within 360 days  ?  Creatinine, Ser  ?Date Value Ref Range Status  ?11/14/2021 1.05 0.76 - 1.27 mg/dL Final  ? ?Creatinine, POC  ?Date Value Ref Range Status  ?08/20/2019 1.14 mg/dL Final  ?   ?  ?  Passed - HBA1C is between 0 and 7.9 and within 180 days  ?  HbA1c, POC (prediabetic range)  ?Date Value Ref Range Status  ?08/20/2019 7.9 (A) 5.7 - 6.4 % Corrected  ? ?HbA1c, POC (controlled diabetic range)  ?Date Value Ref Range Status  ?08/20/2019 7.0 0.0 - 7.0 % Corrected  ? ?HbA1c POC (<> result, manual entry)  ?Date Value Ref Range Status  ?08/20/2019 7.9 4.0 - 5.6 % Final  ? ?Hgb A1c MFr Bld  ?Date Value Ref Range Status  ?11/14/2021 6.7 (H) 4.8 - 5.6 % Final  ?  Comment:  ?           Prediabetes: 5.7 - 6.4 ?         Diabetes: >6.4 ?         Glycemic control for adults with diabetes: <7.0 ?  ?   ?  ?  Passed - eGFR in normal range and within 360 days  ?  GFR calc Af Amer  ?Date Value Ref Range Status  ?10/10/2020 78 >59 mL/min/1.73 Final  ?  Comment:  ?  **In accordance with recommendations from the NKF-ASN Task force,** ?  [facility] is in the process of updating its eGFR calculation to the ?  2021 CKD-EPI creatinine equation that estimates kidney function ?  without a race variable. ?  ? ?GFR calc non Af Amer  ?Date Value Ref Range Status  ?10/10/2020 68 >59 mL/min/1.73 Final  ? ?eGFR  ?Date Value Ref Range Status  ?11/14/2021 74 >59 mL/min/1.73 Final  ?   ?  ?  Passed - Valid encounter within last 6 months  ?  Recent Outpatient Visits   ?      ? 2 months ago Encounter for [employer] annual wellness exam  ? [facility] Jerrol Banana., MD  ? 5 months ago URI, acute  ? [facility] Thedore Mins, Ria Comment, PA-C  ? 1 year ago Elevated heart rate with elevated blood pressure and diagnosis of hypertension  ? [facility] [place], [place], [place]  ? 1 year ago Essential (primary) hypertension  ? [place], PA-C  ? 1 year ago Type 2 diabetes mellitus without complication, without long-term current use of insulin ([place])  ? [facility] [place], [place], [place]  ?  ?  ?Future Appointments   ?        ? In 1 month Brendolyn Patty, MD [place]  ? In 4 months Jerrol Banana., MD [facility]  ?  ? ?  ?  ?  ? ?

## 2022-02-07 ENCOUNTER — Other Ambulatory Visit: Payer: Self-pay | Admitting: Family Medicine

## 2022-02-07 DIAGNOSIS — E0800 Diabetes mellitus due to underlying condition with hyperosmolarity without nonketotic hyperglycemic-hyperosmolar coma (NKHHC): Secondary | ICD-10-CM

## 2022-02-19 ENCOUNTER — Encounter: Payer: Self-pay | Admitting: Dermatology

## 2022-02-19 ENCOUNTER — Ambulatory Visit: Payer: Medicare HMO | Admitting: Dermatology

## 2022-02-19 DIAGNOSIS — D1801 Hemangioma of skin and subcutaneous tissue: Secondary | ICD-10-CM

## 2022-02-19 DIAGNOSIS — L578 Other skin changes due to chronic exposure to nonionizing radiation: Secondary | ICD-10-CM

## 2022-02-19 DIAGNOSIS — I781 Nevus, non-neoplastic: Secondary | ICD-10-CM | POA: Diagnosis not present

## 2022-02-19 DIAGNOSIS — L821 Other seborrheic keratosis: Secondary | ICD-10-CM | POA: Diagnosis not present

## 2022-02-19 DIAGNOSIS — L814 Other melanin hyperpigmentation: Secondary | ICD-10-CM

## 2022-02-19 DIAGNOSIS — Q825 Congenital non-neoplastic nevus: Secondary | ICD-10-CM | POA: Diagnosis not present

## 2022-02-19 DIAGNOSIS — D225 Melanocytic nevi of trunk: Secondary | ICD-10-CM

## 2022-02-19 DIAGNOSIS — Z1283 Encounter for screening for malignant neoplasm of skin: Secondary | ICD-10-CM | POA: Diagnosis not present

## 2022-02-19 DIAGNOSIS — D229 Melanocytic nevi, unspecified: Secondary | ICD-10-CM

## 2022-02-19 NOTE — Patient Instructions (Signed)
Recommend daily broad spectrum sunscreen SPF 30+ to sun-exposed areas, reapply every 2 hours as needed. Call for new or changing lesions.  Staying in the shade or wearing long sleeves, sun glasses (UVA+UVB protection) and wide brim hats (4-inch brim around the entire circumference of the hat) are also recommended for sun protection.     Melanoma ABCDEs  Melanoma is the most dangerous type of skin cancer, and is the leading cause of death from skin disease.  You are more likely to develop melanoma if you: Have light-colored skin, light-colored eyes, or red or blond hair Spend a lot of time in the sun Tan regularly, either outdoors or in a tanning bed Have had blistering sunburns, especially during childhood Have a close family member who has had a melanoma Have atypical moles or large birthmarks  Early detection of melanoma is key since treatment is typically straightforward and cure rates are extremely high if we catch it early.   The first sign of melanoma is often a change in a mole or a new dark spot.  The ABCDE system is a way of remembering the signs of melanoma.  A for asymmetry:  The two halves do not match. B for border:  The edges of the growth are irregular. C for color:  A mixture of colors are present instead of an even brown color. D for diameter:  Melanomas are usually (but not always) greater than 43m - the size of a pencil eraser. E for evolution:  The spot keeps changing in size, shape, and color.  Please check your skin once per month between visits. You can use a small mirror in front and a large mirror behind you to keep an eye on the back side or your body.   If you see any new or changing lesions before your next follow-up, please call to schedule a visit.  Please continue daily skin protection including broad spectrum sunscreen SPF 30+ to sun-exposed areas, reapplying every 2 hours as needed when you're outdoors.   Staying in the shade or wearing long sleeves,  sun glasses (UVA+UVB protection) and wide brim hats (4-inch brim around the entire circumference of the hat) are also recommended for sun protection.     If You Need Anything After Your Visit  If you have any questions or concerns for your doctor, please call our main line at [phone] press option 4 to reach your doctor's medical assistant. If no one answers, please leave a voicemail as directed and we will return your call as soon as possible. Messages left after 4 pm will be answered the following business day.   You may also send [place] message via [place] We typically respond to MyChart messages within 1-2 business days.  For prescription refills, please ask your pharmacy to contact our office. Our fax number is [phone]  If you have an urgent issue when the clinic is closed that cannot wait until the next business day, you can page your doctor at the number below.    Please note that while we do our best to be available for urgent issues outside of office hours, we are not available 24/7.   If you have an urgent issue and are unable to reach [place] you may choose to seek medical care at your doctor's office, retail clinic, urgent care center, or emergency room.  If you have a medical emergency, please immediately call 911 or go to the emergency department.  Pager Numbers  - Dr. KNehemiah Massed  [phone]  - Dr. Laurence Ferrari: [phone]  - Dr. Nicole Kindred: [phone]  In the event of inclement weather, please call our main line at [phone] for an update on the status of any delays or closures.  Dermatology Medication Tips: Please keep the boxes that topical medications come in in order to help keep track of the instructions about where and how to use these. Pharmacies typically print the medication instructions only on the boxes and not directly on the medication tubes.   If your medication is too expensive, please contact our office at [phone] option 4 or send [place] a message  through [place].   We are unable to tell what your co-pay for medications will be in advance as this is different depending on your insurance coverage. However, we may be able to find a substitute medication at lower cost or fill out paperwork to get insurance to cover a needed medication.   If a prior authorization is required to get your medication covered by your insurance company, please allow [place] 1-2 business days to complete this process.  Drug prices often vary depending on where the prescription is filled and some pharmacies may offer cheaper prices.  The website www.goodrx.com contains coupons for medications through different pharmacies. The prices here do not account for what the cost may be with help from insurance (it may be cheaper with your insurance), but the website can give you the price if you did not use any insurance.  - You can print the associated coupon and take it with your prescription to the pharmacy.  - You may also stop by our office during regular business hours and pick up a GoodRx coupon card.  - If you need your prescription sent electronically to a different pharmacy, notify our office through [facility] or by phone at [phone] option 4.     Si Usted Necesita Algo Despus de Su Visita  Tambin puede enviarnos un mensaje a travs de Pharmacist, community. Por lo general respondemos a los mensajes de MyChart en el transcurso de 1 a 2 das hbiles.  Para renovar recetas, por favor pida a su farmacia que se ponga en contacto con nuestra oficina. Harland Dingwall de fax es [place] [phone].  Si tiene un asunto urgente cuando la clnica est cerrada y que no puede esperar hasta el siguiente da hbil, puede llamar/localizar a su doctor(a) al nmero que aparece a continuacin.   Por favor, tenga en cuenta que aunque hacemos todo lo posible para estar disponibles para asuntos urgentes fuera del horario de [place], no estamos disponibles las 24 horas del da, los 7 das de  la [place].   Si tiene un problema urgente y no puede comunicarse con nosotros, puede optar por buscar atencin mdica  en el consultorio de su doctor(a), en una clnica privada, en un centro de atencin urgente o en una sala de emergencias.  Si tiene Engineering geologist, por favor llame inmediatamente al 911 o vaya a la sala de emergencias.  Nmeros de bper  - Dr. Nehemiah Massed: [phone]  - Dra. Moye: [phone]  - Dra. Nicole Kindred: [phone]  En caso de inclemencias del [place], por favor llame a Johnsie Kindred principal al [phone] para una actualizacin sobre el [place] de cualquier retraso o cierre.  Consejos para la medicacin en dermatologa: Por favor, guarde las cajas en las que vienen los medicamentos de uso tpico para ayudarle a seguir las instrucciones sobre dnde y cmo usarlos. Las farmacias generalmente imprimen las instrucciones del medicamento slo en  las cajas y no directamente en los tubos del [place].   Si su medicamento es muy caro, por favor, pngase en contacto con Zigmund Daniel llamando al [phone] y presione la opcin 4 o envenos un mensaje a travs de Pharmacist, community.   No podemos decirle cul ser su copago por los medicamentos por adelantado ya que esto es diferente dependiendo de la cobertura de su seguro. Sin embargo, es posible que podamos encontrar un medicamento sustituto a Electrical engineer un formulario para que el seguro cubra el medicamento que se considera necesario.   Si se requiere una autorizacin previa para que su compaa de seguros [place] su medicamento, por favor permtanos de 1 a 2 das hbiles para completar este proceso.  Los precios de los medicamentos varan con frecuencia dependiendo del Environmental consultant de dnde se surte la receta y alguna farmacias pueden ofrecer precios ms baratos.  El sitio web www.goodrx.com tiene cupones para medicamentos de Airline pilot. Los precios aqu no tienen en cuenta lo que podra costar con la ayuda  del seguro (puede ser ms barato con su seguro), pero el sitio web puede darle el precio si no utiliz Research scientist (physical sciences).  - Puede imprimir el cupn correspondiente y llevarlo con su receta a la farmacia.  - Tambin puede pasar por nuestra oficina durante el horario de atencin regular y Charity fundraiser una tarjeta de cupones de GoodRx.  - Si necesita que su receta se enve electrnicamente a una farmacia diferente, informe a nuestra oficina a travs de MyChart de [place] o por telfono llamando al [phone] y presione la opcin 4.

## 2022-02-19 NOTE — Progress Notes (Signed)
   Follow-Up Visit   Subjective  Steven Miranda is a 76 y.o. male who presents for the following: Annual Exam (Skin cancer screening. Full body. Hx of AK's. No personal Hx of skin cancer).  The patient presents for Upper Body Skin Exam (UBSE) for skin cancer screening and mole check.  The patient has spots, moles and lesions to be evaluated, some may be new or changing and the patient has concerns that these could be cancer.   The following portions of the chart were reviewed this encounter and updated as appropriate:      Review of Systems: No other skin or systemic complaints except as noted in HPI or Assessment and Plan.   Objective  Well appearing patient in no apparent distress; mood and affect are within normal limits.  All skin waist up examined.  Left Lower Back 0.5 cm medium brown macule.    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 - Call for any changes  Seborrheic Keratoses. Left zygoma, right postauricular scalp, forehead, back. - Stuck-on, waxy, tan-brown papules and/or plaques  - Benign-appearing - Discussed benign etiology and prognosis. - Observe - Call for any changes  Melanocytic Nevi - Tan-brown and/or pink-flesh-colored symmetric macules and papules - Benign appearing on exam today - Observation - Call clinic for new or changing moles - Recommend daily use of broad spectrum spf 30+ sunscreen to sun-exposed areas.   Hemangiomas. Arms, hands, face. - Red papules - Discussed benign nature - Observe - Call for any changes  Actinic Damage - Chronic condition, secondary to cumulative UV/sun exposure - diffuse scaly erythematous macules with underlying dyspigmentation - Recommend daily broad spectrum  sunscreen SPF 30+ to sun-exposed areas, reapply every 2 hours as needed.  - Staying in the shade or wearing long sleeves, sun glasses (UVA+UVB protection) and wide brim hats (4-inch brim around the entire circumference of the hat) are also recommended for sun protection.  - Call for new or changing lesions.  Skin cancer screening performed today.  Telangiectasias. Cheeks.  - Dilated blood vessel - Benign appearing on exam - Call for changes   Nevus Left Lower Back  Benign-appearing.  Stable. Observation.  Call clinic for new or changing lesions.  Recommend daily use of broad spectrum spf 30+ sunscreen to sun-exposed areas.    Congenital non-neoplastic nevus Left Upper Arm  Benign-appearing.  Stable. Observation.  Call clinic for new or changing lesions.  Recommend daily use of broad spectrum spf 30+ sunscreen to sun-exposed areas.     Return in about 1 year (around 02/20/2023) for UBSE.  I, Emelia Salisbury, CMA, am acting as scribe for Brendolyn Patty, MD.  Documentation: I have reviewed the above documentation for accuracy and completeness, and I agree with the above.  Brendolyn Patty MD

## 2022-02-27 ENCOUNTER — Other Ambulatory Visit: Payer: Self-pay

## 2022-02-27 ENCOUNTER — Encounter: Payer: Self-pay | Admitting: Family Medicine

## 2022-02-27 MED ORDER — TRUE METRIX BLOOD GLUCOSE TEST VI STRP: ORAL_STRIP | 12 refills | Status: AC

## 2022-03-07 DIAGNOSIS — J029 Acute pharyngitis, unspecified: Secondary | ICD-10-CM | POA: Diagnosis not present

## 2022-03-07 DIAGNOSIS — R0981 Nasal congestion: Secondary | ICD-10-CM | POA: Diagnosis not present

## 2022-03-07 DIAGNOSIS — J0100 Acute maxillary sinusitis, unspecified: Secondary | ICD-10-CM | POA: Diagnosis not present

## 2022-03-24 ENCOUNTER — Emergency Department
Admission: EM | Admit: 2022-03-24 | Discharge: 2022-03-25 | Disposition: A | Discharge status: Home / Self Care | Payer: Medicare HMO | Attending: Emergency Medicine | Admitting: Emergency Medicine

## 2022-03-24 ENCOUNTER — Other Ambulatory Visit: Payer: Self-pay

## 2022-03-24 ENCOUNTER — Emergency Department: Payer: Medicare HMO

## 2022-03-24 DIAGNOSIS — R0789 Other chest pain: Secondary | ICD-10-CM | POA: Diagnosis not present

## 2022-03-24 DIAGNOSIS — R079 Chest pain, unspecified: Secondary | ICD-10-CM

## 2022-03-24 DIAGNOSIS — E119 Type 2 diabetes mellitus without complications: Secondary | ICD-10-CM | POA: Diagnosis not present

## 2022-03-24 DIAGNOSIS — Z7984 Long term (current) use of oral hypoglycemic drugs: Secondary | ICD-10-CM | POA: Insufficient documentation

## 2022-03-24 DIAGNOSIS — I4891 Unspecified atrial fibrillation: Secondary | ICD-10-CM | POA: Diagnosis not present

## 2022-03-24 DIAGNOSIS — I10 Essential (primary) hypertension: Secondary | ICD-10-CM | POA: Insufficient documentation

## 2022-03-24 DIAGNOSIS — R0600 Dyspnea, unspecified: Secondary | ICD-10-CM | POA: Diagnosis not present

## 2022-03-24 DIAGNOSIS — Z79899 Other long term (current) drug therapy: Secondary | ICD-10-CM | POA: Diagnosis not present

## 2022-03-24 LAB — COMPREHENSIVE METABOLIC PANEL
ALT: 22 U/L (ref 0–44)
AST: 32 U/L (ref 15–41)
Albumin: 4.6 g/dL (ref 3.5–5.0)
Alkaline Phosphatase: 69 U/L (ref 38–126)
Anion gap: 8 (ref 5–15)
BUN: 21 mg/dL (ref 8–23)
CO2: 22 mmol/L (ref 22–32)
Calcium: 9.7 mg/dL (ref 8.9–10.3)
Chloride: 110 mmol/L (ref 98–111)
Creatinine, Ser: 0.99 mg/dL (ref 0.61–1.24)
GFR, Estimated: >60 mL/min (ref >60)
Glucose, Bld: 115 mg/dL — ABNORMAL HIGH (ref 70–99)
Potassium: 4.7 mmol/L (ref 3.5–5.1)
Sodium: 140 mmol/L (ref 135–145)
Total Bilirubin: 1.3 mg/dL — ABNORMAL HIGH (ref 0.3–1.2)
Total Protein: 8.2 g/dL — ABNORMAL HIGH (ref 6.5–8.1)

## 2022-03-24 LAB — TROPONIN I (HIGH SENSITIVITY)
Troponin I (High Sensitivity): 4 ng/L (ref <18)
Troponin I (High Sensitivity): 4 ng/L (ref <18)

## 2022-03-24 LAB — CBC
HCT: 46.4 % (ref 39.0–52.0)
Hemoglobin: 15.7 g/dL (ref 13.0–17.0)
MCH: 31.4 pg (ref 26.0–34.0)
MCHC: 33.8 g/dL (ref 30.0–36.0)
MCV: 92.8 fL (ref 80.0–100.0)
Platelets: 270 10*3/uL (ref 150–400)
RBC: 5 MIL/uL (ref 4.22–5.81)
RDW: 12.9 % (ref 11.5–15.5)
WBC: 5.9 10*3/uL (ref 4.0–10.5)
nRBC: 0 % (ref 0.0–0.2)

## 2022-03-24 MED ORDER — ASPIRIN 81 MG PO CHEW
324.0000 mg | CHEWABLE_TABLET | Freq: Once | ORAL | Status: AC
  Administered 2022-03-25: 324 mg via ORAL
  Filled 2022-03-24: qty 4

## 2022-03-24 NOTE — ED Triage Notes (Signed)
Pt states he was working in the yard tonight when he began to experience palpitations, shortness of breath and central chest pain. Pt with a fib noted on ekg, pt states no known history of a fib.

## 2022-03-25 LAB — TSH: TSH: 4.026 u[IU]/mL (ref 0.350–4.500)

## 2022-03-25 LAB — T4, FREE: Free T4: 1 ng/dL (ref 0.61–1.12)

## 2022-03-26 ENCOUNTER — Encounter: Payer: Self-pay | Admitting: Family Medicine

## 2022-03-27 ENCOUNTER — Encounter: Payer: Self-pay | Admitting: Medical

## 2022-03-27 ENCOUNTER — Encounter: Payer: Self-pay | Admitting: Family Medicine

## 2022-03-27 ENCOUNTER — Ambulatory Visit (INDEPENDENT_AMBULATORY_CARE_PROVIDER_SITE_OTHER): Payer: Medicare HMO

## 2022-03-27 ENCOUNTER — Ambulatory Visit: Payer: Medicare HMO | Admitting: Medical

## 2022-03-27 VITALS — BP 122/72 | HR 62 | Ht 74.0 in | Wt 258.2 lb

## 2022-03-27 DIAGNOSIS — R0789 Other chest pain: Secondary | ICD-10-CM | POA: Diagnosis not present

## 2022-03-27 DIAGNOSIS — I480 Paroxysmal atrial fibrillation: Secondary | ICD-10-CM

## 2022-03-27 MED ORDER — APIXABAN 5 MG PO TABS: 5.0000 mg | ORAL_TABLET | Freq: Two times a day (BID) | ORAL | 0 refills | Status: DC

## 2022-03-27 NOTE — Progress Notes (Signed)
Cardiology Office Note:    Date:  03/27/2022   ID:  Steven Miranda November 16, 1945, MRN [id number]  PCP:  Jerrol Banana., MD  [facility] Cardiologist:  Kate Sable, MD  [facility] Electrophysiologist:  None   Referring MD: Jerrol Banana.,*   Chief Complaint: ER follow-up  History of Present Illness:    Steven Miranda is a 76 y.o. male with a hx of  hypertension, hyperlipidemia, Barrett's esophagus, and diabetes who presents for ER follow-up.   He was initially referred in 09/2020 for tachycardia and rapid heart beats.   Last seen 12/2020 and was overall doing well.   ER visit 03/24/22 for chest pain and palpitations. EKG showed rate controlled Afib. Work-up was otherwise normal. He was discharged with hospital follow-up.   Today, the patient reports he was feeling very poorly when he went into the ER. He noted on his apple watch fluctuating heart rates. He feels like he has severe heart burn. No chest pain. No shortness of breath. He is in normal rhythm. He has not had PPI in about a year. He started back on Protonix today. No Lower leg edema.HE goes to the gym regularly.   Past Medical History:  Diagnosis Date   Actinic keratosis    Arthritis    right thumb   Broken wrist    Diabetes mellitus without complication ([place])    Dysuria    Hyperlipidemia    Hypertension    Wears contact lenses     Past Surgical History:  Procedure Laterality Date   COLONOSCOPY  2005   COLONOSCOPY WITH PROPOFOL N/A 10/16/2016   Procedure: COLONOSCOPY WITH PROPOFOL;  Surgeon: Robert Bellow, MD;  Location: [facility] ENDOSCOPY;  Service: Endoscopy;  Laterality: N/A;   COLONOSCOPY WITH PROPOFOL N/A 12/25/2021   Procedure: COLONOSCOPY WITH PROPOFOL;  Surgeon: Lucilla Lame, MD;  Location: [facility] ENDOSCOPY;  Service: Endoscopy;  Laterality: N/A;   ESOPHAGOGASTRODUODENOSCOPY (EGD) WITH PROPOFOL N/A 12/13/2019   Procedure: ESOPHAGOGASTRODUODENOSCOPY (EGD) WITH BIOPSY;  Surgeon:  Lucilla Lame, MD;  Location: [place];  Service: Endoscopy;  Laterality: N/A;  Diabetic - oral meds   PLANTAR'S WART EXCISION     STRABISMUS SURGERY Right     Current Medications: Current Meds  Medication Sig   ACCU-CHEK SMARTVIEW test strip CHECK  FASTING  BLOOD  SUGAR EVERY MORNING AS INSTRUCTED   amLODipine (NORVASC) 5 MG tablet Take 1 tablet (5 mg total) by mouth at bedtime.   apixaban (ELIQUIS) 5 MG TABS tablet Take 1 tablet (5 mg total) by mouth 2 (two) times daily.   atorvastatin (LIPITOR) 20 MG tablet Take 1 tablet (20 mg total) by mouth daily.   Blood Glucose Monitoring Suppl (ACCU-CHEK NANO SMARTVIEW) w/Device KIT Use glucometer (Accu-Chek Nano Glucometer) to test fasting glucose daily.   Ferrous Sulfate (IRON SUPPLEMENT PO) Take 1 tablet by mouth daily.   fluticasone (FLONASE) 50 MCG/ACT nasal spray Place into both nostrils as needed.   glucose blood (TRUE METRIX BLOOD GLUCOSE TEST) test strip Use as instructed   hyoscyamine (LEVSIN SL) 0.125 MG SL tablet Place 1 tablet (0.125 mg total) under the tongue every 6 (six) hours as needed.   JARDIANCE 25 MG TABS tablet TAKE 1 TABLET EVERY DAY BEFORE BREAKFAST   Lancets (ONETOUCH ULTRASOFT) lancets Use as instructed once a day for fasting glucose check.   lidocaine (XYLOCAINE) 2 % solution Mix 5 ml with 5 ml of liquid antacid for acute indigetion pain and swallow. May  repeat 2-3 times in a day if needed.   metFORMIN (GLUCOPHAGE) 1000 MG tablet TAKE 1 TABLET TWICE DAILY WITH MEALS   Misc Natural Products (OSTEO BI-FLEX ADV DOUBLE ST) CAPS Take 1 capsule by mouth daily.   pantoprazole (PROTONIX) 40 MG tablet TAKE 1 TABLET EVERY DAY   pioglitazone (ACTOS) 15 MG tablet Take 1 tablet (15 mg total) by mouth daily.   Probiotic Product (PROBIOTIC FORMULA) CAPS Take by mouth daily.   sucralfate (CARAFATE) 1 g tablet Take 1 tablet (1 g total) by mouth 4 (four) times daily -  with meals and at bedtime.     Allergies:   Patient has no  known allergies.   Social History   Socioeconomic History   Marital status: Married    Spouse name: Not on file   Number of children: 3   Years of education: Not on file   Highest education level: Bachelor's degree (e.g., BA, AB, BS)  Occupational History   Not on file  Tobacco Use   Smoking status: Former    Types: Cigarettes    Quit date: 1978    Years since quitting: 45.5   Smokeless tobacco: Never   Tobacco comments:    quit in 1978  Vaping Use   Vaping Use: Never used  Substance and Sexual Activity   Alcohol use: Yes    Alcohol/week: 6.0 standard drinks of alcohol    Types: 6 Cans of beer per week    Comment: a couple beers on the weekends   Drug use: No   Sexual activity: Not on file  Other Topics Concern   Not on file  Social History Narrative   Not on file   Social Determinants of Health   Financial Resource Strain: Low Risk  (08/03/2021)   Overall Financial Resource Strain (CARDIA)    Difficulty of Paying Living Expenses: Not hard at all  Food Insecurity: No Food Insecurity (08/03/2021)   Hunger Vital Sign    Worried About Running Out of Food in the Last Year: Never true    Ran Out of Food in the Last Year: Never true  Transportation Needs: No Transportation Needs (08/03/2021)   PRAPARE - Hydrologist (Medical): No    Lack of Transportation (Non-Medical): No  Physical Activity: Sufficiently Active (08/03/2021)   Exercise Vital Sign    Days of Exercise per Week: 4 days    Minutes of Exercise per Session: 60 min  Stress: No Stress Concern Present (08/03/2021)   [place]    Feeling of Stress : Not at all  Social Connections: [place] (08/03/2021)   Social Connection and Isolation Panel [NHANES]    Frequency of Communication with Friends and Family: More than three times a week    Frequency of Social Gatherings with Friends and Family: More than three  times a week    Attends Religious Services: More than 4 times per year    Active Member of [employer] or Organizations: Yes    Attends Archivist Meetings: 1 to 4 times per year    Marital Status: Married     Family History: The patient's family history includes Alcohol abuse in his father; Cancer in his mother; Diabetes in his paternal grandmother; Emphysema in his paternal grandfather; Heart disease in his maternal uncle; Rheumatic fever in his maternal uncle.  ROS:   Please see the history of present illness.     All other  systems reviewed and are negative.  EKGs/Labs/Other Studies Reviewed:    The following studies were reviewed today:  N/A  EKG:  EKG is  ordered today.  The ekg ordered today demonstrates NSR 62bpm, nonspecific T wave changes  Recent Labs: 03/24/2022: ALT 22; BUN 21; Creatinine, Ser 0.99; Hemoglobin 15.7; Platelets 270; Potassium 4.7; Sodium 140; TSH 4.026  Recent Lipid Panel    Component Value Date/Time   CHOL 157 11/14/2021 1122   TRIG 87 11/14/2021 1122   HDL 67 11/14/2021 1122   CHOLHDL 2.3 11/14/2021 1122   LDLCALC 74 11/14/2021 1122    Physical Exam:    VS:  BP 122/72 (BP Location: Left Arm, Patient Position: Sitting, Cuff Size: Large)   Pulse 62   Ht 6' 2" (1.88 m)   Wt 258 lb 3.2 oz (117.1 kg)   SpO2 95%   BMI 33.15 kg/m     Wt Readings from Last 3 Encounters:  03/27/22 258 lb 3.2 oz (117.1 kg)  03/24/22 255 lb (115.7 kg)  12/25/21 255 lb (115.7 kg)     GEN:  Well nourished, well developed in no acute distress HEENT: Normal NECK: No JVD; No carotid bruits LYMPHATICS: No lymphadenopathy CARDIAC: RRR, no murmurs, rubs, gallops RESPIRATORY:  Clear to auscultation without rales, wheezing or rhonchi  ABDOMEN: Soft, non-tender, non-distended MUSCULOSKELETAL:  No edema; No deformity  SKIN: Warm and dry NEUROLOGIC:  Alert and oriented x 3 PSYCHIATRIC:  Normal affect   ASSESSMENT:    1. PAF (paroxysmal atrial fibrillation) ([place])    2. Atypical chest pain    PLAN:    In order of problems listed above:  New onset Afib New Afib found at recent ER visit. The patient had been feeling poorly and noted fluctuating heart rate. In the ER EKG showed Afib with controlled rates in the 80s. Otherwise work-up was unremarkable. Today he is NSR with rates in the 60s. According to [facility] of at least 3 (age, HTN, DM2) he would qualify for long-term anticoagulation. We dicussed starting Eiquis 25m BID, however he would like to wait given it was one occurrence of afib. He would like to speak to his PCP prior to starting, I will also discuss with primary cardiologist. I will also get a 2 week heart monitor to monitor for reoccurrence. I will order a baseline echo as well. I will wait to add rate controlling therapy until after the heart monitor given HR of 62bpm today.   Atypical chest pain  He reports chest burning, which may be from Barrett's esophagus, he has been off PPI for a year and recently restarted it. No prior CAD history. If chest burning dose not improve we can consider Cardiac CT vs stress test.    Disposition: Follow up in 6-8 weeks week(s) with MD/APP    Signed, Cadence HNinfa Meeker PA-C  03/27/2022 3:55 PM    [place]

## 2022-03-27 NOTE — Patient Instructions (Signed)
Medication Instructions:  Your physician has recommended you make the following change in your medication:   START Eliquis 5 mg twice a day. An Rx has been sent to your mail order pharmacy.  You have been provided with samples today.  *If you need a refill on your cardiac medications before your next appointment, please call your pharmacy*   Lab Work: Your physician recommends that you return for lab work (Cbc) in: 2 weeks  [facility] No [facility]t needed. Lab hours are Mon-Fri 7am-6pm. Stop at the Registration desk to check in.  If you have labs (blood work) drawn today and your tests are completely normal, you will receive your results only by: [place] (if you have MyChart) OR A paper copy in the mail If you have any lab test that is abnormal or we need to change your treatment, we will call you to review the results.   Testing/Procedures: Your physician has requested that you have an echocardiogram. Echocardiography is a painless test that uses sound waves to create images of your heart. It provides your doctor with information about the size and shape of your heart and how well your heart's chambers and valves are working. This procedure takes approximately one hour. There are no restrictions for this procedure.  Your provider has ordered a heart monitor to wear for 14 days. This will be mailed to your home with instructions on placement. Once you have finished the time frame requested, you will return monitor in box provided.      Follow-Up: At [facility], you and your health needs are our priority.  As part of our continuing mission to provide you with exceptional heart care, we have created designated Provider Care Teams.  These Care Teams include your primary Cardiologist (physician) and Advanced Practice Providers (APPs -  Physician Assistants and Nurse Practitioners) who all work together to provide you with the care you need,  when you need it.  We recommend signing up for the patient portal called "MyChart".  Sign up information is provided on this After Visit Summary.  MyChart is used to connect with patients for Virtual Visits (Telemedicine).  Patients are able to view lab/test results, encounter notes, upcoming appointments, etc.  Non-urgent messages can be sent to your provider as well.   To learn more about what you can do with MyChart, go to [email or web address].    Your next appointment:   6-8 week(s)  The format for your next appointment:   In Person  Provider:   You may see Kate Sable, MD or one of the following Advanced Practice Providers on your designated Care Team:   Murray Hodgkins, NP {     Other Instructions N/A  Important Information About Sugar

## 2022-03-29 ENCOUNTER — Ambulatory Visit (INDEPENDENT_AMBULATORY_CARE_PROVIDER_SITE_OTHER): Payer: Medicare HMO

## 2022-03-29 DIAGNOSIS — I480 Paroxysmal atrial fibrillation: Secondary | ICD-10-CM | POA: Diagnosis not present

## 2022-03-29 LAB — ECHOCARDIOGRAM COMPLETE
AR max vel: 3.7 cm2
AV Area VTI: 3.99 cm2
AV Area mean vel: 3.55 cm2
AV Mean grad: 3 mmHg
AV Peak grad: 5.6 mmHg
Ao pk vel: 1.18 m/s
Area-P 1/2: 2.45 cm2
Calc EF: 50.9 %
S' Lateral: 3.7 cm
Single Plane A2C EF: 51.7 %
Single Plane A4C EF: 50.5 %

## 2022-04-01 ENCOUNTER — Other Ambulatory Visit: Payer: Self-pay | Admitting: Family Medicine

## 2022-04-01 ENCOUNTER — Other Ambulatory Visit: Payer: Self-pay | Admitting: Gastroenterology

## 2022-04-01 DIAGNOSIS — I10 Essential (primary) hypertension: Secondary | ICD-10-CM

## 2022-04-01 DIAGNOSIS — R1013 Epigastric pain: Secondary | ICD-10-CM

## 2022-04-03 ENCOUNTER — Ambulatory Visit (INDEPENDENT_AMBULATORY_CARE_PROVIDER_SITE_OTHER): Payer: Medicare HMO | Admitting: Family Medicine

## 2022-04-03 ENCOUNTER — Encounter: Payer: Self-pay | Admitting: Family Medicine

## 2022-04-03 VITALS — BP 111/70 | HR 63 | Temp 97.7°F | Resp 16 | Wt 256.6 lb

## 2022-04-03 DIAGNOSIS — E860 Dehydration: Secondary | ICD-10-CM

## 2022-04-03 DIAGNOSIS — K30 Functional dyspepsia: Secondary | ICD-10-CM | POA: Diagnosis not present

## 2022-04-03 DIAGNOSIS — R000 Tachycardia, unspecified: Secondary | ICD-10-CM | POA: Diagnosis not present

## 2022-04-03 DIAGNOSIS — E119 Type 2 diabetes mellitus without complications: Secondary | ICD-10-CM | POA: Diagnosis not present

## 2022-04-03 DIAGNOSIS — E7800 Pure hypercholesterolemia, unspecified: Secondary | ICD-10-CM

## 2022-04-03 DIAGNOSIS — I10 Essential (primary) hypertension: Secondary | ICD-10-CM | POA: Diagnosis not present

## 2022-04-03 NOTE — Progress Notes (Unsigned)
I,Sulibeya S Dimas,acting as a scribe for Wilhemena Durie, MD.,have documented all relevant documentation on the behalf of Wilhemena Durie, MD,as directed by  Wilhemena Durie, MD while in the presence of Wilhemena Durie, MD.   Established patient visit   Patient: Steven Miranda   DOB: Sep 08, 1946   76 y.o. Male  MRN: [id number] Visit Date: 04/03/2022  Today's healthcare provider: Wilhemena Durie, MD   Chief Complaint  Patient presents with   Atrial Fibrillation   Subjective    HPI  Patient was dehydrated and seen in the emergency room and evaluated.  He is feeling much better from this. He has had 1 episode of A-fib which seems to be of documented.  He has had no recurrences and is reluctant to start Eliquis after just 1 episode.  His [place] vas score is 3  Follow up ER visit  Patient was seen in ER for chest pain on 03/24/22. He was treated for Afib. Treatment for this included labs, chest x-ray. Referral to cardio.       He reports excellent compliance with treatment. He reports this condition is Improved.  Patient was seen by Cadence Furth, PA-C on 03/27/22 and was advised to start Eliquis 73m twice daily. Patient is to wear Zio heart monitor for 14 days. Patient has not started Eliquis.   -----------------------------------------------------------------------------------------   Medications: Outpatient Medications Prior to Visit  Medication Sig   ACCU-CHEK SMARTVIEW test strip CHECK  FASTING  BLOOD  SUGAR EVERY MORNING AS INSTRUCTED   amLODipine (NORVASC) 5 MG tablet TAKE 1 TABLET AT BEDTIME   atorvastatin (LIPITOR) 20 MG tablet Take 1 tablet (20 mg total) by mouth daily.   Blood Glucose Monitoring Suppl (ACCU-CHEK NANO SMARTVIEW) w/Device KIT Use glucometer (Accu-Chek Nano Glucometer) to test fasting glucose daily.   Ferrous Sulfate (IRON SUPPLEMENT PO) Take 1 tablet by mouth daily.   fluticasone (FLONASE) 50 MCG/ACT nasal spray Place into both nostrils  as needed.   glucose blood (TRUE METRIX BLOOD GLUCOSE TEST) test strip Use as instructed   hyoscyamine (LEVSIN SL) 0.125 MG SL tablet Place 1 tablet (0.125 mg total) under the tongue every 6 (six) hours as needed.   JARDIANCE 25 MG TABS tablet TAKE 1 TABLET EVERY DAY BEFORE BREAKFAST   Lancets (ONETOUCH ULTRASOFT) lancets Use as instructed once a day for fasting glucose check.   lidocaine (XYLOCAINE) 2 % solution Mix 5 ml with 5 ml of liquid antacid for acute indigetion pain and swallow. May repeat 2-3 times in a day if needed.   metFORMIN (GLUCOPHAGE) 1000 MG tablet TAKE 1 TABLET TWICE DAILY WITH MEALS   Misc Natural Products (OSTEO BI-FLEX ADV DOUBLE ST) CAPS Take 1 capsule by mouth daily.   pantoprazole (PROTONIX) 40 MG tablet TAKE 1 TABLET EVERY DAY   pioglitazone (ACTOS) 15 MG tablet Take 1 tablet (15 mg total) by mouth daily.   Probiotic Product (PROBIOTIC FORMULA) CAPS Take by mouth daily.   sucralfate (CARAFATE) 1 g tablet Take 1 tablet (1 g total) by mouth 4 (four) times daily -  with meals and at bedtime.   apixaban (ELIQUIS) 5 MG TABS tablet Take 1 tablet (5 mg total) by mouth 2 (two) times daily. (Patient not taking: Reported on 04/03/2022)   No facility-administered medications prior to visit.    Review of Systems  Constitutional:  Negative for fatigue.  Respiratory:  Negative for chest tightness and shortness of breath.   Cardiovascular:  Negative for  chest pain, palpitations and leg swelling.  Gastrointestinal:  Negative for abdominal pain, diarrhea, nausea and vomiting.  Neurological:  Negative for headaches.    Last CBC Lab Results  Component Value Date   WBC 5.9 03/24/2022   HGB 15.7 03/24/2022   HCT 46.4 03/24/2022   MCV 92.8 03/24/2022   MCH 31.4 03/24/2022   RDW 12.9 03/24/2022   PLT 270 85/27/7824   Last metabolic panel Lab Results  Component Value Date   GLUCOSE 115 (H) 03/24/2022   NA 140 03/24/2022   K 4.7 03/24/2022   CL 110 03/24/2022   CO2 22  03/24/2022   BUN 21 03/24/2022   CREATININE 0.99 03/24/2022   GFRNONAA >60 03/24/2022   CALCIUM 9.7 03/24/2022   PROT 8.2 (H) 03/24/2022   ALBUMIN 4.6 03/24/2022   LABGLOB 2.7 11/14/2021   AGRATIO 1.8 11/14/2021   BILITOT 1.3 (H) 03/24/2022   ALKPHOS 69 03/24/2022   AST 32 03/24/2022   ALT 22 03/24/2022   ANIONGAP 8 03/24/2022   Last lipids Lab Results  Component Value Date   CHOL 157 11/14/2021   HDL 67 11/14/2021   LDLCALC 74 11/14/2021   TRIG 87 11/14/2021   CHOLHDL 2.3 11/14/2021   Last hemoglobin A1c Lab Results  Component Value Date   HGBA1C 6.7 (H) 11/14/2021   Last thyroid functions Lab Results  Component Value Date   TSH 4.026 03/24/2022       Objective    BP 111/70 (BP Location: Left Arm, Patient Position: Sitting, Cuff Size: Large)   Pulse 63   Temp 97.7 F (36.5 C) (Oral)   Resp 16   Wt 256 lb 9.6 oz (116.4 kg)   SpO2 96%   BMI 32.95 kg/m  BP Readings from Last 3 Encounters:  04/03/22 111/70  03/27/22 122/72  03/25/22 139/81   Wt Readings from Last 3 Encounters:  04/03/22 256 lb 9.6 oz (116.4 kg)  03/27/22 258 lb 3.2 oz (117.1 kg)  03/24/22 255 lb (115.7 kg)      Physical Exam Vitals reviewed.  Constitutional:      General: He is not in acute distress.    Appearance: He is well-developed.  HENT:     Head: Normocephalic and atraumatic.     Right Ear: Hearing normal.     Left Ear: Hearing normal.     Nose: Nose normal.  Eyes:     General: Lids are normal. No scleral icterus.       Right eye: No discharge.        Left eye: No discharge.     Conjunctiva/sclera: Conjunctivae normal.  Cardiovascular:     Rate and Rhythm: Normal rate and regular rhythm.     Heart sounds: Normal heart sounds.  Pulmonary:     Effort: Pulmonary effort is normal. No respiratory distress.  Skin:    General: Skin is warm and dry.     Findings: No lesion or rash.  Neurological:     General: No focal deficit present.     Mental Status: He is alert and  oriented to person, place, and time.  Psychiatric:        Mood and Affect: Mood normal.        Speech: Speech normal.        Behavior: Behavior normal.        Thought Content: Thought content normal.        Judgment: Judgment normal.       No results found for any  visits on 04/03/22.  Assessment & Plan     1. Dehydration Patient feeling much better from his dehydration and ED visit from working in extreme heat.  2. Type 2 diabetes mellitus without complication, without long-term current use of insulin (HCC) Goal A1c less than 7-7.5 per Evidently Jardiance is expensive and Wilder Glade is not covered.  Evidently Vergia Alcon is covered.  Follow-up in 3 months  3. Tachycardia Patient had 1 episode of A-fib.  With this he has a Zio patch planned and follow-up with cardiology.  I think it is fine to not take the Eliquis until after he sees cardiology after the patch/monitor review  4. Essential (primary) hypertension Good control  5. Pure hypercholesterolemia On atorvastatin  6. Acid indigestion On pantoprazole   No follow-ups on file.      I, Wilhemena Durie, MD, have reviewed all documentation for this visit. The documentation on 04/04/22 for the exam, diagnosis, procedures, and orders are all accurate and complete.    Tyrese Ficek Cranford Mon, MD  [facility] [phone] (phone) [phone] (fax)  [place]

## 2022-04-03 NOTE — Patient Instructions (Signed)
Ask pharmacy about [name] and Jardiance equivalent.

## 2022-04-04 ENCOUNTER — Other Ambulatory Visit: Payer: Self-pay

## 2022-04-04 DIAGNOSIS — R0789 Other chest pain: Secondary | ICD-10-CM

## 2022-04-04 DIAGNOSIS — K30 Functional dyspepsia: Secondary | ICD-10-CM

## 2022-04-04 MED ORDER — QTERN 10-5 MG PO TABS: 1.0000 | ORAL_TABLET | Freq: Every day | ORAL | 3 refills | Status: DC

## 2022-04-04 NOTE — Telephone Encounter (Signed)
Pt must schedule OV first before Rx can be approved as he has not been seen in over 2 years...  Called pt x 4, no answer and getting a busy signal

## 2022-04-04 NOTE — Telephone Encounter (Signed)
I called pt x 4 and wife x 2... I keep getting a busy signal... Pt needs to schedule an OV and I will forward to Dr Allen Norris for refill approval as pt has not been seen in office over 2 years now.Marland KitchenMarland Kitchen

## 2022-04-11 ENCOUNTER — Ambulatory Visit: Payer: Self-pay | Admitting: *Deleted

## 2022-04-11 ENCOUNTER — Other Ambulatory Visit: Payer: Self-pay | Admitting: *Deleted

## 2022-04-11 DIAGNOSIS — E119 Type 2 diabetes mellitus without complications: Secondary | ICD-10-CM

## 2022-04-11 MED ORDER — QTERN 10-5 MG PO TABS: 1.0000 | ORAL_TABLET | Freq: Every day | ORAL | 3 refills | Status: DC

## 2022-04-11 NOTE — Telephone Encounter (Signed)
Summary: Discuss medication   [name] for a diabetic medication but does not know the name of the medication   Please assist patient further      Reason for Disposition  Caller wants to use a complementary or alternative medicine  Answer Assessment - Initial Assessment Questions 1. NAME of MEDICATION: "What medicine are you calling about?"     Farxiga or same 2. QUESTION: "What is your question?" (e.g., double dose of medicine, side effect)     Patient would like to see if PCP can get PA on it 3. PRESCRIBING HCP: "Who prescribed it?" Reason: if prescribed by specialist, call should be referred to that group.     Steven Miranda Patient has decided he would like to see if a PA can be obtained for [name] or equivalent. Please let him know- this would replace the Jardiance he is presently taking  Protocols used: Medication Question Call-A-AH

## 2022-04-11 NOTE — Telephone Encounter (Signed)
Please advise 

## 2022-04-12 ENCOUNTER — Other Ambulatory Visit: Payer: Self-pay | Admitting: *Deleted

## 2022-04-12 NOTE — Progress Notes (Signed)
Error

## 2022-04-14 DIAGNOSIS — I480 Paroxysmal atrial fibrillation: Secondary | ICD-10-CM | POA: Diagnosis not present

## 2022-04-15 ENCOUNTER — Telehealth: Payer: Self-pay | Admitting: Family Medicine

## 2022-04-15 ENCOUNTER — Telehealth: Payer: Self-pay | Admitting: *Deleted

## 2022-04-15 DIAGNOSIS — E119 Type 2 diabetes mellitus without complications: Secondary | ICD-10-CM | POA: Diagnosis not present

## 2022-04-15 LAB — HM DIABETES EYE EXAM

## 2022-04-15 NOTE — Telephone Encounter (Signed)
Please review. Thank you   Medication: empagliflozin (JARDIANCE) 25 MG TABS tablet [[id number]]     The patient was recently prescribed Vergia Alcon which is not covered by their insurance     Preferred Pharmacy (with phone number or street name): [facility], [place] [address] Phone: [phone] Fax: [phone] Hours: Not open 24 hours

## 2022-04-15 NOTE — Telephone Encounter (Signed)
Copied from [place] [phone]. Topic: General - Other >> Apr 15, 2022  1:13 PM Everette C wrote: Reason for CRM: Medication Refill - Medication: empagliflozin (JARDIANCE) 25 MG TABS tablet [[id number]]   Has the patient contacted their pharmacy? Yes.  The patient was recently prescribed Vergia Alcon which is not covered by their insurance  (Agent: If no, request that the patient contact the pharmacy for the refill. If patient does not wish to contact the pharmacy document the reason why and proceed with request.) (Agent: If yes, when and what did the pharmacy advise?)  Preferred Pharmacy (with phone number or street name): [facility], [place] [address] Phone: [phone] Fax: [phone] Hours: Not open 24 hours  Has the patient been seen for an appointment in the last year OR does the patient have an upcoming appointment? Yes.    Agent: Please be advised that RX refills may take up to 3 business days. We ask that you follow-up with your pharmacy.

## 2022-04-16 ENCOUNTER — Other Ambulatory Visit: Payer: Self-pay

## 2022-04-16 MED ORDER — EMPAGLIFLOZIN 25 MG PO TABS: 25.0000 mg | ORAL_TABLET | Freq: Every day | ORAL | 1 refills | Status: DC

## 2022-04-19 MED ORDER — HYOSCYAMINE SULFATE 0.125 MG SL SUBL: 0.1250 mg | SUBLINGUAL_TABLET | Freq: Four times a day (QID) | SUBLINGUAL | 5 refills | Status: DC | PRN

## 2022-04-19 MED ORDER — SUCRALFATE 1 G PO TABS: 1.0000 g | ORAL_TABLET | Freq: Three times a day (TID) | ORAL | 3 refills | Status: DC

## 2022-04-29 DIAGNOSIS — H02401 Unspecified ptosis of right eyelid: Secondary | ICD-10-CM | POA: Diagnosis not present

## 2022-04-29 DIAGNOSIS — E119 Type 2 diabetes mellitus without complications: Secondary | ICD-10-CM | POA: Diagnosis not present

## 2022-04-29 DIAGNOSIS — H40003 Preglaucoma, unspecified, bilateral: Secondary | ICD-10-CM | POA: Diagnosis not present

## 2022-05-01 DIAGNOSIS — I480 Paroxysmal atrial fibrillation: Secondary | ICD-10-CM | POA: Diagnosis not present

## 2022-05-02 DIAGNOSIS — H02401 Unspecified ptosis of right eyelid: Secondary | ICD-10-CM | POA: Diagnosis not present

## 2022-05-09 ENCOUNTER — Ambulatory Visit: Payer: Medicare HMO | Admitting: Cardiology

## 2022-05-09 ENCOUNTER — Encounter: Payer: Self-pay | Admitting: Cardiology

## 2022-05-09 VITALS — BP 134/86 | HR 68 | Ht 74.0 in | Wt 259.0 lb

## 2022-05-09 DIAGNOSIS — I10 Essential (primary) hypertension: Secondary | ICD-10-CM

## 2022-05-09 DIAGNOSIS — E7800 Pure hypercholesterolemia, unspecified: Secondary | ICD-10-CM | POA: Diagnosis not present

## 2022-05-09 DIAGNOSIS — I480 Paroxysmal atrial fibrillation: Secondary | ICD-10-CM | POA: Diagnosis not present

## 2022-05-09 MED ORDER — DILTIAZEM HCL ER COATED BEADS 120 MG PO CP24: 120.0000 mg | ORAL_CAPSULE | Freq: Every day | ORAL | 1 refills | Status: DC

## 2022-05-09 NOTE — Progress Notes (Signed)
Cardiology Office Note:    Date:  05/09/2022   ID:  Steven Miranda, Steven Miranda 03-24-1946, MRN [id number]  PCP:  Jerrol Banana., MD  [facility] Cardiologist:  Kate Sable, MD  [place] Electrophysiologist:  None   Referring MD: Jerrol Banana.,*   Chief Complaint  Patient presents with   6-8 week follow up     Patient is having an eyelid surgery on Sept. 15, 2023. Medications reviewed by the patient verbaly.     History of Present Illness:    Steven Miranda is a 76 y.o. male with a hx of paroxysmal atrial fibrillation, hypertension, hyperlipidemia, diabetes who presents for follow-up.    Being seen for paroxysmal atrial fibrillation, largely asymptomatic.  Last EKG showed sinus rhythm, cardiac monitor and echocardiogram were ordered.  Monitor did not reveal A-fib recurrence.  Originally seen about a year ago with symptoms of palpitations.  Went to the emergency room 2 months ago due to feeling tired.  He had been working in the yard, EKG showed A-fib with controlled ventricular response.  States bruising easily, does not want to start taking anticoagulants unless A-fib becomes more persistent.  Echo 03/29/2022 showed normal systolic function, EF 40%.   Past Medical History:  Diagnosis Date   Actinic keratosis    Arthritis    right thumb   Broken wrist    Diabetes mellitus without complication ([place])    Dysuria    Hyperlipidemia    Hypertension    Wears contact lenses     Past Surgical History:  Procedure Laterality Date   COLONOSCOPY  2005   COLONOSCOPY WITH PROPOFOL N/A 10/16/2016   Procedure: COLONOSCOPY WITH PROPOFOL;  Surgeon: Robert Bellow, MD;  Location: [facility] ENDOSCOPY;  Service: Endoscopy;  Laterality: N/A;   COLONOSCOPY WITH PROPOFOL N/A 12/25/2021   Procedure: COLONOSCOPY WITH PROPOFOL;  Surgeon: Lucilla Lame, MD;  Location: [facility] ENDOSCOPY;  Service: Endoscopy;  Laterality: N/A;   ESOPHAGOGASTRODUODENOSCOPY (EGD) WITH PROPOFOL N/A  12/13/2019   Procedure: ESOPHAGOGASTRODUODENOSCOPY (EGD) WITH BIOPSY;  Surgeon: Lucilla Lame, MD;  Location: [place];  Service: Endoscopy;  Laterality: N/A;  Diabetic - oral meds   PLANTAR'S WART EXCISION     STRABISMUS SURGERY Right     Current Medications: Current Meds  Medication Sig   ACCU-CHEK SMARTVIEW test strip CHECK  FASTING  BLOOD  SUGAR EVERY MORNING AS INSTRUCTED   atorvastatin (LIPITOR) 20 MG tablet Take 1 tablet (20 mg total) by mouth daily.   Blood Glucose Monitoring Suppl (ACCU-CHEK NANO SMARTVIEW) w/Device KIT Use glucometer (Accu-Chek Nano Glucometer) to test fasting glucose daily.   Dapagliflozin-sAXagliptin (QTERN) 10-5 MG TABS Take 1 tablet by mouth daily.   diltiazem (CARDIZEM CD) 120 MG 24 hr capsule Take 1 capsule (120 mg total) by mouth daily.   empagliflozin (JARDIANCE) 25 MG TABS tablet Take 1 tablet (25 mg total) by mouth daily before breakfast.   Ferrous Sulfate (IRON SUPPLEMENT PO) Take 1 tablet by mouth daily.   fluticasone (FLONASE) 50 MCG/ACT nasal spray Place into both nostrils as needed.   glucose blood (TRUE METRIX BLOOD GLUCOSE TEST) test strip Use as instructed   hyoscyamine (LEVSIN SL) 0.125 MG SL tablet Place 1 tablet (0.125 mg total) under the tongue every 6 (six) hours as needed.   Lancets (ONETOUCH ULTRASOFT) lancets Use as instructed once a day for fasting glucose check.   lidocaine (XYLOCAINE) 2 % solution Mix 5 ml with 5 ml of liquid antacid for acute indigetion  pain and swallow. May repeat 2-3 times in a day if needed.   metFORMIN (GLUCOPHAGE) 1000 MG tablet TAKE 1 TABLET TWICE DAILY WITH MEALS   Misc Natural Products (OSTEO BI-FLEX ADV DOUBLE ST) CAPS Take 1 capsule by mouth daily.   pantoprazole (PROTONIX) 40 MG tablet Take 1 tablet (40 mg total) by mouth daily.   pioglitazone (ACTOS) 15 MG tablet Take 1 tablet (15 mg total) by mouth daily.   Probiotic Product (PROBIOTIC FORMULA) CAPS Take by mouth daily.   sucralfate (CARAFATE) 1  g tablet Take 1 tablet (1 g total) by mouth 4 (four) times daily -  with meals and at bedtime.   [DISCONTINUED] amLODipine (NORVASC) 5 MG tablet TAKE 1 TABLET AT BEDTIME     Allergies:   Patient has no known allergies.   Social History   Socioeconomic History   Marital status: Married    Spouse name: Not on file   Number of children: 3   Years of education: Not on file   Highest education level: Bachelor's degree (e.g., BA, AB, BS)  Occupational History   Not on file  Tobacco Use   Smoking status: Former    Types: Cigarettes    Quit date: 1978    Years since quitting: 45.6   Smokeless tobacco: Never   Tobacco comments:    quit in 1978  Vaping Use   Vaping Use: Never used  Substance and Sexual Activity   Alcohol use: Yes    Alcohol/week: 6.0 standard drinks of alcohol    Types: 6 Cans of beer per week    Comment: a couple beers on the weekends   Drug use: No   Sexual activity: Not on file  Other Topics Concern   Not on file  Social History Narrative   Not on file   Social Determinants of Health   Financial Resource Strain: Low Risk  (08/03/2021)   Overall Financial Resource Strain (CARDIA)    Difficulty of Paying Living Expenses: Not hard at all  Food Insecurity: No Food Insecurity (08/03/2021)   Hunger Vital Sign    Worried About Running Out of Food in the Last Year: Never true    Ran Out of Food in the Last Year: Never true  Transportation Needs: No Transportation Needs (08/03/2021)   PRAPARE - Hydrologist (Medical): No    Lack of Transportation (Non-Medical): No  Physical Activity: Sufficiently Active (08/03/2021)   Exercise Vital Sign    Days of Exercise per Week: 4 days    Minutes of Exercise per Session: 60 min  Stress: No Stress Concern Present (08/03/2021)   [place]    Feeling of Stress : Not at all  Social Connections: [place] (08/03/2021)   Social  Connection and Isolation Panel [NHANES]    Frequency of Communication with Friends and Family: More than three times a week    Frequency of Social Gatherings with Friends and Family: More than three times a week    Attends Religious Services: More than 4 times per year    Active Member of [employer] or Organizations: Yes    Attends Archivist Meetings: 1 to 4 times per year    Marital Status: Married     Family History: The patient's family history includes Alcohol abuse in his father; Cancer in his mother; Diabetes in his paternal grandmother; Emphysema in his paternal grandfather; Heart disease in his maternal uncle; Rheumatic fever  in his maternal uncle.  ROS:   Please see the history of present illness.     All other systems reviewed and are negative.  EKGs/Labs/Other Studies Reviewed:    The following studies were reviewed today:   EKG:  EKG is  ordered today.  The ekg ordered today demonstrates normal sinus rhythm, normal ECG  Recent Labs: 03/24/2022: ALT 22; BUN 21; Creatinine, Ser 0.99; Hemoglobin 15.7; Platelets 270; Potassium 4.7; Sodium 140; TSH 4.026  Recent Lipid Panel    Component Value Date/Time   CHOL 157 11/14/2021 1122   TRIG 87 11/14/2021 1122   HDL 67 11/14/2021 1122   CHOLHDL 2.3 11/14/2021 1122   LDLCALC 74 11/14/2021 1122     Risk Assessment/Calculations:      Physical Exam:    VS:  BP 134/86 (BP Location: Left Arm, Patient Position: Sitting, Cuff Size: Normal)   Pulse 68   Ht $R'6\' 2"'rs$  (1.88 m)   Wt 259 lb (117.5 kg)   SpO2 97%   BMI 33.25 kg/m     Wt Readings from Last 3 Encounters:  05/09/22 259 lb (117.5 kg)  04/03/22 256 lb 9.6 oz (116.4 kg)  03/27/22 258 lb 3.2 oz (117.1 kg)     GEN:  Well nourished, well developed in no acute distress HEENT: Normal NECK: No JVD; No carotid bruits CARDIAC: RRR, no murmurs, rubs, gallops RESPIRATORY:  Clear to auscultation without rales, wheezing or rhonchi  ABDOMEN: Soft, non-tender,  non-distended MUSCULOSKELETAL:  No edema; No deformity  SKIN: Warm and dry NEUROLOGIC:  Alert and oriented x 3 PSYCHIATRIC:  Normal affect   ASSESSMENT:    1. PAF (paroxysmal atrial fibrillation) ([place])   2. Primary hypertension   3. Pure hypercholesterolemia    PLAN:    In order of problems listed above:  Paroxysmal atrial fibrillation, CHADS2 Vascor 3.  Currently in sinus rhythm, echo with EF 55%.  Anticoagulation recommended, patient declined.  Start Cardizem 120 mg daily, stop amlodipine. BP controlled, start Cardizem as above, stop Norvasc. Hyperlipidemia, cholesterol controlled continue Lipitor.  Follow-up in 6 months  Medication Adjustments/Labs and Tests Ordered: Current medicines are reviewed at length with the patient today.  Concerns regarding medicines are outlined above.  Orders Placed This Encounter  Procedures   EKG 12-Lead   Meds ordered this encounter  Medications   diltiazem (CARDIZEM CD) 120 MG 24 hr capsule    Sig: Take 1 capsule (120 mg total) by mouth daily.    Dispense:  90 capsule    Refill:  1    Patient Instructions  Medication Instructions:   Your physician has recommended you make the following change in your medication:   STOP Amlodipine   START Diltiazem (long acting) 120 mg daily   *If you need a refill on your cardiac medications before your next appointment, please call your pharmacy*   Lab Work:  None ordered  Testing/Procedures:  None ordered   Follow-Up: At [facility], you and your health needs are our priority.  As part of our continuing mission to provide you with exceptional heart care, we have created designated Provider Care Teams.  These Care Teams include your primary Cardiologist (physician) and Advanced Practice Providers (APPs -  Physician Assistants and Nurse Practitioners) who all work together to provide you with the care you need, when you need it.  We recommend signing up for the patient portal called  "MyChart".  Sign up information is provided on this After Visit Summary.  MyChart is used  to connect with patients for Virtual Visits (Telemedicine).  Patients are able to view lab/test results, encounter notes, upcoming appointments, etc.  Non-urgent messages can be sent to your provider as well.   To learn more about what you can do with MyChart, go to [email or web address].    Your next appointment:   6 month(s)  The format for your next appointment:   In Person  Provider:   You may see Kate Sable, MD or one of the following Advanced Practice Providers on your designated Care Team:   Murray Hodgkins, NP Christell Faith, PA-C Cadence Kathlen Mody, [place]  Important Information About Sugar         Signed, Kate Sable, MD  05/09/2022 11:12 AM    [place]

## 2022-05-09 NOTE — Patient Instructions (Signed)
Medication Instructions:   Your physician has recommended you make the following change in your medication:   STOP Amlodipine   START Diltiazem (long acting) 120 mg daily   *If you need a refill on your cardiac medications before your next appointment, please call your pharmacy*   Lab Work:  None ordered  Testing/Procedures:  None ordered   Follow-Up: At [facility], you and your health needs are our priority.  As part of our continuing mission to provide you with exceptional heart care, we have created designated Provider Care Teams.  These Care Teams include your primary Cardiologist (physician) and Advanced Practice Providers (APPs -  Physician Assistants and Nurse Practitioners) who all work together to provide you with the care you need, when you need it.  We recommend signing up for the patient portal called "MyChart".  Sign up information is provided on this After Visit Summary.  MyChart is used to connect with patients for Virtual Visits (Telemedicine).  Patients are able to view lab/test results, encounter notes, upcoming appointments, etc.  Non-urgent messages can be sent to your provider as well.   To learn more about what you can do with MyChart, go to [email or web address].    Your next appointment:   6 month(s)  The format for your next appointment:   In Person  Provider:   You may see Kate Sable, MD or one of the following Advanced Practice Providers on your designated Care Team:   Murray Hodgkins, NP Christell Faith, PA-C Cadence Kathlen Mody, [place]  Important Information About Sugar

## 2022-05-14 ENCOUNTER — Ambulatory Visit (INDEPENDENT_AMBULATORY_CARE_PROVIDER_SITE_OTHER): Payer: Medicare HMO | Admitting: Family Medicine

## 2022-05-14 ENCOUNTER — Encounter: Payer: Self-pay | Admitting: Family Medicine

## 2022-05-14 VITALS — BP 125/69 | HR 63 | Resp 16 | Wt 256.0 lb

## 2022-05-14 DIAGNOSIS — E7800 Pure hypercholesterolemia, unspecified: Secondary | ICD-10-CM

## 2022-05-14 DIAGNOSIS — I10 Essential (primary) hypertension: Secondary | ICD-10-CM | POA: Diagnosis not present

## 2022-05-14 DIAGNOSIS — E119 Type 2 diabetes mellitus without complications: Secondary | ICD-10-CM

## 2022-05-14 LAB — POCT GLYCOSYLATED HEMOGLOBIN (HGB A1C)
Est. average glucose Bld gHb Est-mCnc: 146
Hemoglobin A1C: 6.7 % — AB (ref 4.0–5.6)

## 2022-05-14 NOTE — Progress Notes (Unsigned)
Established patient visit  I,April Miller,acting as a scribe for Wilhemena Durie, MD.,have documented all relevant documentation on the behalf of Wilhemena Durie, MD,as directed by  Wilhemena Durie, MD while in the presence of Wilhemena Durie, MD.   Patient: Steven Miranda   DOB: [birth year]   76 y.o. Male  MRN: [id number] Visit Date: 05/14/2022  Today's healthcare provider: Wilhemena Durie, MD   Chief Complaint  Patient presents with   Follow-up   Diabetes   Hypertension   Hyperlipidemia   Subjective    HPI  Feels pretty well.  Tolerating Jardiance but is over $500 for prescription for 3 months.  A1c is stable at 6.7 which is the same as last visit He is scheduled for right blepharoplasty  next month  Diabetes Mellitus Type II, follow-up  Lab Results  Component Value Date   HGBA1C 6.7 (A) 05/14/2022   HGBA1C 6.7 (H) 11/14/2021   HGBA1C 6.8 (H) 10/10/2020   Last seen for diabetes 6 months ago.  Management since then includes; Well-controlled at this point.  Home blood sugar records: fasting range: 130-140 Most Recent Eye Exam: 04/15/2022  --------------------------------------------------------------------------------------------------- Hypertension, follow-up  BP Readings from Last 3 Encounters:  05/14/22 125/69  05/09/22 134/86  04/03/22 111/70   Wt Readings from Last 3 Encounters:  05/14/22 256 lb (116.1 kg)  05/09/22 259 lb (117.5 kg)  04/03/22 256 lb 9.6 oz (116.4 kg)     He was last seen for hypertension 1 months ago.  Management since that visit includes; Good control.   Outside blood pressures are not checking.  --------------------------------------------------------------------------------------------------- Lipid/Cholesterol, follow-up  Last Lipid Panel: Lab Results  Component Value Date   CHOL 157 11/14/2021   LDLCALC 74 11/14/2021   HDL 67 11/14/2021   TRIG 87 11/14/2021    He was last seen for this 6 months ago.   Management since that visit includes; On atorvastatin.  Last metabolic panel Lab Results  Component Value Date   GLUCOSE 115 (H) 03/24/2022   NA 140 03/24/2022   K 4.7 03/24/2022   BUN 21 03/24/2022   CREATININE 0.99 03/24/2022   EGFR 74 11/14/2021   GFRNONAA >60 03/24/2022   CALCIUM 9.7 03/24/2022   AST 32 03/24/2022   ALT 22 03/24/2022   The 10-year ASCVD risk score (Arnett DK, et al., 2019) is: 37.5%  ---------------------------------------------------------------------------------------------------   Medications: Outpatient Medications Prior to Visit  Medication Sig   ACCU-CHEK SMARTVIEW test strip CHECK  FASTING  BLOOD  SUGAR EVERY MORNING AS INSTRUCTED   atorvastatin (LIPITOR) 20 MG tablet Take 1 tablet (20 mg total) by mouth daily.   Blood Glucose Monitoring Suppl (ACCU-CHEK NANO SMARTVIEW) w/Device KIT Use glucometer (Accu-Chek Nano Glucometer) to test fasting glucose daily.   Dapagliflozin-sAXagliptin (QTERN) 10-5 MG TABS Take 1 tablet by mouth daily.   diltiazem (CARDIZEM CD) 120 MG 24 hr capsule Take 1 capsule (120 mg total) by mouth daily.   empagliflozin (JARDIANCE) 25 MG TABS tablet Take 1 tablet (25 mg total) by mouth daily before breakfast.   Ferrous Sulfate (IRON SUPPLEMENT PO) Take 1 tablet by mouth daily.   fluticasone (FLONASE) 50 MCG/ACT nasal spray Place into both nostrils as needed.   glucose blood (TRUE METRIX BLOOD GLUCOSE TEST) test strip Use as instructed   hyoscyamine (LEVSIN SL) 0.125 MG SL tablet Place 1 tablet (0.125 mg total) under the tongue every 6 (six) hours as needed.   Lancets (ONETOUCH ULTRASOFT) lancets Use as  instructed once a day for fasting glucose check.   lidocaine (XYLOCAINE) 2 % solution Mix 5 ml with 5 ml of liquid antacid for acute indigetion pain and swallow. May repeat 2-3 times in a day if needed.   metFORMIN (GLUCOPHAGE) 1000 MG tablet TAKE 1 TABLET TWICE DAILY WITH MEALS   Misc Natural Products (OSTEO BI-FLEX ADV DOUBLE ST)  CAPS Take 1 capsule by mouth daily.   pantoprazole (PROTONIX) 40 MG tablet Take 1 tablet (40 mg total) by mouth daily.   pioglitazone (ACTOS) 15 MG tablet Take 1 tablet (15 mg total) by mouth daily.   Probiotic Product (PROBIOTIC FORMULA) CAPS Take by mouth daily.   sucralfate (CARAFATE) 1 g tablet Take 1 tablet (1 g total) by mouth 4 (four) times daily -  with meals and at bedtime.   No facility-administered medications prior to visit.    Review of Systems  Constitutional:  Negative for appetite change, chills and fever.  Respiratory:  Negative for chest tightness, shortness of breath and wheezing.   Cardiovascular:  Negative for chest pain and palpitations.  Gastrointestinal:  Negative for abdominal pain, nausea and vomiting.    Last hemoglobin A1c Lab Results  Component Value Date   HGBA1C 6.7 (A) 05/14/2022       Objective    BP 125/69 (BP Location: Right Arm, Patient Position: Sitting, Cuff Size: Large)   Pulse 63   Resp 16   Wt 256 lb (116.1 kg)   SpO2 96%   BMI 32.87 kg/m  BP Readings from Last 3 Encounters:  05/14/22 125/69  05/09/22 134/86  04/03/22 111/70   Wt Readings from Last 3 Encounters:  05/14/22 256 lb (116.1 kg)  05/09/22 259 lb (117.5 kg)  04/03/22 256 lb 9.6 oz (116.4 kg)      Physical Exam Vitals reviewed.  Constitutional:      General: He is not in acute distress.    Appearance: He is well-developed.  HENT:     Head: Normocephalic and atraumatic.     Right Ear: Hearing normal.     Left Ear: Hearing normal.     Nose: Nose normal.  Eyes:     General: Lids are normal. No scleral icterus.       Right eye: No discharge.        Left eye: No discharge.     Conjunctiva/sclera: Conjunctivae normal.  Cardiovascular:     Rate and Rhythm: Normal rate and regular rhythm.     Heart sounds: Normal heart sounds.  Pulmonary:     Effort: Pulmonary effort is normal. No respiratory distress.  Skin:    General: Skin is warm and dry.     Findings: No  lesion or rash.  Neurological:     General: No focal deficit present.     Mental Status: He is alert and oriented to person, place, and time.  Psychiatric:        Mood and Affect: Mood normal.        Speech: Speech normal.        Behavior: Behavior normal.        Thought Content: Thought content normal.        Judgment: Judgment normal.       Results for orders placed or performed in visit on 05/14/22  POCT glycosylated hemoglobin (Hb A1C)  Result Value Ref Range   Hemoglobin A1C 6.7 (A) 4.0 - 5.6 %   Est. average glucose Bld gHb Est-mCnc 146     Assessment &  Plan     1. Type 2 diabetes mellitus without complication, without long-term current use of insulin ([place]) Tolerating Jardiance.  Jardiance.  Due to cost patient to try taking every other day and see how he does.  Think this is reasonable. - POCT glycosylated hemoglobin (Hb A1C)  2. Essential (primary) hypertension Patient also ready for blepharoplasty next month.  3. Pure hypercholesterolemia ,Tolerating Lipitor   No follow-ups on file.      I, Wilhemena Durie, MD, have reviewed all documentation for this visit. The documentation on 05/16/22 for the exam, diagnosis, procedures, and orders are all accurate and complete.    Verne Lanuza Cranford Mon, MD  [facility] [phone] (phone) [phone] (fax)  [place]

## 2022-05-21 DIAGNOSIS — S81811A Laceration without foreign body, right lower leg, initial encounter: Secondary | ICD-10-CM | POA: Diagnosis not present

## 2022-06-04 DIAGNOSIS — H02401 Unspecified ptosis of right eyelid: Secondary | ICD-10-CM | POA: Diagnosis not present

## 2022-06-06 ENCOUNTER — Encounter: Payer: Self-pay | Admitting: Ophthalmology

## 2022-06-12 NOTE — Discharge Instructions (Signed)

## 2022-06-14 ENCOUNTER — Encounter: Payer: Self-pay | Admitting: Ophthalmology

## 2022-06-14 ENCOUNTER — Other Ambulatory Visit: Payer: Self-pay

## 2022-06-14 ENCOUNTER — Ambulatory Visit
Admission: RE | Admit: 2022-06-14 | Discharge: 2022-06-14 | Disposition: A | Discharge status: Home / Self Care | Payer: Medicare HMO | Attending: Ophthalmology | Admitting: Ophthalmology

## 2022-06-14 ENCOUNTER — Ambulatory Visit: Payer: Medicare HMO | Admitting: Anesthesiology

## 2022-06-14 ENCOUNTER — Encounter
Admission: RE | Disposition: A | Discharge status: Home / Self Care | Payer: Self-pay | Source: Home / Self Care | Attending: Ophthalmology

## 2022-06-14 ENCOUNTER — Ambulatory Visit (AMBULATORY_SURGERY_CENTER): Payer: Medicare HMO | Admitting: Anesthesiology

## 2022-06-14 DIAGNOSIS — I10 Essential (primary) hypertension: Secondary | ICD-10-CM | POA: Insufficient documentation

## 2022-06-14 DIAGNOSIS — K219 Gastro-esophageal reflux disease without esophagitis: Secondary | ICD-10-CM | POA: Insufficient documentation

## 2022-06-14 DIAGNOSIS — Z7984 Long term (current) use of oral hypoglycemic drugs: Secondary | ICD-10-CM | POA: Diagnosis not present

## 2022-06-14 DIAGNOSIS — Z87891 Personal history of nicotine dependence: Secondary | ICD-10-CM | POA: Diagnosis not present

## 2022-06-14 DIAGNOSIS — H02401 Unspecified ptosis of right eyelid: Secondary | ICD-10-CM

## 2022-06-14 DIAGNOSIS — E119 Type 2 diabetes mellitus without complications: Secondary | ICD-10-CM | POA: Diagnosis not present

## 2022-06-14 HISTORY — PX: BROW LIFT: SHX178

## 2022-06-14 HISTORY — DX: Gastro-esophageal reflux disease without esophagitis: K21.9

## 2022-06-14 LAB — GLUCOSE, CAPILLARY
Glucose-Capillary: 129 mg/dL — ABNORMAL HIGH (ref 70–99)
Glucose-Capillary: 143 mg/dL — ABNORMAL HIGH (ref 70–99)

## 2022-06-14 SURGERY — BLEPHAROPLASTY
Anesthesia: General | Site: Eye | Laterality: Right

## 2022-06-14 MED ORDER — LACTATED RINGERS IV SOLN: INTRAVENOUS | Status: DC

## 2022-06-14 MED ORDER — FENTANYL CITRATE (PF) 100 MCG/2ML IJ SOLN
INTRAMUSCULAR | Status: DC | PRN
  Administered 2022-06-14 (×2): 50 ug via INTRAVENOUS

## 2022-06-14 MED ORDER — LIDOCAINE HCL (CARDIAC) PF 100 MG/5ML IV SOSY
PREFILLED_SYRINGE | INTRAVENOUS | Status: DC | PRN
  Administered 2022-06-14: 40 mg via INTRAVENOUS

## 2022-06-14 MED ORDER — LIDOCAINE-EPINEPHRINE 2 %-1:100000 IJ SOLN
INTRAMUSCULAR | Status: DC | PRN
  Administered 2022-06-14: 9.5 mL via OPHTHALMIC

## 2022-06-14 MED ORDER — ERYTHROMYCIN 5 MG/GM OP OINT
TOPICAL_OINTMENT | OPHTHALMIC | Status: DC | PRN
  Administered 2022-06-14: 1 via OPHTHALMIC

## 2022-06-14 MED ORDER — ERYTHROMYCIN 5 MG/GM OP OINT: TOPICAL_OINTMENT | OPHTHALMIC | 2 refills | Status: DC

## 2022-06-14 MED ORDER — TETRACAINE HCL 0.5 % OP SOLN
OPHTHALMIC | Status: DC | PRN
  Administered 2022-06-14 (×2): 1 [drp] via OPHTHALMIC

## 2022-06-14 MED ORDER — ONDANSETRON HCL 4 MG/2ML IJ SOLN: 4.0000 mg | Freq: Once | INTRAMUSCULAR | Status: DC | PRN

## 2022-06-14 MED ORDER — MIDAZOLAM HCL 2 MG/2ML IJ SOLN
INTRAMUSCULAR | Status: DC | PRN
  Administered 2022-06-14: 1 mg via INTRAVENOUS

## 2022-06-14 MED ORDER — BSS IO SOLN
INTRAOCULAR | Status: DC | PRN
  Administered 2022-06-14: 15 mL

## 2022-06-14 MED ORDER — PROPOFOL 500 MG/50ML IV EMUL
INTRAVENOUS | Status: DC | PRN
  Administered 2022-06-14: 30 mg via INTRAVENOUS
  Administered 2022-06-14: 50 ug/kg/min via INTRAVENOUS

## 2022-06-14 MED ORDER — TRAMADOL HCL 50 MG PO TABS: ORAL_TABLET | ORAL | 0 refills | Status: DC

## 2022-06-14 SURGICAL SUPPLY — 20 items
APPLICATOR COTTON TIP WD 3 STR (MISCELLANEOUS) ×1 IMPLANT
CORD BIP STRL DISP 12FT (MISCELLANEOUS) ×1 IMPLANT
GAUZE SPONGE 4X4 12PLY STRL (GAUZE/BANDAGES/DRESSINGS) ×1 IMPLANT
GLOVE SURG UNDER POLY LF SZ7 (GLOVE) ×2 IMPLANT
GOWN STRL REUS W/ TWL LRG LVL3 (GOWN DISPOSABLE) ×1 IMPLANT
GOWN STRL REUS W/TWL LRG LVL3 (GOWN DISPOSABLE) ×1
MARKER SKIN XFINE TIP W/RULER (MISCELLANEOUS) ×1 IMPLANT
NDL FILTER BLUNT 18X1 1/2 (NEEDLE) ×1 IMPLANT
NDL HYPO 30X.5 LL (NEEDLE) ×2 IMPLANT
NEEDLE FILTER BLUNT 18X1 1/2 (NEEDLE) ×1 IMPLANT
NEEDLE HYPO 30X.5 LL (NEEDLE) ×2 IMPLANT
PACK ENT CUSTOM (PACKS) ×1 IMPLANT
SOL PREP PVP 2OZ (MISCELLANEOUS) ×1
SOLUTION PREP PVP 2OZ (MISCELLANEOUS) ×1 IMPLANT
SPONGE GAUZE 2X2 8PLY STRL LF (GAUZE/BANDAGES/DRESSINGS) ×10 IMPLANT
SUT GUT PLAIN 6-0 1X18 ABS (SUTURE) ×1 IMPLANT
SUT PROLENE 6 0 P 1 18 (SUTURE) IMPLANT
SYR 10ML LL (SYRINGE) ×1 IMPLANT
SYR 3ML LL SCALE MARK (SYRINGE) ×1 IMPLANT
WATER STERILE IRR 250ML POUR (IV SOLUTION) ×1 IMPLANT

## 2022-06-14 NOTE — Anesthesia Preprocedure Evaluation (Signed)
Anesthesia Evaluation  Patient identified by MRN, date of birth, ID band Patient awake    Reviewed: Allergy & Precautions, NPO status , Patient's Chart, lab work & pertinent test results  History of Anesthesia Complications Negative for: history of anesthetic complications  Airway Mallampati: III  TM Distance: >3 FB Neck ROM: Full    Dental no notable dental hx. (+) Teeth Intact   Pulmonary neg pulmonary ROS, neg sleep apnea, neg COPD, Patient abstained from smoking.Not current smoker, former smoker,    Pulmonary exam normal breath sounds clear to auscultation       Cardiovascular Exercise Tolerance: Good METShypertension, Pt. on medications (-) CAD and (-) Past MI (-) dysrhythmias  Rhythm:Regular Rate:Normal - Systolic murmurs TTE 6789: Study Result    ECHOCARDIOGRAM REPORT       Patient Name:  Steven Miranda Date of Exam: 03/29/2022  Medical Rec #: [id number]   Height:    74.0 in  Accession #:  [id number]   Weight:    258.2 lb  Date of Birth: Jul 14, 1946   BSA:     2.423 m  Patient Age:  76 years    BP:      128/70 mmHg  Patient Gender: M       HR:      58 bpm.  Exam Location: [place]   Procedure: 2D Echo, 3D Echo, Color Doppler, Cardiac Doppler and Strain  Analysis   Indications:  I48.0 Paroxysmal atrial fibrillation    History:    Patient has no prior history of Echocardiogram  examinations.         Arrythmias:Atrial Fibrillation; Risk Factors:Hypertension,         Dyslipidemia, Diabetes and Former Smoker.    Sonographer:  Pilar Jarvis RDMS, RVT, RDCS  Referring Phys: [id number] [place]    1. Left ventricular ejection fraction, by estimation, is 55%. Left  ventricular ejection fraction by 2D MOD biplane is 50.9 %. The left  ventricle has low normal function. The left ventricle has no regional wall  motion  abnormalities. Left ventricular  diastolic parameters are consistent with Grade I diastolic dysfunction  (impaired relaxation). The average left ventricular global longitudinal  strain is -19.4 %. The global longitudinal strain is normal.  2. Right ventricular systolic function is normal. The right ventricular  size is mildly enlarged.  3. Right atrial size was mildly dilated.  4. The mitral valve is normal in structure. No evidence of mitral valve  regurgitation.  5. The aortic valve is tricuspid. Aortic valve regurgitation is not  visualized.  6. Aortic dilatation noted. There is mild dilatation of the ascending  aorta, measuring 42 mm.      Neuro/Psych  Neuromuscular disease negative neurological ROS  negative psych ROS   GI/Hepatic GERD  Medicated and Controlled,(+)     (-) substance abuse  ,   Endo/Other  diabetes, Oral Hypoglycemic Agents  Renal/GU negative Renal ROS     Musculoskeletal   Abdominal   Peds  Hematology   Anesthesia Other Findings Past Medical History: No date: Actinic keratosis No date: Arthritis     Comment:  right thumb No date: Broken wrist No date: Diabetes mellitus without complication (HCC) No date: Dysuria No date: GERD (gastroesophageal reflux disease) No date: Hyperlipidemia No date: Hypertension No date: Wears contact lenses  Reproductive/Obstetrics  Anesthesia Physical Anesthesia Plan  ASA: 2  Anesthesia Plan: General   Post-op Pain Management: Minimal or no pain anticipated   Induction: Intravenous  PONV Risk Score and Plan: 2 and Propofol infusion, TIVA and Ondansetron  Airway Management Planned: Nasal Cannula  Additional Equipment: None  Intra-op Plan:   Post-operative Plan:   Informed Consent: I have reviewed the patients History and Physical, chart, labs and discussed the procedure including the risks, benefits and alternatives for the proposed anesthesia  with the patient or authorized representative who has indicated his/her understanding and acceptance.     Dental advisory given  Plan Discussed with: CRNA and Surgeon  Anesthesia Plan Comments: (Discussed risks of anesthesia with patient, including possibility of difficulty with spontaneous ventilation under anesthesia necessitating airway intervention, PONV, and rare risks such as cardiac or respiratory or neurological events, and allergic reactions. Discussed the role of CRNA in patient's perioperative care. Patient understands.)        Anesthesia Quick Evaluation

## 2022-06-14 NOTE — Transfer of Care (Signed)
Immediate Anesthesia Transfer of Care Note  Patient: Steven Miranda  Procedure(s) Performed: BLEPHAROPTOSIS REPAIR; RESECT EX RIGHT DIABETIC (Right: Eye)  Patient Location: PACU  Anesthesia Type: General  Level of Consciousness: awake, alert  and patient cooperative  Airway and Oxygen Therapy: Patient Spontanous Breathing and Patient connected to supplemental oxygen  Post-op Assessment: Post-op Vital signs reviewed, Patient's Cardiovascular Status Stable, Respiratory Function Stable, Patent Airway and No signs of Nausea or vomiting  Post-op Vital Signs: Reviewed and stable  Complications: There were no known notable events for this encounter.

## 2022-06-14 NOTE — H&P (Signed)
[place]: [facility]  Primary Care Physician:  Jerrol Banana., MD Ophthalmologist: Dr. Philis Pique. Vickki Muff, M.D.  Pre-Procedure History & Physical: HPI:  Steven Miranda is a 76 y.o. male here for periocular surgery.   Past Medical History:  Diagnosis Date   Actinic keratosis    Arthritis    right thumb   Broken wrist    Diabetes mellitus without complication (HCC)    Dysuria    GERD (gastroesophageal reflux disease)    Hyperlipidemia    Hypertension    Wears contact lenses     Past Surgical History:  Procedure Laterality Date   COLONOSCOPY  2005   COLONOSCOPY WITH PROPOFOL N/A 10/16/2016   Procedure: COLONOSCOPY WITH PROPOFOL;  Surgeon: Robert Bellow, MD;  Location: [facility] ENDOSCOPY;  Service: Endoscopy;  Laterality: N/A;   COLONOSCOPY WITH PROPOFOL N/A 12/25/2021   Procedure: COLONOSCOPY WITH PROPOFOL;  Surgeon: Lucilla Lame, MD;  Location: [facility] ENDOSCOPY;  Service: Endoscopy;  Laterality: N/A;   ESOPHAGOGASTRODUODENOSCOPY (EGD) WITH PROPOFOL N/A 12/13/2019   Procedure: ESOPHAGOGASTRODUODENOSCOPY (EGD) WITH BIOPSY;  Surgeon: Lucilla Lame, MD;  Location: [place];  Service: Endoscopy;  Laterality: N/A;  Diabetic - oral meds   PLANTAR'S WART EXCISION     STRABISMUS SURGERY Right     Prior to Admission medications   Medication Sig Start Date End Date Taking? Authorizing Provider  atorvastatin (LIPITOR) 20 MG tablet Take 1 tablet (20 mg total) by mouth daily. 12/07/21  Yes Jerrol Banana., MD  Dapagliflozin-sAXagliptin (QTERN) 10-5 MG TABS Take 1 tablet by mouth daily. 04/11/22  Yes Jerrol Banana., MD  diltiazem (CARDIZEM CD) 120 MG 24 hr capsule Take 1 capsule (120 mg total) by mouth daily. 05/09/22 11/05/22 Yes Kate Sable, MD  empagliflozin (JARDIANCE) 25 MG TABS tablet Take 1 tablet (25 mg total) by mouth daily before breakfast. 04/16/22  Yes Jerrol Banana., MD  Ferrous Sulfate (IRON SUPPLEMENT PO) Take 1 tablet by mouth  daily.   Yes [provider]  hyoscyamine (LEVSIN SL) 0.125 MG SL tablet Place 1 tablet (0.125 mg total) under the tongue every 6 (six) hours as needed. 04/19/22  Yes Lucilla Lame, MD  metFORMIN (GLUCOPHAGE) 1000 MG tablet TAKE 1 TABLET TWICE DAILY WITH MEALS 02/07/22  Yes Jerrol Banana., MD  Misc Natural Products (OSTEO BI-FLEX ADV DOUBLE ST) CAPS Take 1 capsule by mouth daily.   Yes [provider]  pantoprazole (PROTONIX) 40 MG tablet Take 1 tablet (40 mg total) by mouth daily. 04/19/22  Yes Lucilla Lame, MD  pioglitazone (ACTOS) 15 MG tablet Take 1 tablet (15 mg total) by mouth daily. 12/07/21  Yes Jerrol Banana., MD  Probiotic Product (PROBIOTIC FORMULA) CAPS Take by mouth daily.   Yes [provider]  sucralfate (CARAFATE) 1 g tablet Take 1 tablet (1 g total) by mouth 4 (four) times daily -  with meals and at bedtime. 04/19/22  Yes Lucilla Lame, MD  ACCU-CHEK SMARTVIEW test strip CHECK  FASTING  BLOOD  SUGAR EVERY MORNING AS INSTRUCTED 10/29/16   Chrismon, Vickki Muff, PA-C  Blood Glucose Monitoring Suppl (ACCU-CHEK NANO SMARTVIEW) w/Device KIT Use glucometer (Accu-Chek Nano Glucometer) to test fasting glucose daily. 12/31/16   Chrismon, Vickki Muff, PA-C  fluticasone (FLONASE) 50 MCG/ACT nasal spray Place into both nostrils as needed. Patient not taking: Reported on 06/14/2022    [provider]  glucose blood (TRUE METRIX BLOOD GLUCOSE TEST) test strip Use as  instructed 02/27/22   Jerrol Banana., MD  Lancets [facility] ULTRASOFT) lancets Use as instructed once a day for fasting glucose check. 03/30/15   Chrismon, Vickki Muff, PA-C  lidocaine (XYLOCAINE) 2 % solution Mix 5 ml with 5 ml of liquid antacid for acute indigetion pain and swallow. May repeat 2-3 times in a day if needed. Patient not taking: Reported on 06/06/2022 08/10/19   Chrismon, Vickki Muff, PA-C    Allergies as of 05/07/2022   (No Known Allergies)    Family History  Problem Relation Age  of Onset   Cancer Mother    Alcohol abuse Father    Heart disease Maternal Uncle    Rheumatic fever Maternal Uncle    Diabetes Paternal Grandmother    Emphysema Paternal Grandfather     Social History   Socioeconomic History   Marital status: Married    Spouse name: Not on file   Number of children: 3   Years of education: Not on file   Highest education level: Bachelor's degree (e.g., BA, AB, BS)  Occupational History   Not on file  Tobacco Use   Smoking status: Former    Types: Cigarettes    Quit date: 1978    Years since quitting: 45.7   Smokeless tobacco: Never   Tobacco comments:    quit in 1978  Vaping Use   Vaping Use: Never used  Substance and Sexual Activity   Alcohol use: Yes    Alcohol/week: 6.0 standard drinks of alcohol    Types: 6 Cans of beer per week    Comment: a couple beers on the weekends   Drug use: No   Sexual activity: Not on file  Other Topics Concern   Not on file  Social History Narrative   Not on file   Social Determinants of Health   Financial Resource Strain: Low Risk  (08/03/2021)   Overall Financial Resource Strain (CARDIA)    Difficulty of Paying Living Expenses: Not hard at all  Food Insecurity: No Food Insecurity (08/03/2021)   Hunger Vital Sign    Worried About Running Out of Food in the Last Year: Never true    Ran Out of Food in the Last Year: Never true  Transportation Needs: No Transportation Needs (08/03/2021)   PRAPARE - Hydrologist (Medical): No    Lack of Transportation (Non-Medical): No  Physical Activity: Sufficiently Active (08/03/2021)   Exercise Vital Sign    Days of Exercise per Week: 4 days    Minutes of Exercise per Session: 60 min  Stress: No Stress Concern Present (08/03/2021)   [place]    Feeling of Stress : Not at all  Social Connections: [place] (08/03/2021)   Social Connection and Isolation Panel  [NHANES]    Frequency of Communication with Friends and Family: More than three times a week    Frequency of Social Gatherings with Friends and Family: More than three times a week    Attends Religious Services: More than 4 times per year    Active Member of [employer] or Organizations: Yes    Attends Archivist Meetings: 1 to 4 times per year    Marital Status: Married  Human resources officer Violence: Not At Risk (07/17/2020)   Humiliation, Afraid, Rape, and Kick questionnaire    Fear of Current or Ex-Partner: No    Emotionally Abused: No    Physically Abused: No  Sexually Abused: No    Review of Systems: See HPI, otherwise negative ROS  Physical Exam: BP (!) 173/95   Pulse 66   Temp 98.8 F (37.1 C) (Temporal)   Resp 13   Ht 6' 2"  (1.88 m)   Wt 117.5 kg   SpO2 98%   BMI 33.25 kg/m  General:   Alert and cooperative in NAD Head:  Normocephalic and atraumatic. Respiratory:  Normal work of breathing.  Impression/Plan: Steven Miranda is here for periocular surgery.  Risks, benefits, limitations, and alternatives regarding surgery have been reviewed with the patient.  Questions have been answered.  All parties agreeable.   Karle Starch, MD  06/14/2022, 7:53 AM

## 2022-06-14 NOTE — Op Note (Signed)
Preoperative Diagnosis:  Visually significant blepharoptosis right  Upper Eyelid(s)  Postoperative Diagnosis:  Same.  Procedure(s) Performed:   Blepharoptosis repair with levator aponeurosis advancement right  Upper Eyelid(s)  Teaching Surgeon: Philis Pique. Vickki Muff, M.D.  Assistants: none  Anesthesia: MAC  Specimens: None.  Estimated Blood Loss: Minimal.  Complications: None.  Operative Findings: None Dictated  PROCEDURE:  Allergies were reviewed and the patient has No Known Allergies..   After the risks, benefits, complications and alternatives were discussed with the patient, appropriate informed consent was obtained. While seated in an upright position and looking in primary gaze, the mid pupillary line was marked on the upper eyelid margins bilaterally. The patient was then brought to the operating suite and reclined supine.  Timeout was conducted and the patient was sedated. Local anesthetic consisting of a 50-50 mixture of 2% lidocaine with epinephrine and 0.75% bupivacaine with added Hylenex was injected subcutaneously to the right  upper eyelid(s). After adequate local was instilled, the patient was prepped and draped in the usual sterile fashion for eyelid surgery.   Attention was turned to the upper eyelids. A 76m upper eyelid crease incision line was marked with calipers on right  upper eyelid(s).  Attention was turned to the  right  upper eyelid. A #15 blade was used to open the premarked incision line and hemostasis was obtained with bipolar cautery. Westcott scissors were then used to transect through orbicularis for the length of the incision down to the tarsal plate. Epitarsus was dissected to create a smooth surface to suture to. Dissection was then carried superiorly in the plane between orbicularis and orbital septum. Once the preaponeurotic fat pocket was identified, the orbital septum was opened. This revealed the levator and its aponeurosis.    3 interrupted 6-0  Prolene sutures were then passed partial thickness through the tarsal plates of right  upper eyelid(s). These sutures were placed in line with the mid pupillary, medial limbal, and lateral limbal lines. The sutures were fixed to the levator aponeurosis and adjusted until a nice lid height and contour were achieved. Once nice symmetry was achieved, the skin incisions were closed with a running 6-0 fast absorbing plain suture. The patient tolerated the procedure well. Erythromycin ophthalmic ointment was applied to the incision site(s) followed by ice packs. The patient was taken to the recovery area where he recovered without difficulty.  Post-Op Plan/Instructions:  Mr. FGenterwas instructed to use ice packs frequently for the next 48 hours. His was instructed to use Erythromycin ophthalmic ointment on his incisions 4 times a day for the next 12 to 14 days. He was given a prescription for tramadol (or similar) for pain control should Tylenol not be effective. He was asked to to [facility] [place].  Steven Miranda M. FVickki Muff M.D. Ophthalmology

## 2022-06-14 NOTE — Interval H&P Note (Signed)
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  The various methods of treatment have been discussed with the patient and family. After consideration of risks, benefits and other options for treatment, the patient has consented to  Procedure(s): BLEPHAROPTOSIS REPAIR; RESECT EX RIGHT DIABETIC (Right) as a surgical intervention.  The patient's history has been reviewed, patient examined, no change in status, stable for surgery.  I have reviewed the patient's chart and labs.  Questions were answered to the patient's satisfaction.     Vickki Muff, Manual [place]ar[place] M

## 2022-06-14 NOTE — Anesthesia Postprocedure Evaluation (Signed)
Anesthesia Post Note  Patient: Steven Miranda  Procedure(s) Performed: BLEPHAROPTOSIS REPAIR; RESECT EX RIGHT DIABETIC (Right: Eye)     Patient location during evaluation: PACU Anesthesia Type: General Level of consciousness: awake and alert Pain management: pain level controlled Vital Signs Assessment: post-procedure vital signs reviewed and stable Respiratory status: spontaneous breathing, nonlabored ventilation, respiratory function stable and patient connected to nasal cannula oxygen Cardiovascular status: blood pressure returned to baseline and stable Postop Assessment: no apparent nausea or vomiting Anesthetic complications: no   There were no known notable events for this encounter.  Arita Miss

## 2022-06-17 ENCOUNTER — Encounter: Payer: Self-pay | Admitting: Ophthalmology

## 2022-07-12 ENCOUNTER — Telehealth: Payer: Self-pay | Admitting: Family Medicine

## 2022-07-12 NOTE — Telephone Encounter (Signed)
Pt calls to report that he would have to spend 475 dollars on a month supply of jardiance via his insurance. He is asking if the office has enough samples to last him for a 5 week supply. He says he just needs enough to last until the beginning of next year.   Best contact: [phone]

## 2022-07-15 NOTE — Telephone Encounter (Signed)
No samples of 25 mg currently available.  Have advised patient to call back in about a week and check

## 2022-08-05 ENCOUNTER — Telehealth: Payer: Self-pay | Admitting: Family Medicine

## 2022-08-05 DIAGNOSIS — E782 Mixed hyperlipidemia: Secondary | ICD-10-CM

## 2022-08-05 MED ORDER — ATORVASTATIN CALCIUM 20 MG PO TABS: 20.0000 mg | ORAL_TABLET | Freq: Every day | ORAL | 1 refills | Status: DC

## 2022-08-05 NOTE — Telephone Encounter (Signed)
[facility] faxed refill request for the following medications:   atorvastatin (LIPITOR) 20 MG tablet     Please advise.

## 2022-08-06 DIAGNOSIS — H524 Presbyopia: Secondary | ICD-10-CM | POA: Diagnosis not present

## 2022-08-06 DIAGNOSIS — E113293 Type 2 diabetes mellitus with mild nonproliferative diabetic retinopathy without macular edema, bilateral: Secondary | ICD-10-CM | POA: Diagnosis not present

## 2022-08-20 ENCOUNTER — Ambulatory Visit: Payer: Medicare HMO | Admitting: Gastroenterology

## 2022-08-20 ENCOUNTER — Encounter: Payer: Self-pay | Admitting: Gastroenterology

## 2022-08-20 VITALS — BP 128/78 | HR 69 | Temp 98.2°F | Wt 262.0 lb

## 2022-08-20 DIAGNOSIS — R0789 Other chest pain: Secondary | ICD-10-CM

## 2022-08-20 DIAGNOSIS — Z86010 Personal history of colonic polyps: Secondary | ICD-10-CM

## 2022-08-20 DIAGNOSIS — K2270 Barrett's esophagus without dysplasia: Secondary | ICD-10-CM | POA: Diagnosis not present

## 2022-08-20 DIAGNOSIS — K30 Functional dyspepsia: Secondary | ICD-10-CM

## 2022-08-20 MED ORDER — HYOSCYAMINE SULFATE 0.125 MG SL SUBL: 0.1250 mg | SUBLINGUAL_TABLET | Freq: Four times a day (QID) | SUBLINGUAL | 2 refills | Status: DC | PRN

## 2022-08-20 MED ORDER — CLENPIQ 10-3.5-12 MG-GM -GM/160ML PO SOLN: 1.0000 | Freq: Once | ORAL | 0 refills | Status: DC

## 2022-08-20 MED ORDER — PANTOPRAZOLE SODIUM 40 MG PO TBEC: 40.0000 mg | DELAYED_RELEASE_TABLET | Freq: Every day | ORAL | 11 refills | Status: DC

## 2022-08-20 MED ORDER — SUCRALFATE 1 G PO TABS: 1.0000 g | ORAL_TABLET | Freq: Three times a day (TID) | ORAL | 10 refills | Status: DC

## 2022-08-20 MED ORDER — HYOSCYAMINE SULFATE 0.125 MG SL SUBL: 0.1250 mg | SUBLINGUAL_TABLET | Freq: Four times a day (QID) | SUBLINGUAL | 10 refills | Status: DC | PRN

## 2022-08-20 MED ORDER — SUCRALFATE 1 G PO TABS: 1.0000 g | ORAL_TABLET | Freq: Three times a day (TID) | ORAL | 2 refills | Status: DC

## 2022-08-20 NOTE — Progress Notes (Signed)
Primary Care Physician: Jerrol Banana., MD  Primary Gastroenterologist:  Dr. Lucilla Lame  Chief Complaint  Patient presents with   Follow-up   Gastroesophageal Reflux    Pt reports he had intermittent issues with reflux but believes its related to weight gain... No new concerns    HPI: Steven Miranda is a 76 y.o. male here for refill of his medications.  The patient is in need of Carafate and Protonix.  The patient had a colonoscopy last March with a 20 mm polyp removed that was a tubular adenoma.  The patient was recommended to have a repeat colonoscopy in 1 year.  The patient's last upper endoscopy was in 2021 and he is due for a repeat upper endoscopy for his Barrett's esophagus in 2024.  Past Medical History:  Diagnosis Date   Actinic keratosis    Arthritis    right thumb   Broken wrist    Diabetes mellitus without complication (HCC)    Dysuria    GERD (gastroesophageal reflux disease)    Hyperlipidemia    Hypertension    Wears contact lenses     Current Outpatient Medications  Medication Sig Dispense Refill   ACCU-CHEK SMARTVIEW test strip CHECK  FASTING  BLOOD  SUGAR EVERY MORNING AS INSTRUCTED 100 each 4   atorvastatin (LIPITOR) 20 MG tablet Take 1 tablet (20 mg total) by mouth daily. 90 tablet 1   Blood Glucose Monitoring Suppl (ACCU-CHEK NANO SMARTVIEW) w/Device KIT Use glucometer (Accu-Chek Nano Glucometer) to test fasting glucose daily. 1 kit 0   Dapagliflozin-sAXagliptin (QTERN) 10-5 MG TABS Take 1 tablet by mouth daily. 90 tablet 3   diltiazem (CARDIZEM CD) 120 MG 24 hr capsule Take 1 capsule (120 mg total) by mouth daily. 90 capsule 1   empagliflozin (JARDIANCE) 25 MG TABS tablet Take 1 tablet (25 mg total) by mouth daily before breakfast. 90 tablet 1   erythromycin ophthalmic ointment Apply to sutures 4 times a day for 10-12 days.  Discontinue if allergy develops and call our office 3.5 g 2   Ferrous Sulfate (IRON SUPPLEMENT PO) Take 1 tablet by mouth  daily.     fluticasone (FLONASE) 50 MCG/ACT nasal spray Place into both nostrils as needed.     glucose blood (TRUE METRIX BLOOD GLUCOSE TEST) test strip Use as instructed 100 each 12   hyoscyamine (LEVSIN SL) 0.125 MG SL tablet Place 1 tablet (0.125 mg total) under the tongue every 6 (six) hours as needed. 60 tablet 5   Lancets (ONETOUCH ULTRASOFT) lancets Use as instructed once a day for fasting glucose check. 100 each 3   lidocaine (XYLOCAINE) 2 % solution Mix 5 ml with 5 ml of liquid antacid for acute indigetion pain and swallow. May repeat 2-3 times in a day if needed. 100 mL 0   metFORMIN (GLUCOPHAGE) 1000 MG tablet TAKE 1 TABLET TWICE DAILY WITH MEALS 180 tablet 1   Misc Natural Products (OSTEO BI-FLEX ADV DOUBLE ST) CAPS Take 1 capsule by mouth daily.     pantoprazole (PROTONIX) 40 MG tablet Take 1 tablet (40 mg total) by mouth daily. 90 tablet 0   pioglitazone (ACTOS) 15 MG tablet Take 1 tablet (15 mg total) by mouth daily. 90 tablet 1   Probiotic Product (PROBIOTIC FORMULA) CAPS Take by mouth daily.     sucralfate (CARAFATE) 1 g tablet Take 1 tablet (1 g total) by mouth 4 (four) times daily -  with meals and at bedtime. 120 tablet 3  traMADol (ULTRAM) 50 MG tablet Take 1 every 4-6 hours as needed for pain not controlled by Tylenol 6 tablet 0   No current facility-administered medications for this visit.    Allergies as of 08/20/2022   (No Known Allergies)    ROS:  General: Negative for anorexia, weight loss, fever, chills, fatigue, weakness. ENT: Negative for hoarseness, difficulty swallowing , nasal congestion. CV: Negative for chest pain, angina, palpitations, dyspnea on exertion, peripheral edema.  Respiratory: Negative for dyspnea at rest, dyspnea on exertion, cough, sputum, wheezing.  GI: See history of present illness. GU:  Negative for dysuria, hematuria, urinary incontinence, urinary frequency, nocturnal urination.  Endo: Negative for unusual weight change.     Physical Examination:   BP 128/78 (BP Location: Left Arm, Patient Position: Sitting, Cuff Size: Large)   Pulse 69   Temp 98.2 F (36.8 C) (Oral)   Wt 262 lb (118.8 kg)   BMI 33.64 kg/m   General: Well-nourished, well-developed in no acute distress.  Eyes: No icterus. Conjunctivae pink. Lungs: Clear to auscultation bilaterally. Non-labored. Heart: Regular rate and rhythm, no murmurs rubs or gallops.  Abdomen: Bowel sounds are normal, nontender, nondistended, no hepatosplenomegaly or masses, no abdominal bruits or hernia , no rebound or guarding.   Extremities: No lower extremity edema. No clubbing or deformities. Neuro: Alert and oriented x 3.  Grossly intact. Skin: Warm and dry, no jaundice.   Psych: Alert and cooperative, normal mood and affect.  Labs:    Imaging Studies: No results found.  Assessment and Plan:   Steven Miranda is a 76 y.o. y/o male who comes in today with a history of Barrett's esophagus and in need of a repeat EGD and colonoscopy in March for Barrett's esophagus and a large colon polyp.  The patient also needs a refill of his Carafate and his Protonix.  The patient will have these refilled and we'll follow-up at the time of the EGD and colonoscopy.  The patient has been explained the plan and agrees with it.     Lucilla Lame, MD. Marval Regal    Note: This dictation was prepared with Dragon dictation along with smaller phrase technology. Any transcriptional errors that result from this process are unintentional.

## 2022-08-27 MED ORDER — CLENPIQ 10-3.5-12 MG-GM -GM/160ML PO SOLN: 1.0000 | Freq: Once | ORAL | 0 refills | Status: AC

## 2022-08-27 NOTE — Addendum Note (Signed)
Addended by: Vanetta Mulders on: 08/27/2022 09:52 AM   Modules accepted: Orders

## 2022-09-09 DIAGNOSIS — H40013 Open angle with borderline findings, low risk, bilateral: Secondary | ICD-10-CM | POA: Diagnosis not present

## 2022-09-16 ENCOUNTER — Telehealth: Payer: Self-pay | Admitting: Family Medicine

## 2022-09-16 DIAGNOSIS — E0800 Diabetes mellitus due to underlying condition with hyperosmolarity without nonketotic hyperglycemic-hyperosmolar coma (NKHHC): Secondary | ICD-10-CM

## 2022-09-16 DIAGNOSIS — E119 Type 2 diabetes mellitus without complications: Secondary | ICD-10-CM

## 2022-09-16 NOTE — Telephone Encounter (Signed)
[facility] faxed refill request for the following medications:   metFORMIN (GLUCOPHAGE) 1000 MG tablet    Please advise

## 2022-09-16 NOTE — Telephone Encounter (Signed)
[facility] faxed refill request for the following medications:   pioglitazone (ACTOS) 15 MG tablet   Please advise

## 2022-09-17 MED ORDER — METFORMIN HCL 1000 MG PO TABS: 1000.0000 mg | ORAL_TABLET | Freq: Two times a day (BID) | ORAL | 0 refills | Status: AC

## 2022-09-17 MED ORDER — PIOGLITAZONE HCL 15 MG PO TABS: 15.0000 mg | ORAL_TABLET | Freq: Every day | ORAL | 0 refills | Status: AC

## 2022-10-07 ENCOUNTER — Encounter: Payer: Self-pay | Admitting: Gastroenterology

## 2022-10-07 MED ORDER — PANTOPRAZOLE SODIUM 40 MG PO TBEC: 40.0000 mg | DELAYED_RELEASE_TABLET | Freq: Every day | ORAL | 2 refills | Status: DC

## 2022-10-07 NOTE — Addendum Note (Signed)
Addended by: Lurlean Nanny on: 10/07/2022 12:39 PM   Modules accepted: Orders

## 2022-10-30 DIAGNOSIS — I10 Essential (primary) hypertension: Secondary | ICD-10-CM | POA: Diagnosis not present

## 2022-10-30 DIAGNOSIS — E7800 Pure hypercholesterolemia, unspecified: Secondary | ICD-10-CM | POA: Diagnosis not present

## 2022-10-30 DIAGNOSIS — E119 Type 2 diabetes mellitus without complications: Secondary | ICD-10-CM | POA: Diagnosis not present

## 2022-10-30 DIAGNOSIS — E7801 Familial hypercholesterolemia: Secondary | ICD-10-CM | POA: Diagnosis not present

## 2022-11-12 ENCOUNTER — Ambulatory Visit: Payer: Medicare HMO | Attending: Cardiology | Admitting: Cardiology

## 2022-11-12 ENCOUNTER — Encounter: Payer: Self-pay | Admitting: Cardiology

## 2022-11-12 VITALS — BP 136/90 | HR 60 | Ht 74.0 in | Wt 262.0 lb

## 2022-11-12 DIAGNOSIS — E782 Mixed hyperlipidemia: Secondary | ICD-10-CM

## 2022-11-12 DIAGNOSIS — I10 Essential (primary) hypertension: Secondary | ICD-10-CM | POA: Diagnosis not present

## 2022-11-12 DIAGNOSIS — I480 Paroxysmal atrial fibrillation: Secondary | ICD-10-CM

## 2022-11-12 MED ORDER — DILTIAZEM HCL ER COATED BEADS 120 MG PO CP24: 120.0000 mg | ORAL_CAPSULE | Freq: Every day | ORAL | 3 refills | Status: AC

## 2022-11-12 MED ORDER — ATORVASTATIN CALCIUM 20 MG PO TABS: 20.0000 mg | ORAL_TABLET | Freq: Every day | ORAL | 3 refills | Status: AC

## 2022-11-12 NOTE — Progress Notes (Signed)
Cardiology Office Note:    Date:  11/12/2022   ID:  Steven Miranda [birth year], MRN [id number]  PCP:  Steven Post, MD  [facility] Cardiologist:  Steven Sable, MD  [facility] Electrophysiologist:  None   Referring MD: Steven Post, MD   Chief Complaint  Patient presents with   Follow-up    6 month f/u, no new cardiac concerns      History of Present Illness:    Steven Miranda is a 77 y.o. male with a hx of paroxysmal atrial fibrillation, hypertension, hyperlipidemia, diabetes who presents for follow-up.    Norvasc previously switched to Cardizem.  Tolerating medication with no adverse effects.  Denies palpitations.  Does not check blood pressure frequently at home, but states being normal whenever he visits PCP.  States drinking coffee this a.m. which might have elevated BP.  Overall he feels well, no concerns at this time.  Prior notes States bruising easily, does not want to start taking anticoagulants unless A-fib becomes more persistent. Echo 03/29/2022 showed normal systolic function, EF [id number].   Past Medical History:  Diagnosis Date   Actinic keratosis    Arthritis    right thumb   Broken wrist    Diabetes mellitus without complication (HCC)    Dysuria    GERD (gastroesophageal reflux disease)    Hyperlipidemia    Hypertension    Wears contact lenses     Past Surgical History:  Procedure Laterality Date   BROW LIFT Right 06/14/2022   Procedure: BLEPHAROPTOSIS REPAIR; RESECT EX RIGHT DIABETIC;  Surgeon: Steven Starch, MD;  Location: [place];  Service: Ophthalmology;  Laterality: Right;   COLONOSCOPY  2005   COLONOSCOPY WITH PROPOFOL N/A 10/16/2016   Procedure: COLONOSCOPY WITH PROPOFOL;  Surgeon: Steven Bellow, MD;  Location: [facility] ENDOSCOPY;  Service: Endoscopy;  Laterality: N/A;   COLONOSCOPY WITH PROPOFOL N/A 12/25/2021   Procedure: COLONOSCOPY WITH PROPOFOL;  Surgeon: Steven Lame, MD;  Location: [facility] ENDOSCOPY;   Service: Endoscopy;  Laterality: N/A;   ESOPHAGOGASTRODUODENOSCOPY (EGD) WITH PROPOFOL N/A 12/13/2019   Procedure: ESOPHAGOGASTRODUODENOSCOPY (EGD) WITH BIOPSY;  Surgeon: Steven Lame, MD;  Location: [place];  Service: Endoscopy;  Laterality: N/A;  Diabetic - oral meds   PLANTAR'S WART EXCISION     STRABISMUS SURGERY Right     Current Medications: Current Meds  Medication Sig   ACCU-CHEK SMARTVIEW test strip CHECK  FASTING  BLOOD  SUGAR EVERY MORNING AS INSTRUCTED   Blood Glucose Monitoring Suppl (ACCU-CHEK NANO SMARTVIEW) w/Device KIT Use glucometer (Accu-Chek Nano Glucometer) to test fasting glucose daily.   Dapagliflozin-sAXagliptin (QTERN) 10-5 MG TABS Take 1 tablet by mouth daily.   empagliflozin (JARDIANCE) 25 MG TABS tablet Take 1 tablet (25 mg total) by mouth daily before breakfast.   erythromycin ophthalmic ointment Apply to sutures 4 times a day for 10-12 days.  Discontinue if allergy develops and call our office   Ferrous Sulfate (IRON SUPPLEMENT PO) Take 1 tablet by mouth daily.   fluticasone (FLONASE) 50 MCG/ACT nasal spray Place into both nostrils as needed.   glucose blood (TRUE METRIX BLOOD GLUCOSE TEST) test strip Use as instructed   Lancets (ONETOUCH ULTRASOFT) lancets Use as instructed once a day for fasting glucose check.   lidocaine (XYLOCAINE) 2 % solution Mix 5 ml with 5 ml of liquid antacid for acute indigetion pain and swallow. May repeat 2-3 times in a day if needed.   metFORMIN (GLUCOPHAGE) 1000 MG tablet Take 1  tablet (1,000 mg total) by mouth 2 (two) times daily with a meal.   Misc Natural Products (OSTEO BI-FLEX ADV DOUBLE ST) CAPS Take 1 capsule by mouth daily.   pantoprazole (PROTONIX) 40 MG tablet Take 1 tablet (40 mg total) by mouth daily.   pioglitazone (ACTOS) 15 MG tablet Take 1 tablet (15 mg total) by mouth daily.   Probiotic Product (PROBIOTIC FORMULA) CAPS Take by mouth daily.   sucralfate (CARAFATE) 1 g tablet Take 1 tablet (1 g total) by  mouth 4 (four) times daily -  with meals and at bedtime.   traMADol (ULTRAM) 50 MG tablet Take 1 every 4-6 hours as needed for pain not controlled by Tylenol   [DISCONTINUED] atorvastatin (LIPITOR) 20 MG tablet Take 1 tablet (20 mg total) by mouth daily.   [DISCONTINUED] diltiazem (CARDIZEM CD) 120 MG 24 hr capsule Take 1 capsule (120 mg total) by mouth daily.     Allergies:   Patient has no known allergies.   Social History   Socioeconomic History   Marital status: Married    Spouse name: Not on file   Number of children: 3   Years of education: Not on file   Highest education level: Bachelor's degree (e.g., BA, AB, BS)  Occupational History   Not on file  Tobacco Use   Smoking status: Former    Types: Cigarettes    Quit date: 1978    Years since quitting: 46.1   Smokeless tobacco: Never   Tobacco comments:    quit in 1978  Vaping Use   Vaping Use: Never used  Substance and Sexual Activity   Alcohol use: Yes    Alcohol/week: 6.0 standard drinks of alcohol    Types: 6 Cans of beer per week    Comment: a couple beers on the weekends   Drug use: No   Sexual activity: Not on file  Other Topics Concern   Not on file  Social History Narrative   Not on file   Social Determinants of Health   Financial Resource Strain: Low Risk  (08/03/2021)   Overall Financial Resource Strain (CARDIA)    Difficulty of Paying Living Expenses: Not hard at all  Food Insecurity: No Food Insecurity (08/03/2021)   Hunger Vital Sign    Worried About Running Out of Food in the Last Year: Never true    Ran Out of Food in the Last Year: Never true  Transportation Needs: No Transportation Needs (08/03/2021)   PRAPARE - Hydrologist (Medical): No    Lack of Transportation (Non-Medical): No  Physical Activity: Sufficiently Active (08/03/2021)   Exercise Vital Sign    Days of Exercise per Week: 4 days    Minutes of Exercise per Session: 60 min  Stress: No Stress Concern  Present (08/03/2021)   [place]    Feeling of Stress : Not at all  Social Connections: [place] (08/03/2021)   Social Connection and Isolation Panel [NHANES]    Frequency of Communication with Friends and Family: More than three times a week    Frequency of Social Gatherings with Friends and Family: More than three times a week    Attends Religious Services: More than 4 times per year    Active Member of [employer] or Organizations: Yes    Attends Archivist Meetings: 1 to 4 times per year    Marital Status: Married     Family History: The  patient's family history includes Alcohol abuse in his father; Cancer in his mother; Diabetes in his paternal grandmother; Emphysema in his paternal grandfather; Heart disease in his maternal uncle; Rheumatic fever in his maternal uncle.  ROS:   Please see the history of present illness.     All other systems reviewed and are negative.  EKGs/Labs/Other Studies Reviewed:    The following studies were reviewed today:   EKG:  EKG not ordered today.  Recent Labs: 03/24/2022: ALT 22; BUN 21; Creatinine, Ser 0.99; Hemoglobin 15.7; Platelets 270; Potassium 4.7; Sodium 140; TSH 4.026  Recent Lipid Panel    Component Value Date/Time   CHOL 157 11/14/2021 1122   TRIG 87 11/14/2021 1122   HDL 67 11/14/2021 1122   CHOLHDL 2.3 11/14/2021 1122   LDLCALC 74 11/14/2021 1122     Risk Assessment/Calculations:      Physical Exam:    VS:  BP (!) 136/90 (BP Location: Left Arm, Patient Position: Sitting, Cuff Size: Normal)   Pulse 60   Ht 6' 2"$  (1.88 m)   Wt 262 lb (118.8 kg)   SpO2 96%   BMI 33.64 kg/m     Wt Readings from Last 3 Encounters:  11/12/22 262 lb (118.8 kg)  08/20/22 262 lb (118.8 kg)  06/14/22 259 lb (117.5 kg)     GEN:  Well nourished, well developed in no acute distress HEENT: Normal NECK: No JVD; No carotid bruits CARDIAC: RRR, no murmurs,  rubs, gallops RESPIRATORY:  Clear to auscultation without rales, wheezing or rhonchi  ABDOMEN: Soft, non-tender, non-distended MUSCULOSKELETAL:  No edema; No deformity  SKIN: Warm and dry NEUROLOGIC:  Alert and oriented x 3 PSYCHIATRIC:  Normal affect   ASSESSMENT:    1. PAF (paroxysmal atrial fibrillation) ([place])   2. Primary hypertension   3. Mixed hyperlipidemia    PLAN:    In order of problems listed above:  Paroxysmal atrial fibrillation, CHA2DS2-VASc score 3.  Regular rhythm on exam., echo with EF 55%.  Previously declined anticoagulation. Continue Cardizem 120 mg daily. Hypertension, BP elevated, usually controlled, continue Cardizem 120 mg daily.  If BP stays elevated at follow-up visit, add losartan.  Patient is a diabetic. Hyperlipidemia, cholesterol controlled continue Lipitor.  Follow-up in 6 months  Medication Adjustments/Labs and Tests Ordered: Current medicines are reviewed at length with the patient today.  Concerns regarding medicines are outlined above.  No orders of the defined types were placed in this encounter.  Meds ordered this encounter  Medications   diltiazem (CARDIZEM CD) 120 MG 24 hr capsule    Sig: Take 1 capsule (120 mg total) by mouth daily.    Dispense:  90 capsule    Refill:  3   atorvastatin (LIPITOR) 20 MG tablet    Sig: Take 1 tablet (20 mg total) by mouth daily.    Dispense:  90 tablet    Refill:  3    Patient Instructions  Medication Instructions:   Your physician recommends that you continue on your current medications as directed. Please refer to the Current Medication list given to you today.  *If you need a refill on your cardiac medications before your next appointment, please call your pharmacy*   Lab Work:  None Ordered  If you have labs (blood work) drawn today and your tests are completely normal, you will receive your results only by: [place] (if you have MyChart) OR A paper copy in the mail If you have any  lab test that is abnormal  or we need to change your treatment, we will call you to review the results.   Testing/Procedures:  None Ordered   Follow-Up: At [facility], you and your health needs are our priority.  As part of our continuing mission to provide you with exceptional heart care, we have created designated Provider Care Teams.  These Care Teams include your primary Cardiologist (physician) and Advanced Practice Providers (APPs -  Physician Assistants and Nurse Practitioners) who all work together to provide you with the care you need, when you need it.  We recommend signing up for the patient portal called "MyChart".  Sign up information is provided on this After Visit Summary.  MyChart is used to connect with patients for Virtual Visits (Telemedicine).  Patients are able to view lab/test results, encounter notes, upcoming appointments, etc.  Non-urgent messages can be sent to your provider as well.   To learn more about what you can do with MyChart, go to [email or web address].    Your next appointment:   6 month(s)  Provider:   You may see Steven Sable, MD or one of the following Advanced Practice Providers on your designated Care Team:   Murray Hodgkins, NP Christell Faith, PA-C Cadence Kathlen Mody, PA-C Gerrie Nordmann, NP   Signed, Steven Sable, MD  11/12/2022 10:11 AM    [place]

## 2022-11-12 NOTE — Patient Instructions (Signed)

## 2022-11-15 IMAGING — CT CT HEAD W/O CM
3 series · 15 of 47 positions shown, 18 images · non-contrast
Comparison: CT brain 02/07/2019

CLINICAL DATA: Head trauma fall

EXAM:
CT HEAD WITHOUT CONTRAST
CT CERVICAL SPINE WITHOUT CONTRAST
TECHNIQUE: Multidetector CT imaging of the head and cervical spine was
performed following the standard protocol without intravenous
contrast. Multiplanar CT image reconstructions of the cervical spine
were also generated.

[Series 3: head wo · axial · 0.44mm/px · z∈[-131,-6]mm · 9 of 31 slices shown, 12 images]
[im 3/31  brain]
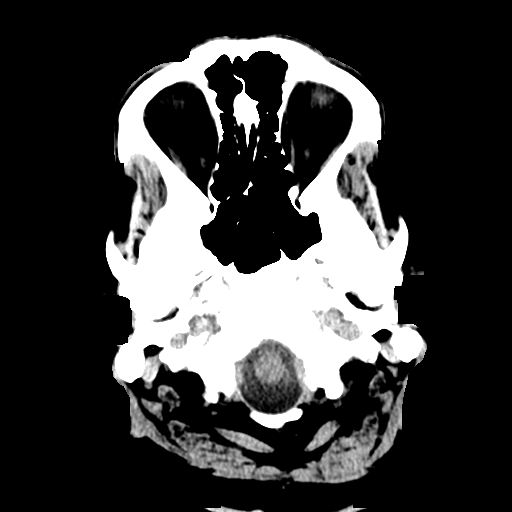
[im 3/31  bone]
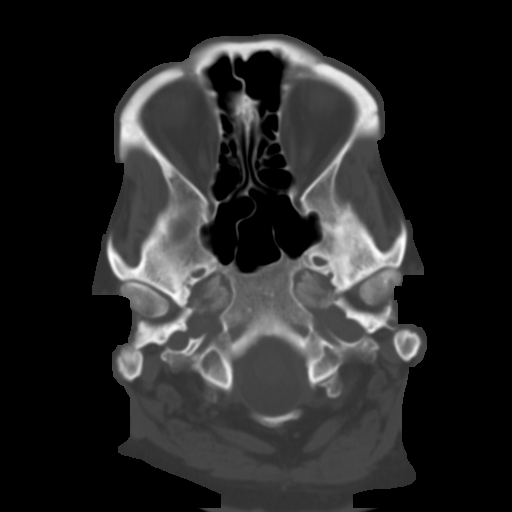
[im 6/31  brain]
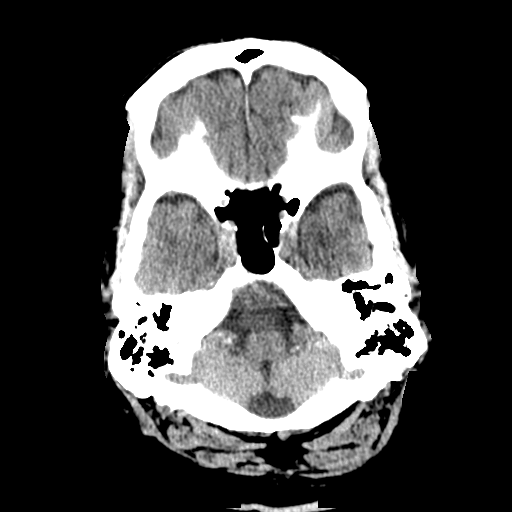
[im 9/31  brain]
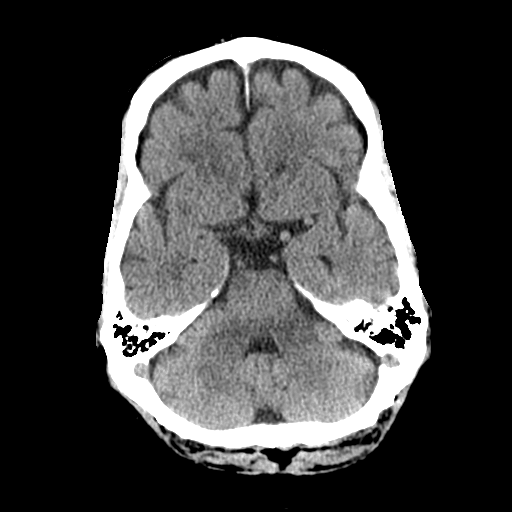
[im 12/31  brain]
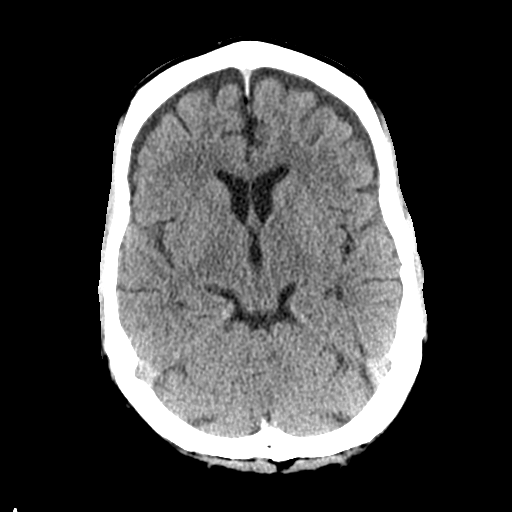
[im 16/31  brain]
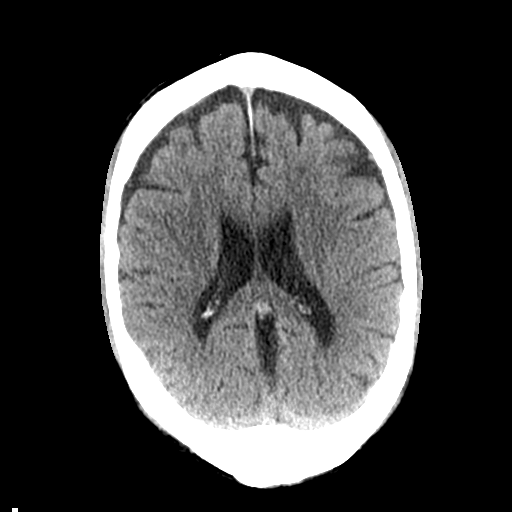
[im 16/31  bone]
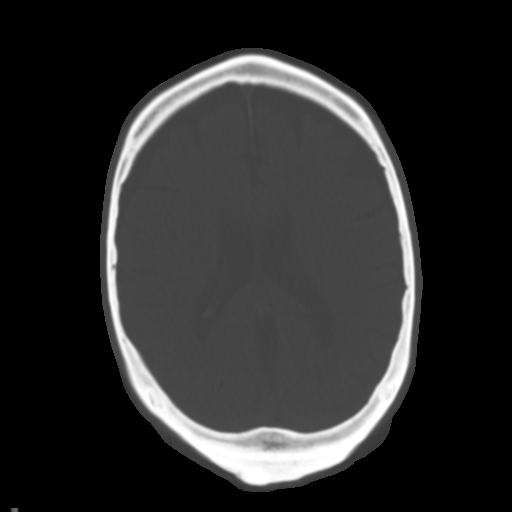
[im 19/31  brain]
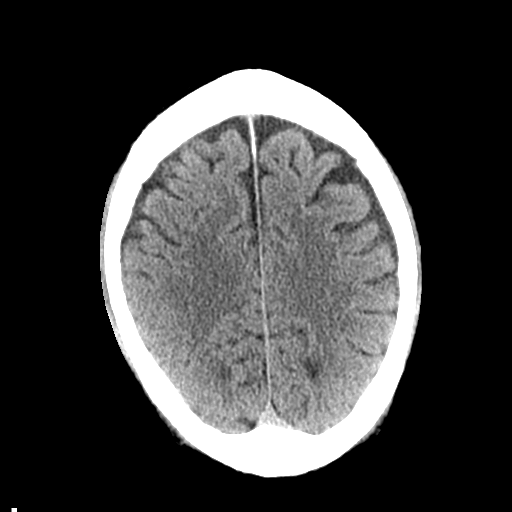
[im 22/31  brain]
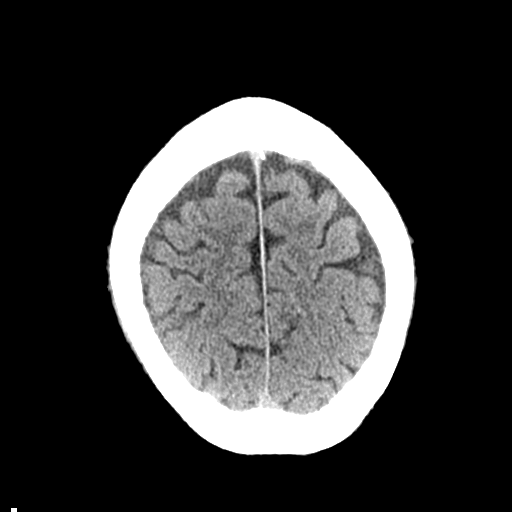
[im 25/31  brain]
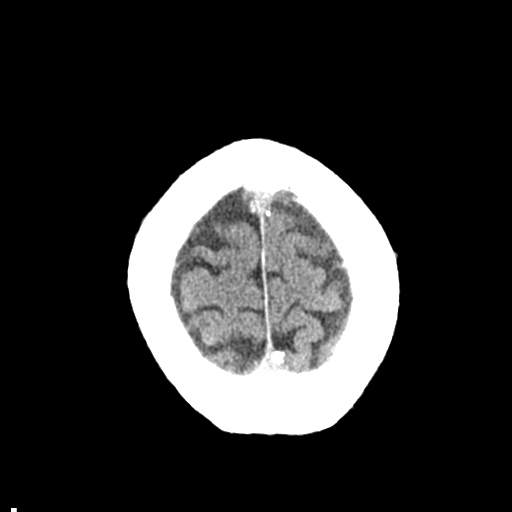
[im 28/31  brain]
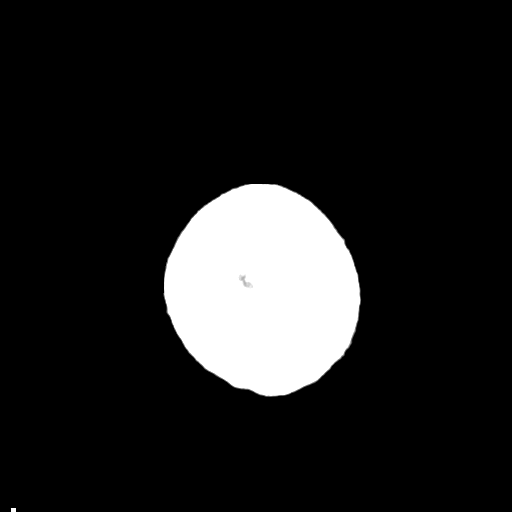
[im 28/31  bone]
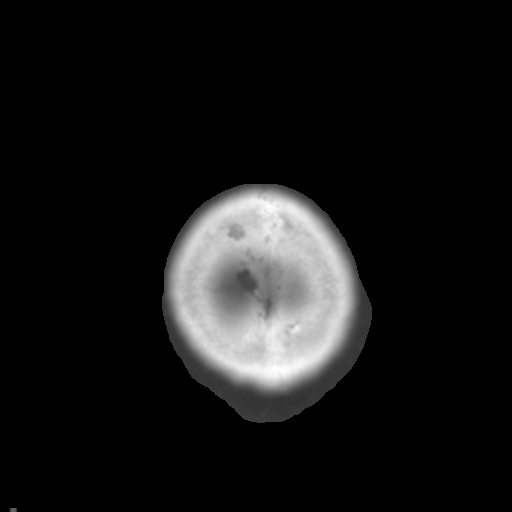

[Series 4: coronal soft tissue · coronal · 0.37mm/px · 3 of 71 slices shown]
[im 24/71  brain]
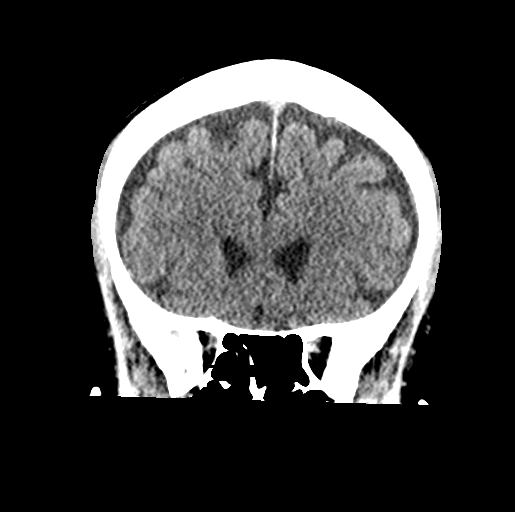
[im 32/71  brain]
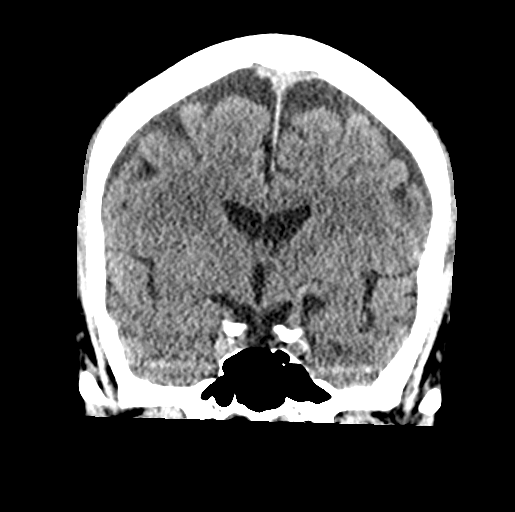
[im 39/71  brain]
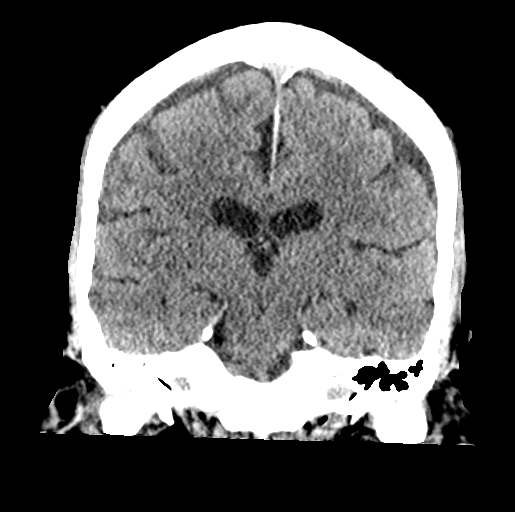

[Series 5: sagittal soft tissue · sagittal · 0.37mm/px · 3 of 55 slices shown]
[im 19/55  brain]
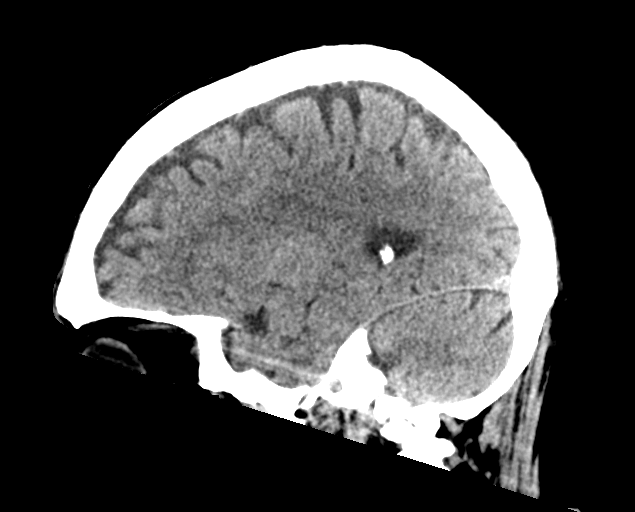
[im 28/55  brain]
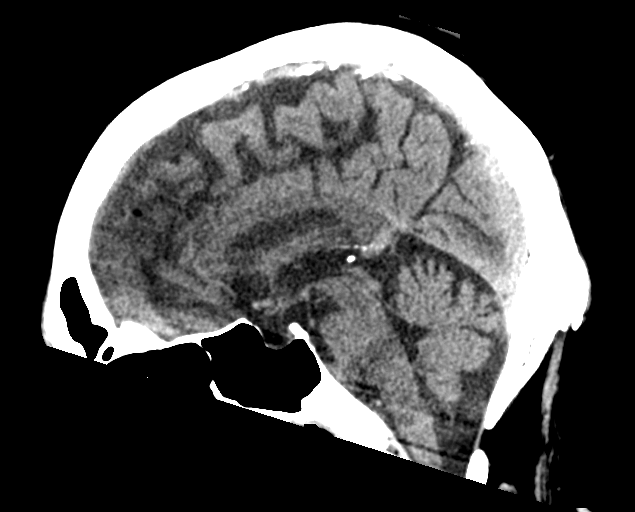
[im 37/55  brain]
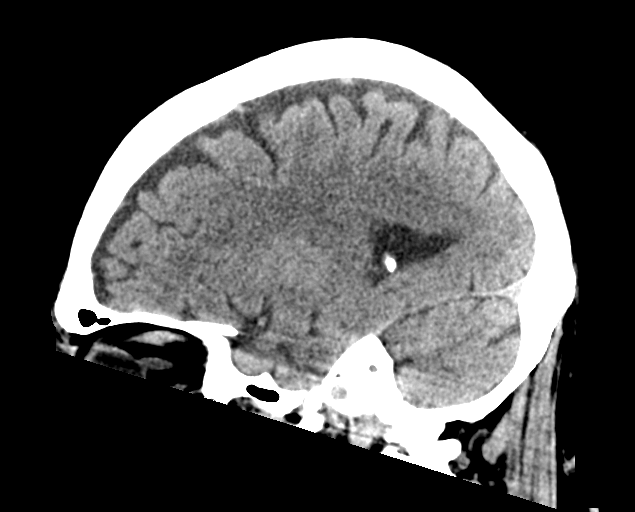

[15 of 47 positions shown; findings below may reference images not displayed]

FINDINGS: CT HEAD FINDINGS

Brain: No acute territorial infarction, hemorrhage or intracranial
mass is visualized. Prominent anterior extra-axial CSF densities
without change, either due to atrophy or small chronic subdural
fluid collections. Stable ventricle size.

Vascular: No hyperdense vessels. Scattered carotid vascular
calcification

Skull: Normal. Negative for fracture or focal lesion.

Sinuses/Orbits: No acute finding.

Other: None

CT CERVICAL SPINE FINDINGS

Alignment: Reversal of cervical lordosis. Facet alignment is
maintained

Skull base and vertebrae: No acute fracture. No primary bone lesion
or focal pathologic process.

Soft tissues and spinal canal: No prevertebral fluid or swelling. No
visible canal hematoma.

Disc levels: Mild disc space narrowing and degenerative change
C6-C7. degenerative changes at the C1-C2 articulation

Upper chest: Negative.

Other: None
IMPRESSION: 1. No CT evidence for acute intracranial abnormality.
2. Reversal of cervical lordosis.  No acute osseous abnormality

## 2022-11-22 DIAGNOSIS — Z0100 Encounter for examination of eyes and vision without abnormal findings: Secondary | ICD-10-CM | POA: Diagnosis not present

## 2022-11-25 ENCOUNTER — Other Ambulatory Visit: Payer: Self-pay | Admitting: Family Medicine

## 2022-11-25 DIAGNOSIS — E0800 Diabetes mellitus due to underlying condition with hyperosmolarity without nonketotic hyperglycemic-hyperosmolar coma (NKHHC): Secondary | ICD-10-CM

## 2022-12-30 ENCOUNTER — Encounter: Payer: Self-pay | Admitting: Gastroenterology

## 2022-12-31 ENCOUNTER — Encounter: Payer: Self-pay | Admitting: Gastroenterology

## 2022-12-31 ENCOUNTER — Ambulatory Visit: Payer: Medicare HMO | Admitting: Anesthesiology

## 2022-12-31 ENCOUNTER — Ambulatory Visit
Admission: RE | Admit: 2022-12-31 | Discharge: 2022-12-31 | Disposition: A | Discharge status: Home / Self Care | Payer: Medicare HMO | Attending: Gastroenterology | Admitting: Gastroenterology

## 2022-12-31 ENCOUNTER — Encounter
Admission: RE | Disposition: A | Discharge status: Home / Self Care | Payer: Self-pay | Source: Home / Self Care | Attending: Gastroenterology

## 2022-12-31 DIAGNOSIS — Z86010 Personal history of colonic polyps: Secondary | ICD-10-CM | POA: Insufficient documentation

## 2022-12-31 DIAGNOSIS — Z87891 Personal history of nicotine dependence: Secondary | ICD-10-CM | POA: Insufficient documentation

## 2022-12-31 DIAGNOSIS — K640 First degree hemorrhoids: Secondary | ICD-10-CM | POA: Diagnosis not present

## 2022-12-31 DIAGNOSIS — I10 Essential (primary) hypertension: Secondary | ICD-10-CM | POA: Diagnosis not present

## 2022-12-31 DIAGNOSIS — D125 Benign neoplasm of sigmoid colon: Secondary | ICD-10-CM | POA: Diagnosis not present

## 2022-12-31 DIAGNOSIS — E119 Type 2 diabetes mellitus without complications: Secondary | ICD-10-CM | POA: Diagnosis not present

## 2022-12-31 DIAGNOSIS — Z1211 Encounter for screening for malignant neoplasm of colon: Secondary | ICD-10-CM | POA: Diagnosis not present

## 2022-12-31 DIAGNOSIS — E785 Hyperlipidemia, unspecified: Secondary | ICD-10-CM | POA: Diagnosis not present

## 2022-12-31 DIAGNOSIS — K219 Gastro-esophageal reflux disease without esophagitis: Secondary | ICD-10-CM | POA: Diagnosis not present

## 2022-12-31 DIAGNOSIS — K635 Polyp of colon: Secondary | ICD-10-CM | POA: Diagnosis not present

## 2022-12-31 DIAGNOSIS — K2270 Barrett's esophagus without dysplasia: Secondary | ICD-10-CM

## 2022-12-31 DIAGNOSIS — Z7984 Long term (current) use of oral hypoglycemic drugs: Secondary | ICD-10-CM | POA: Insufficient documentation

## 2022-12-31 DIAGNOSIS — D126 Benign neoplasm of colon, unspecified: Secondary | ICD-10-CM | POA: Diagnosis not present

## 2022-12-31 HISTORY — PX: ESOPHAGOGASTRODUODENOSCOPY: SHX5428

## 2022-12-31 HISTORY — PX: COLONOSCOPY WITH PROPOFOL: SHX5780

## 2022-12-31 LAB — GLUCOSE, CAPILLARY: Glucose-Capillary: 178 mg/dL — ABNORMAL HIGH (ref 70–99)

## 2022-12-31 SURGERY — COLONOSCOPY WITH PROPOFOL
Anesthesia: General

## 2022-12-31 MED ORDER — PROPOFOL 500 MG/50ML IV EMUL
INTRAVENOUS | Status: DC | PRN
  Administered 2022-12-31: 100 ug/kg/min via INTRAVENOUS

## 2022-12-31 MED ORDER — SODIUM CHLORIDE 0.9 % IV SOLN: INTRAVENOUS | Status: DC

## 2022-12-31 MED ORDER — DEXMEDETOMIDINE HCL IN NACL 200 MCG/50ML IV SOLN
INTRAVENOUS | Status: DC | PRN
  Administered 2022-12-31: 20 ug via INTRAVENOUS

## 2022-12-31 MED ORDER — LIDOCAINE HCL (PF) 2 % IJ SOLN
INTRAMUSCULAR | Status: AC
  Filled 2022-12-31: qty 5

## 2022-12-31 MED ORDER — PROPOFOL 10 MG/ML IV BOLUS
INTRAVENOUS | Status: DC | PRN
  Administered 2022-12-31: 50 mg via INTRAVENOUS

## 2022-12-31 MED ORDER — PROPOFOL 1000 MG/100ML IV EMUL
INTRAVENOUS | Status: AC
  Filled 2022-12-31: qty 100

## 2022-12-31 MED ORDER — LIDOCAINE HCL (CARDIAC) PF 100 MG/5ML IV SOSY
PREFILLED_SYRINGE | INTRAVENOUS | Status: DC | PRN
  Administered 2022-12-31: 100 mg via INTRAVENOUS

## 2022-12-31 NOTE — Transfer of Care (Signed)
Immediate Anesthesia Transfer of Care Note  Patient: DERVIN FICHERA  Procedure(s) Performed: COLONOSCOPY WITH PROPOFOL ESOPHAGOGASTRODUODENOSCOPY (EGD)  Patient Location: PACU  Anesthesia Type:General  Level of Consciousness: sedated  Airway & Oxygen Therapy: Patient Spontanous Breathing  Post-op Assessment: Report given to RN and Post -op Vital signs reviewed and stable  Post vital signs: Reviewed and stable  Last Vitals:  Vitals Value Taken Time  BP 94/60 12/31/22 0821  Temp    Pulse 52 12/31/22 0821  Resp 15 12/31/22 0821  SpO2 95 % 12/31/22 0821    Last Pain:  Vitals:   12/31/22 0821  TempSrc:   PainSc: Asleep         Complications: No notable events documented.

## 2022-12-31 NOTE — H&P (Signed)
Lucilla Lame, MD [address] [place], [place] [address] Phone:[phone] Fax : [phone]  Primary Care Physician:  Eulas Post, MD Primary Gastroenterologist:  Dr. Allen Norris  Pre-Procedure History & Physical: HPI:  Steven Miranda is a 77 y.o. male is here for an endoscopy and colonoscopy.   Past Medical History:  Diagnosis Date   Actinic keratosis    Arthritis    right thumb   Broken wrist    Diabetes mellitus without complication    Dysuria    GERD (gastroesophageal reflux disease)    Hyperlipidemia    Hypertension    Wears contact lenses     Past Surgical History:  Procedure Laterality Date   BROW LIFT Right 06/14/2022   Procedure: BLEPHAROPTOSIS REPAIR; RESECT EX RIGHT DIABETIC;  Surgeon: Karle Starch, MD;  Location: [place];  Service: Ophthalmology;  Laterality: Right;   COLONOSCOPY  2005   COLONOSCOPY WITH PROPOFOL N/A 10/16/2016   Procedure: COLONOSCOPY WITH PROPOFOL;  Surgeon: Robert Bellow, MD;  Location: [facility] ENDOSCOPY;  Service: Endoscopy;  Laterality: N/A;   COLONOSCOPY WITH PROPOFOL N/A 12/25/2021   Procedure: COLONOSCOPY WITH PROPOFOL;  Surgeon: Lucilla Lame, MD;  Location: [facility] ENDOSCOPY;  Service: Endoscopy;  Laterality: N/A;   ESOPHAGOGASTRODUODENOSCOPY (EGD) WITH PROPOFOL N/A 12/13/2019   Procedure: ESOPHAGOGASTRODUODENOSCOPY (EGD) WITH BIOPSY;  Surgeon: Lucilla Lame, MD;  Location: [place];  Service: Endoscopy;  Laterality: N/A;  Diabetic - oral meds   PLANTAR'S WART EXCISION     STRABISMUS SURGERY Right     Prior to Admission medications   Medication Sig Start Date End Date Taking? Authorizing Provider  Dapagliflozin-sAXagliptin (QTERN) 10-5 MG TABS Take 1 tablet by mouth daily. 04/11/22  Yes Eulas Post, MD  diltiazem (CARDIZEM CD) 120 MG 24 hr capsule Take 1 capsule (120 mg total) by mouth daily. 11/12/22  Yes Kate Sable, MD  empagliflozin (JARDIANCE) 25 MG TABS tablet Take 1 tablet (25 mg  total) by mouth daily before breakfast. 04/16/22  Yes Eulas Post, MD  metFORMIN (GLUCOPHAGE) 1000 MG tablet Take 1 tablet (1,000 mg total) by mouth 2 (two) times daily with a meal. 09/17/22  Yes Simmons-Robinson, Mak[place], MD  pioglitazone (ACTOS) 15 MG tablet Take 1 tablet (15 mg total) by mouth daily. 09/17/22  Yes Simmons-Robinson, Makiera, MD  sucralfate (CARAFATE) 1 g tablet Take 1 tablet (1 g total) by mouth 4 (four) times daily -  with meals and at bedtime. 08/20/22  Yes Lucilla Lame, MD  ACCU-CHEK SMARTVIEW test strip CHECK  FASTING  BLOOD  SUGAR EVERY MORNING AS INSTRUCTED 10/29/16   Chrismon, Vickki Muff, PA-C  atorvastatin (LIPITOR) 20 MG tablet Take 1 tablet (20 mg total) by mouth daily. 11/12/22   Kate Sable, MD  Blood Glucose Monitoring Suppl (ACCU-CHEK NANO SMARTVIEW) w/Device KIT Use glucometer (Accu-Chek Nano Glucometer) to test fasting glucose daily. 12/31/16   Chrismon, Vickki Muff, PA-C  erythromycin ophthalmic ointment Apply to sutures 4 times a day for 10-12 days.  Discontinue if allergy develops and call our office 06/14/22   Karle Starch, MD  Ferrous Sulfate (IRON SUPPLEMENT PO) Take 1 tablet by mouth daily.    [provider]  fluticasone (FLONASE) 50 MCG/ACT nasal spray Place into both nostrils as needed.    [provider]  glucose blood (TRUE METRIX BLOOD GLUCOSE TEST) test strip Use as instructed 02/27/22   Eulas Post, MD  Lancets [facility] ULTRASOFT) lancets Use as instructed once a day for  fasting glucose check. 03/30/15   Chrismon, Vickki Muff, PA-C  lidocaine (XYLOCAINE) 2 % solution Mix 5 ml with 5 ml of liquid antacid for acute indigetion pain and swallow. May repeat 2-3 times in a day if needed. 08/10/19   Chrismon, Vickki Muff, PA-C  Misc Natural Products (OSTEO BI-FLEX ADV DOUBLE ST) CAPS Take 1 capsule by mouth daily.    [provider]  pantoprazole (PROTONIX) 40 MG tablet Take 1 tablet (40 mg total) by mouth daily. 10/07/22   Lucilla Lame, MD  Probiotic Product (PROBIOTIC FORMULA) CAPS Take by mouth daily.    [provider]  traMADol (ULTRAM) 50 MG tablet Take 1 every 4-6 hours as needed for pain not controlled by Tylenol 06/14/22   Karle Starch, MD    Allergies as of 08/21/2022   (No Known Allergies)    Family History  Problem Relation Age of Onset   Cancer Mother    Alcohol abuse Father    Heart disease Maternal Uncle    Rheumatic fever Maternal Uncle    Diabetes Paternal Grandmother    Emphysema Paternal Grandfather     Social History   Socioeconomic History   Marital status: Married    Spouse name: Not on file   Number of children: 3   Years of education: Not on file   Highest education level: Bachelor's degree (e.g., BA, AB, BS)  Occupational History   Not on file  Tobacco Use   Smoking status: Former    Types: Cigarettes    Quit date: 1978    Years since quitting: 46.2   Smokeless tobacco: Never   Tobacco comments:    quit in 1978  Vaping Use   Vaping Use: Never used  Substance and Sexual Activity   Alcohol use: Yes    Alcohol/week: 6.0 standard drinks of alcohol    Types: 6 Cans of beer per week    Comment: a couple beers on the weekends   Drug use: No   Sexual activity: Not on file  Other Topics Concern   Not on file  Social History Narrative   Not on file   Social Determinants of Health   Financial Resource Strain: Low Risk  (08/03/2021)   Overall Financial Resource Strain (CARDIA)    Difficulty of Paying Living Expenses: Not hard at all  Food Insecurity: No Food Insecurity (08/03/2021)   Hunger Vital Sign    Worried About Running Out of Food in the Last Year: Never true    Ran Out of Food in the Last Year: Never true  Transportation Needs: No Transportation Needs (08/03/2021)   PRAPARE - Hydrologist (Medical): No    Lack of Transportation (Non-Medical): No  Physical Activity: Sufficiently Active (08/03/2021)   Exercise Vital Sign     Days of Exercise per Week: 4 days    Minutes of Exercise per Session: 60 min  Stress: No Stress Concern Present (08/03/2021)   [place]    Feeling of Stress : Not at all  Social Connections: [place] (08/03/2021)   Social Connection and Isolation Panel [NHANES]    Frequency of Communication with Friends and Family: More than three times a week    Frequency of Social Gatherings with Friends and Family: More than three times a week    Attends Religious Services: More than 4 times per year    Active Member of [employer] or Organizations: Yes  Attends Archivist Meetings: 1 to 4 times per year    Marital Status: Married  Human resources officer Violence: Not At Risk (07/17/2020)   Humiliation, Afraid, Rape, and Kick questionnaire    Fear of Current or Ex-Partner: No    Emotionally Abused: No    Physically Abused: No    Sexually Abused: No    Review of Systems: See HPI, otherwise negative ROS  Physical Exam: BP (!) 166/93   Pulse 73   Temp (!) 96.7 F (35.9 C) (Temporal)   Resp 18   Ht 6\' 2"  (1.88 m)   Wt 116.6 kg   SpO2 97%   BMI 33.00 kg/m  General:   Alert,  pleasant and cooperative in NAD Head:  Normocephalic and atraumatic. Neck:  Supple; no masses or thyromegaly. Lungs:  Clear throughout to auscultation.    Heart:  Regular rate and rhythm. Abdomen:  Soft, nontender and nondistended. Normal bowel sounds, without guarding, and without rebound.   Neurologic:  Alert and  oriented x4;  grossly normal neurologically.  Impression/Plan: Steven Miranda is here for an endoscopy and colonoscopy to be performed for barretts and history of adenomatous polyps 2023  Risks, benefits, limitations, and alternatives regarding  endoscopy and colonoscopy have been reviewed with the patient.  Questions have been answered.  All parties agreeable.   Lucilla Lame, MD  12/31/2022, 7:46 AM

## 2022-12-31 NOTE — Op Note (Signed)
[facility] Gastroenterology Patient Name: Steven Miranda Procedure Date: 12/31/2022 7:29 AM MRN: [id number] Account #: [id number] Date of Birth: 09/17/1946 Admit Type: Outpatient Age: 77 Room: [facility] ENDO ROOM 4 Gender: Male Note Status: Finalized Instrument Name: Upper Endoscope [id number] Procedure:             Upper GI endoscopy Indications:           Follow-up of Barrett's esophagus Providers:             Lucilla Lame MD, MD Medicines:             Propofol per Anesthesia Complications:         No immediate complications. Procedure:             Pre-Anesthesia Assessment:                        - Prior to the procedure, a History and Physical was                         performed, and patient medications and allergies were                         reviewed. The patient's tolerance of previous                         anesthesia was also reviewed. The risks and benefits                         of the procedure and the sedation options and risks                         were discussed with the patient. All questions were                         answered, and informed consent was obtained. Prior                         Anticoagulants: The patient has taken no anticoagulant                         or antiplatelet agents. ASA Grade Assessment: II - A                         patient with mild systemic disease. After reviewing                         the risks and benefits, the patient was deemed in                         satisfactory condition to undergo the procedure.                        After obtaining informed consent, the endoscope was                         passed under direct vision. Throughout the procedure,                         the  patient's blood pressure, pulse, and oxygen                         saturations were monitored continuously. The Endoscope                         was introduced through the mouth, and advanced to the                         second  part of duodenum. The upper GI endoscopy was                         accomplished without difficulty. The patient tolerated                         the procedure well. Findings:      There were esophageal mucosal changes consistent with short-segment       Barrett's esophagus present in the lower third of the esophagus. The       maximum longitudinal extent of these mucosal changes was 2 cm in length.       Mucosa was biopsied with a cold forceps for histology in the lower third       of the esophagus.      The stomach was normal.      The in the duodenum was normal. Impression:            - Esophageal mucosal changes consistent with                         short-segment Barrett's esophagus. Biopsied.                        - Normal stomach.                        - Normal. Recommendation:        - Discharge patient to home.                        - Resume previous diet.                        - Continue present medications.                        - Await pathology results.                        - Repeat upper endoscopy in 3 years for surveillance. Procedure Code(s):     --- Professional ---                        [id number], Esophagogastroduodenoscopy, flexible,                         transoral; with biopsy, single or multiple Diagnosis Code(s):     --- Professional ---                        K22.70, Barrett's esophagus without dysplasia CPT copyright 2022 American Medical Association. All rights reserved. The codes documented in this report are preliminary and  upon coder review may  be revised to meet current compliance requirements. Lucilla Lame MD, MD 12/31/2022 8:01:16 AM This report has been signed electronically. Number of Addenda: 0 Note Initiated On: 12/31/2022 7:29 AM Estimated Blood Loss:  Estimated blood loss: none.      [facility]

## 2022-12-31 NOTE — Anesthesia Preprocedure Evaluation (Signed)
Anesthesia Evaluation  Patient identified by MRN, date of birth, ID band Patient awake    Reviewed: Allergy & Precautions, NPO status , Patient's Chart, lab work & pertinent test results  History of Anesthesia Complications Negative for: history of anesthetic complications  Airway Mallampati: II  TM Distance: >3 FB Neck ROM: full    Dental no notable dental hx.    Pulmonary neg pulmonary ROS, former smoker   Pulmonary exam normal        Cardiovascular hypertension, On Medications + dysrhythmias Atrial Fibrillation      Neuro/Psych  Neuromuscular disease  negative psych ROS   GI/Hepatic Neg liver ROS,GERD  ,,  Endo/Other  negative endocrine ROSdiabetes    Renal/GU negative Renal ROS  negative genitourinary   Musculoskeletal   Abdominal   Peds  Hematology negative hematology ROS (+)   Anesthesia Other Findings Past Medical History: No date: Actinic keratosis No date: Arthritis     Comment:  right thumb No date: Broken wrist No date: Diabetes mellitus without complication No date: Dysuria No date: GERD (gastroesophageal reflux disease) No date: Hyperlipidemia No date: Hypertension No date: Wears contact lenses  Past Surgical History: 06/14/2022: BROW LIFT; Right     Comment:  Procedure: BLEPHAROPTOSIS REPAIR; RESECT EX RIGHT               DIABETIC;  Surgeon: Karle Starch, MD;  Location: [place];  Service: Ophthalmology;  Laterality:               Right; 2005: COLONOSCOPY 10/16/2016: COLONOSCOPY WITH PROPOFOL; N/A     Comment:  Procedure: COLONOSCOPY WITH PROPOFOL;  Surgeon: Robert Bellow, MD;  Location: [facility] ENDOSCOPY;  Service:               Endoscopy;  Laterality: N/A; 12/25/2021: COLONOSCOPY WITH PROPOFOL; N/A     Comment:  Procedure: COLONOSCOPY WITH PROPOFOL;  Surgeon: Lucilla Lame, MD;  Location: [facility] ENDOSCOPY;  Service:                Endoscopy;  Laterality: N/A; 12/13/2019: ESOPHAGOGASTRODUODENOSCOPY (EGD) WITH PROPOFOL; N/A     Comment:  Procedure: ESOPHAGOGASTRODUODENOSCOPY (EGD) WITH BIOPSY;              Surgeon: Lucilla Lame, MD;  Location: [place];  Service: Endoscopy;  Laterality: N/A;  Diabetic -               oral meds No date: PLANTAR'S WART EXCISION No date: STRABISMUS SURGERY; Right     Reproductive/Obstetrics negative OB ROS                             Anesthesia Physical Anesthesia Plan  ASA: 2  Anesthesia Plan: General   Post-op Pain Management:    Induction: Intravenous  PONV Risk Score and Plan: Propofol infusion and TIVA  Airway Management Planned: Natural Airway and Nasal Cannula  Additional Equipment:   Intra-op Plan:   Post-operative Plan:   Informed Consent: I have reviewed the patients History and Physical, chart, labs and discussed the procedure including the risks, benefits and alternatives for the proposed anesthesia  with the patient or authorized representative who has indicated his/her understanding and acceptance.     Dental Advisory Given  Plan Discussed with: Anesthesiologist, CRNA and Surgeon  Anesthesia Plan Comments: (Patient consented for risks of anesthesia including but not limited to:  - adverse reactions to medications - risk of airway placement if required - damage to eyes, teeth, lips or other oral mucosa - nerve damage due to positioning  - sore throat or hoarseness - Damage to heart, brain, nerves, lungs, other parts of body or loss of life  Patient voiced understanding.)        Anesthesia Quick Evaluation

## 2022-12-31 NOTE — Anesthesia Postprocedure Evaluation (Signed)
Anesthesia Post Note  Patient: Steven Miranda  Procedure(s) Performed: COLONOSCOPY WITH PROPOFOL ESOPHAGOGASTRODUODENOSCOPY (EGD)  Patient location during evaluation: Endoscopy Anesthesia Type: General Level of consciousness: awake and alert Pain management: pain level controlled Vital Signs Assessment: post-procedure vital signs reviewed and stable Respiratory status: spontaneous breathing, nonlabored ventilation, respiratory function stable and patient connected to nasal cannula oxygen Cardiovascular status: blood pressure returned to baseline and stable Postop Assessment: no apparent nausea or vomiting Anesthetic complications: no   No notable events documented.   Last Vitals:  Vitals:   12/31/22 0830 12/31/22 0840  BP: 98/70 113/76  Pulse: (!) 56 (!) 58  Resp: 14 13  Temp:    SpO2: 95% 97%    Last Pain:  Vitals:   12/31/22 0840  TempSrc:   PainSc: 0-No pain                 Ilene Qua

## 2022-12-31 NOTE — Op Note (Signed)
[facility] Gastroenterology Patient Name: Steven Miranda Procedure Date: 12/31/2022 7:28 AM MRN: [id number] Account #: [id number] Date of Birth: December 29, 1945 Admit Type: Outpatient Age: 77 Room: [facility] ENDO ROOM 4 Gender: Male Note Status: Finalized Instrument Name: Jasper Riling [id number] Procedure:             Colonoscopy Indications:           High risk colon cancer surveillance: Personal history                         of colonic polyps Providers:             Lucilla Lame MD, MD Medicines:             Propofol per Anesthesia Complications:         No immediate complications. Procedure:             Pre-Anesthesia Assessment:                        - Prior to the procedure, a History and Physical was                         performed, and patient medications and allergies were                         reviewed. The patient's tolerance of previous                         anesthesia was also reviewed. The risks and benefits                         of the procedure and the sedation options and risks                         were discussed with the patient. All questions were                         answered, and informed consent was obtained. Prior                         Anticoagulants: The patient has taken no anticoagulant                         or antiplatelet agents. ASA Grade Assessment: II - A                         patient with mild systemic disease. After reviewing                         the risks and benefits, the patient was deemed in                         satisfactory condition to undergo the procedure.                        After obtaining informed consent, the colonoscope was                         passed under direct vision. Throughout  the procedure,                         the patient's blood pressure, pulse, and oxygen                         saturations were monitored continuously. The                         Colonoscope was introduced through the anus  and                         advanced to the the cecum, identified by appendiceal                         orifice and ileocecal valve. The colonoscopy was                         performed without difficulty. The patient tolerated                         the procedure well. The quality of the bowel                         preparation was excellent. Findings:      The perianal and digital rectal examinations were normal.      A 2 mm polyp was found in the transverse colon. The polyp was sessile.       The polyp was removed with a cold biopsy forceps. Resection and       retrieval were complete.      A 4 mm polyp was found in the descending colon. The polyp was sessile.       The polyp was removed with a cold snare. Resection and retrieval were       complete.      A 4 mm polyp was found in the sigmoid colon. The polyp was sessile. The       polyp was removed with a cold snare. Resection and retrieval were       complete.      Non-bleeding internal hemorrhoids were found during retroflexion. The       hemorrhoids were Grade I (internal hemorrhoids that do not prolapse). Impression:            - One 2 mm polyp in the transverse colon, removed with                         a cold biopsy forceps. Resected and retrieved.                        - One 4 mm polyp in the descending colon, removed with                         a cold snare. Resected and retrieved.                        - One 4 mm polyp in the sigmoid colon, removed with a                         cold  snare. Resected and retrieved.                        - Non-bleeding internal hemorrhoids. Recommendation:        - Discharge patient to home.                        - Resume previous diet.                        - Continue present medications.                        - Repeat colonoscopy in 3 years for surveillance. Procedure Code(s):     --- Professional ---                        [phone], Colonoscopy, flexible; with removal of                          tumor(s), polyp(s), or other lesion(s) by snare                         technique                        [id number], 25, Colonoscopy, flexible; with biopsy, single                         or multiple Diagnosis Code(s):     --- Professional ---                        Z86.010, Personal history of colonic polyps                        D12.4, Benign neoplasm of descending colon CPT copyright 2022 American Medical Association. All rights reserved. The codes documented in this report are preliminary and upon coder review may  be revised to meet current compliance requirements. Lucilla Lame MD, MD 12/31/2022 8:19:38 AM This report has been signed electronically. Number of Addenda: 0 Note Initiated On: 12/31/2022 7:28 AM Scope Withdrawal Time: 0 hours 11 minutes 47 seconds  Total Procedure Duration: 0 hours 14 minutes 5 seconds  Estimated Blood Loss:  Estimated blood loss: none.      [facility]

## 2023-01-01 ENCOUNTER — Encounter: Payer: Self-pay | Admitting: Gastroenterology

## 2023-01-01 LAB — SURGICAL PATHOLOGY

## 2023-01-02 ENCOUNTER — Encounter: Payer: Self-pay | Admitting: Gastroenterology

## 2023-02-25 ENCOUNTER — Encounter: Payer: Self-pay | Admitting: Dermatology

## 2023-02-25 ENCOUNTER — Ambulatory Visit (INDEPENDENT_AMBULATORY_CARE_PROVIDER_SITE_OTHER): Payer: Medicare HMO | Admitting: Dermatology

## 2023-02-25 VITALS — BP 132/75 | HR 71

## 2023-02-25 DIAGNOSIS — L578 Other skin changes due to chronic exposure to nonionizing radiation: Secondary | ICD-10-CM

## 2023-02-25 DIAGNOSIS — I781 Nevus, non-neoplastic: Secondary | ICD-10-CM

## 2023-02-25 DIAGNOSIS — D225 Melanocytic nevi of trunk: Secondary | ICD-10-CM

## 2023-02-25 DIAGNOSIS — L814 Other melanin hyperpigmentation: Secondary | ICD-10-CM | POA: Diagnosis not present

## 2023-02-25 DIAGNOSIS — L570 Actinic keratosis: Secondary | ICD-10-CM | POA: Diagnosis not present

## 2023-02-25 DIAGNOSIS — D692 Other nonthrombocytopenic purpura: Secondary | ICD-10-CM

## 2023-02-25 DIAGNOSIS — Z1283 Encounter for screening for malignant neoplasm of skin: Secondary | ICD-10-CM

## 2023-02-25 DIAGNOSIS — L821 Other seborrheic keratosis: Secondary | ICD-10-CM | POA: Diagnosis not present

## 2023-02-25 DIAGNOSIS — X32XXXA Exposure to sunlight, initial encounter: Secondary | ICD-10-CM

## 2023-02-25 DIAGNOSIS — W908XXA Exposure to other nonionizing radiation, initial encounter: Secondary | ICD-10-CM | POA: Diagnosis not present

## 2023-02-25 NOTE — Progress Notes (Addendum)
Follow-Up Visit   Subjective  Steven Miranda is a 77 y.o. male who presents for the following: Skin Cancer Screening and Full Body Skin Exam Hx of isks   The patient presents for Total-Body Skin Exam (TBSE) for skin cancer screening and mole check. The patient has spots, moles and lesions to be evaluated, some may be new or changing and the patient has concerns that these could be cancer.    The following portions of the chart were reviewed this encounter and updated as appropriate: medications, allergies, medical history  Review of Systems:  No other skin or systemic complaints except as noted in HPI or Assessment and Plan.  Objective  Well appearing patient in no apparent distress; mood and affect are within normal limits.  A full examination was performed including scalp, head, eyes, ears, nose, lips, neck, chest, axillae, abdomen, back, buttocks, bilateral upper extremities, bilateral lower extremities, hands, feet, fingers, toes, fingernails, and toenails. All findings within normal limits unless otherwise noted below.   Relevant physical exam findings are noted in the Assessment and Plan.  right inferior jaw x1, left mid cheek x 1, right hand MCP 2nd and 3rd x 2 (4) Erythematous thin papules/macules with gritty scale.     Assessment & Plan  Actinic keratosis (4) right inferior jaw x1, left mid cheek x 1, right hand MCP 2nd and 3rd x 2  Vs ISK at right inferior jaw and left mid cheek  Recheck R inf jaw on f/up  Actinic keratoses are precancerous spots that appear secondary to cumulative UV radiation exposure/sun exposure over time. They are chronic with expected duration over 1 year. A portion of actinic keratoses will progress to squamous cell carcinoma of the skin. It is not possible to reliably predict which spots will progress to skin cancer and so treatment is recommended to prevent development of skin cancer.  Recommend daily broad spectrum sunscreen SPF 30+ to  sun-exposed areas, reapply every 2 hours as needed.  Recommend staying in the shade or wearing long sleeves, sun glasses (UVA+UVB protection) and wide brim hats (4-inch brim around the entire circumference of the hat). Call for new or changing lesions.  Destruction of lesion - right inferior jaw x1, left mid cheek x 1, right hand MCP 2nd and 3rd x 2  Destruction method: cryotherapy   Informed consent: discussed and consent obtained   Lesion destroyed using liquid nitrogen: Yes   Region frozen until ice ball extended beyond lesion: Yes   Outcome: patient tolerated procedure well with no complications   Post-procedure details: wound care instructions given   Additional details:  Prior to procedure, discussed risks of blister formation, small wound, skin dyspigmentation, or rare scar following cryotherapy. Recommend Vaseline ointment to treated areas while healing.   Skin cancer screening  Actinic skin damage  Lentigo  Seborrheic keratosis  Telangiectasia  Senile purpura (HCC)    LENTIGINES, SEBORRHEIC KERATOSES, HEMANGIOMAS - Benign normal skin lesions - Benign-appearing - Call for any changes  MELANOCYTIC NEVI - Tan-brown and/or pink-flesh-colored symmetric macules and papules - Benign appearing on exam today - Observation - Call clinic for new or changing moles - Recommend daily use of broad spectrum spf 30+ sunscreen to sun-exposed areas.  Nevus Left Lower Back   0.5 cm medium brown macule.   Benign-appearing.  Stable. Observation.  Call clinic for new or changing lesions.  Recommend daily use of broad spectrum spf 30+ sunscreen to sun-exposed areas.     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  Stable. Observation.  Call clinic for new or changing lesions.  Recommend daily use of broad spectrum spf 30+ sunscreen to sun-exposed areas.   Telangiectasias. Cheeks.  - Dilated blood  vessel - Benign appearing on exam - Call for changes  Purpura - Chronic; persistent and recurrent.  Treatable, but not curable. - Violaceous macules and patches - Benign - Related to trauma, age, sun damage and/or use of blood thinners, chronic use of topical and/or oral steroids - Observe - Can use OTC arnica containing moisturizer such as Dermend Bruise Formula if desired - Call for worsening or other concerns   ACTINIC DAMAGE - Chronic condition, secondary to cumulative UV/sun exposure - diffuse scaly erythematous macules with underlying dyspigmentation - Recommend daily broad spectrum sunscreen SPF 30+ to sun-exposed areas, reapply every 2 hours as needed.  - Staying in the shade or wearing long sleeves, sun glasses (UVA+UVB protection) and wide brim hats (4-inch brim around the entire circumference of the hat) are also recommended for sun protection.  - Call for new or changing lesions.  SKIN CANCER SCREENING PERFORMED TODAY.   Return in about 1 year (around 02/25/2024) for TBSE.  I, Asher Muir, CMA, am acting as scribe for Willeen Niece, MD.   Documentation: I have reviewed the above documentation for accuracy and completeness, and I agree with the above.  Willeen Niece, MD

## 2023-02-25 NOTE — Patient Instructions (Addendum)
For Bruising at arms   Related to trauma, age, sun damage and/or use of blood thinners, chronic use of topical and/or oral steroids  Can use OTC arnica containing moisturizer such as Dermend Bruise Formula if desired   Actinic keratoses are precancerous spots that appear secondary to cumulative UV radiation exposure/sun exposure over time. They are chronic with expected duration over 1 year. A portion of actinic keratoses will progress to squamous cell carcinoma of the skin. It is not possible to reliably predict which spots will progress to skin cancer and so treatment is recommended to prevent development of skin cancer.  Recommend daily broad spectrum sunscreen SPF 30+ to sun-exposed areas, reapply every 2 hours as needed.  Recommend staying in the shade or wearing long sleeves, sun glasses (UVA+UVB protection) and wide brim hats (4-inch brim around the entire circumference of the hat). Call for new or changing lesions.   Cryotherapy Aftercare  Wash gently with soap and water everyday.   Apply Vaseline and Band-Aid daily until healed.   Seborrheic Keratosis  What causes seborrheic keratoses? Seborrheic keratoses are harmless, common skin growths that first appear during adult life.  As time goes by, more growths appear.  Some people may develop a large number of them.  Seborrheic keratoses appear on both covered and uncovered body parts.  They are not caused by sunlight.  The tendency to develop seborrheic keratoses can be inherited.  They vary in color from skin-colored to gray, brown, or even black.  They can be either smooth or have a rough, warty surface.   Seborrheic keratoses are superficial and look as if they were stuck on the skin.  Under the microscope this type of keratosis looks like layers upon layers of skin.  That is why at times the top layer may seem to fall off, but the rest of the growth remains and re-grows.    Treatment Seborrheic keratoses do not need to be treated,  but can easily be removed in the office.  Seborrheic keratoses often cause symptoms when they rub on clothing or jewelry.  Lesions can be in the way of shaving.  If they become inflamed, they can cause itching, soreness, or burning.  Removal of a seborrheic keratosis can be accomplished by freezing, burning, or surgery. If any spot bleeds, scabs, or grows rapidly, please return to have it checked, as these can be an indication of a skin cancer.     Melanoma ABCDEs  Melanoma is the most dangerous type of skin cancer, and is the leading cause of death from skin disease.  You are more likely to develop melanoma if you: Have light-colored skin, light-colored eyes, or red or blond hair Spend a lot of time in the sun Tan regularly, either outdoors or in a tanning bed Have had blistering sunburns, especially during childhood Have a close family member who has had a melanoma Have atypical moles or large birthmarks  Early detection of melanoma is key since treatment is typically straightforward and cure rates are extremely high if we catch it early.   The first sign of melanoma is often a change in a mole or a new dark spot.  The ABCDE system is a way of remembering the signs of melanoma.  A for asymmetry:  The two halves do not match. B for border:  The edges of the growth are irregular. C for color:  A mixture of colors are present instead of an even brown color. D for diameter:  Melanomas are  usually (but not always) greater than 6mm - the size of a pencil eraser. E for evolution:  The spot keeps changing in size, shape, and color.  Please check your skin once per month between visits. You can use a small mirror in front and a large mirror behind you to keep an eye on the back side or your body.   If you see any new or changing lesions before your next follow-up, please call to schedule a visit.  Please continue daily skin protection including broad spectrum sunscreen SPF 30+ to sun-exposed  areas, reapplying every 2 hours as needed when you're outdoors.   Staying in the shade or wearing long sleeves, sun glasses (UVA+UVB protection) and wide brim hats (4-inch brim around the entire circumference of the hat) are also recommended for sun protection.    Due to recent changes in healthcare laws, you may see results of your pathology and/or laboratory studies on MyChart before the doctors have had a chance to review them. We understand that in some cases there may be results that are confusing or concerning to you. Please understand that not all results are received at the same time and often the doctors may need to interpret multiple results in order to provide you with the best plan of care or course of treatment. Therefore, we ask that you please give [place] 2 business days to thoroughly review all your results before contacting the office for clarification. Should we see a critical lab result, you will be contacted sooner.   If You Need Anything After Your Visit  If you have any questions or concerns for your doctor, please call our main line at [phone] and press option 4 to reach your doctor's medical assistant. If no one answers, please leave a voicemail as directed and we will return your call as soon as possible. Messages left after 4 pm will be answered the following business day.   You may also send [place] a message via MyChart. We typically respond to MyChart messages within 1-2 business days.  For prescription refills, please ask your pharmacy to contact our office. Our fax number is [phone].  If you have an urgent issue when the clinic is closed that cannot wait until the next business day, you can page your doctor at the number below.    Please note that while we do our best to be available for urgent issues outside of office hours, we are not available 24/7.   If you have an urgent issue and are unable to reach [place], you may choose to seek medical care at your doctor's  office, retail clinic, urgent care center, or emergency room.  If you have a medical emergency, please immediately call 911 or go to the emergency department.  Pager Numbers  - Dr. Gwen Pounds: [phone]  - Dr. Neale Burly: [phone]  - Dr. Roseanne Reno: [phone]  In the event of inclement weather, please call our main line at [phone] for an update on the status of any delays or closures.  Dermatology Medication Tips: Please keep the boxes that topical medications come in in order to help keep track of the instructions about where and how to use these. Pharmacies typically print the medication instructions only on the boxes and not directly on the medication tubes.   If your medication is too expensive, please contact our office at [phone] option 4 or send [place] a message through MyChart.   We are unable to tell what your co-pay for medications will be  in advance as this is different depending on your insurance coverage. However, we may be able to find a substitute medication at lower cost or fill out paperwork to get insurance to cover a needed medication.   If a prior authorization is required to get your medication covered by your insurance company, please allow [place] 1-2 business days to complete this process.  Drug prices often vary depending on where the prescription is filled and some pharmacies may offer cheaper prices.  The website www.goodrx.com contains coupons for medications through different pharmacies. The prices here do not account for what the cost may be with help from insurance (it may be cheaper with your insurance), but the website can give you the price if you did not use any insurance.  - You can print the associated coupon and take it with your prescription to the pharmacy.  - You may also stop by our office during regular business hours and pick up a GoodRx coupon card.  - If you need your prescription sent electronically to a different pharmacy, notify our office  through [facility] or by phone at [phone] option 4.     Si Usted Necesita Algo Despus de Su Visita  Tambin puede enviarnos un mensaje a travs de Clinical cytogeneticist. Por lo general respondemos a los mensajes de MyChart en el transcurso de 1 a 2 das hbiles.  Para renovar recetas, por favor pida a su farmacia que se ponga en contacto con nuestra oficina. Annie Sable de fax es [place] [phone].  Si tiene un asunto urgente cuando la clnica est cerrada y que no puede esperar hasta el siguiente da hbil, puede llamar/localizar a su doctor(a) al nmero que aparece a continuacin.   Por favor, tenga en cuenta que aunque hacemos todo lo posible para estar disponibles para asuntos urgentes fuera del horario de [place], no estamos disponibles las 24 horas del da, los 7 [address]  [address] de la [place].   Si tiene un problema urgente y no puede comunicarse con nosotros, puede optar por buscar atencin mdica  en el consultorio de su doctor(a), en una clnica privada, en un centro de atencin urgente o en una sala de emergencias.  Si tiene Engineer, drilling, por favor llame inmediatamente al 911 o vaya a la sala de emergencias.  Nmeros de bper  - Dr. Gwen Pounds: [phone]  - Dra. Moye: [phone]  - Dra. Roseanne Reno: [phone]  En caso de inclemencias del [place], por favor llame a Lacy Duverney principal al [phone] para una actualizacin sobre el [place] de cualquier retraso o cierre.  Consejos para la medicacin en dermatologa: Por favor, guarde las cajas en las que vienen los medicamentos de uso tpico para ayudarle a seguir las instrucciones sobre dnde y cmo usarlos. Las farmacias generalmente imprimen las instrucciones del medicamento slo en las cajas y no directamente en los tubos del [place].   Si su medicamento es muy caro, por favor, pngase en contacto con Rolm Gala llamando al [phone] y presione la opcin 4 o envenos un mensaje a travs de Clinical cytogeneticist.   No  podemos decirle cul ser su copago por los medicamentos por adelantado ya que esto es diferente dependiendo de la cobertura de su seguro. Sin embargo, es posible que podamos encontrar un medicamento sustituto a Audiological scientist un formulario para que el seguro cubra el medicamento que se considera necesario.   Si se requiere una autorizacin previa para que su compaa de seguros [place] su medicamento, por favor permtanos de 1 a  2 das hbiles para completar [address]  Los precios de los medicamentos varan con frecuencia dependiendo del Environmental consultant de dnde se surte la receta y alguna farmacias pueden ofrecer precios ms baratos.  El sitio web www.goodrx.com tiene cupones para medicamentos de Health and safety inspector. Los precios aqu no tienen en cuenta lo que podra costar con la ayuda del seguro (puede ser ms barato con su seguro), pero el sitio web puede darle el precio si no utiliz Tourist information centre manager.  - Puede imprimir el cupn correspondiente y llevarlo con su receta a la farmacia.  - Tambin puede pasar por nuestra oficina durante el horario de atencin regular y Education officer, museum una tarjeta de cupones de GoodRx.  - Si necesita que su receta se enve electrnicamente a una farmacia diferente, informe a nuestra oficina a travs de MyChart de [place] o por telfono llamando al [phone] y presione la opcin 4.

## 2023-03-17 DIAGNOSIS — J029 Acute pharyngitis, unspecified: Secondary | ICD-10-CM | POA: Diagnosis not present

## 2023-03-17 DIAGNOSIS — R5383 Other fatigue: Secondary | ICD-10-CM | POA: Diagnosis not present

## 2023-03-17 DIAGNOSIS — J019 Acute sinusitis, unspecified: Secondary | ICD-10-CM | POA: Diagnosis not present

## 2023-03-19 DIAGNOSIS — K2270 Barrett's esophagus without dysplasia: Secondary | ICD-10-CM | POA: Diagnosis not present

## 2023-03-19 DIAGNOSIS — I10 Essential (primary) hypertension: Secondary | ICD-10-CM | POA: Diagnosis not present

## 2023-03-19 DIAGNOSIS — Z86010 Personal history of colonic polyps: Secondary | ICD-10-CM | POA: Diagnosis not present

## 2023-03-19 DIAGNOSIS — Z0000 Encounter for general adult medical examination without abnormal findings: Secondary | ICD-10-CM | POA: Diagnosis not present

## 2023-03-19 DIAGNOSIS — E7801 Familial hypercholesterolemia: Secondary | ICD-10-CM | POA: Diagnosis not present

## 2023-03-19 DIAGNOSIS — E119 Type 2 diabetes mellitus without complications: Secondary | ICD-10-CM | POA: Diagnosis not present

## 2023-03-20 ENCOUNTER — Other Ambulatory Visit: Payer: Self-pay | Admitting: Family Medicine

## 2023-03-20 DIAGNOSIS — E119 Type 2 diabetes mellitus without complications: Secondary | ICD-10-CM

## 2023-03-20 NOTE — Telephone Encounter (Signed)
Unable to refill per protocol, PCP is Dr. Sullivan Lone last OV 03/19/23.  Requested Prescriptions  Pending Prescriptions Disp Refills   pioglitazone (ACTOS) 15 MG tablet [Pharmacy Med Name: PIOGLITAZONE HYDROCHLORIDE 15 MG Tablet] 90 tablet 3    Sig: TAKE 1 TABLET EVERY DAY     Endocrinology:  Diabetes - Glitazones - pioglitazone Failed - 03/20/2023  9:17 AM      Failed - HBA1C is between 0 and 7.9 and within 180 days    Hemoglobin A1C  Date Value Ref Range Status  05/14/2022 6.7 (A) 4.0 - 5.6 % Final   HbA1c, POC (prediabetic range)  Date Value Ref Range Status  08/20/2019 7.9 (A) 5.7 - 6.4 % Corrected   HbA1c, POC (controlled diabetic range)  Date Value Ref Range Status  08/20/2019 7.0 0.0 - 7.0 % Corrected   HbA1c POC (<> result, manual entry)  Date Value Ref Range Status  08/20/2019 7.9 4.0 - 5.6 % Final   Hgb A1c MFr Bld  Date Value Ref Range Status  11/14/2021 6.7 (H) 4.8 - 5.6 % Final    Comment:             Prediabetes: 5.7 - 6.4          Diabetes: >6.4          Glycemic control for adults with diabetes: <7.0          Failed - Valid encounter within last 6 months    Recent Outpatient Visits           10 months ago Type 2 diabetes mellitus without complication, without long-term current use of insulin (HCC)   [facility] Bosie Clos, MD   11 months ago Dehydration   [place] Bosie Clos, MD   1 year ago Encounter for Medicare annual wellness exam   [facility] Bosie Clos, MD   1 year ago URI, acute   [facility] Alfredia Ferguson, PA-C   2 years ago Elevated heart rate with elevated blood pressure and diagnosis of hypertension   [facility] [place], Ricki Rodriguez [place], [place]

## 2023-05-25 DIAGNOSIS — M545 Low back pain, unspecified: Secondary | ICD-10-CM | POA: Diagnosis not present

## 2023-06-03 DIAGNOSIS — M5416 Radiculopathy, lumbar region: Secondary | ICD-10-CM | POA: Diagnosis not present

## 2023-06-26 DIAGNOSIS — M5416 Radiculopathy, lumbar region: Secondary | ICD-10-CM | POA: Diagnosis not present

## 2023-07-03 DIAGNOSIS — M5416 Radiculopathy, lumbar region: Secondary | ICD-10-CM | POA: Diagnosis not present

## 2023-07-10 DIAGNOSIS — M5416 Radiculopathy, lumbar region: Secondary | ICD-10-CM | POA: Diagnosis not present

## 2023-07-18 DIAGNOSIS — M5416 Radiculopathy, lumbar region: Secondary | ICD-10-CM | POA: Diagnosis not present

## 2023-07-22 DIAGNOSIS — I10 Essential (primary) hypertension: Secondary | ICD-10-CM | POA: Diagnosis not present

## 2023-07-22 DIAGNOSIS — K2270 Barrett's esophagus without dysplasia: Secondary | ICD-10-CM | POA: Diagnosis not present

## 2023-07-22 DIAGNOSIS — E119 Type 2 diabetes mellitus without complications: Secondary | ICD-10-CM | POA: Diagnosis not present

## 2023-07-22 DIAGNOSIS — E7800 Pure hypercholesterolemia, unspecified: Secondary | ICD-10-CM | POA: Diagnosis not present

## 2023-07-24 DIAGNOSIS — M5416 Radiculopathy, lumbar region: Secondary | ICD-10-CM | POA: Diagnosis not present

## 2023-07-31 DIAGNOSIS — M5416 Radiculopathy, lumbar region: Secondary | ICD-10-CM | POA: Diagnosis not present

## 2023-08-04 DIAGNOSIS — M5416 Radiculopathy, lumbar region: Secondary | ICD-10-CM | POA: Diagnosis not present

## 2023-08-04 DIAGNOSIS — M1711 Unilateral primary osteoarthritis, right knee: Secondary | ICD-10-CM | POA: Diagnosis not present

## 2023-08-04 DIAGNOSIS — E119 Type 2 diabetes mellitus without complications: Secondary | ICD-10-CM | POA: Diagnosis not present

## 2023-08-07 DIAGNOSIS — M5416 Radiculopathy, lumbar region: Secondary | ICD-10-CM | POA: Diagnosis not present

## 2023-08-11 DIAGNOSIS — M5416 Radiculopathy, lumbar region: Secondary | ICD-10-CM | POA: Diagnosis not present

## 2023-08-18 DIAGNOSIS — E113293 Type 2 diabetes mellitus with mild nonproliferative diabetic retinopathy without macular edema, bilateral: Secondary | ICD-10-CM | POA: Diagnosis not present

## 2023-08-22 DIAGNOSIS — R0789 Other chest pain: Secondary | ICD-10-CM | POA: Diagnosis not present

## 2023-08-22 DIAGNOSIS — E119 Type 2 diabetes mellitus without complications: Secondary | ICD-10-CM | POA: Diagnosis not present

## 2023-08-22 DIAGNOSIS — K2270 Barrett's esophagus without dysplasia: Secondary | ICD-10-CM | POA: Diagnosis not present

## 2023-08-26 ENCOUNTER — Telehealth: Payer: Self-pay | Admitting: Gastroenterology

## 2023-08-26 NOTE — Telephone Encounter (Signed)
Pt has been scheduled with Inetta Fermo next week... Pt states he has been taking Protonix daily, now along with Pepcid and carafate with minimal relief, prescribed by PCP recently.... Pt wanted to know if there should be anything additional you could recommend until his OV next week with Inetta Fermo regarding chest discomfort possibly assoc. with acid indigestion   Please advise

## 2023-08-26 NOTE — Telephone Encounter (Signed)
The patient is having burning in his chest and would like appointment with Dr. Servando Snare.

## 2023-08-27 ENCOUNTER — Other Ambulatory Visit: Payer: Self-pay

## 2023-08-27 ENCOUNTER — Emergency Department: Payer: Medicare HMO

## 2023-08-27 ENCOUNTER — Emergency Department
Admission: EM | Admit: 2023-08-27 | Discharge: 2023-08-27 | Disposition: A | Discharge status: Home / Self Care | Payer: Medicare HMO | Attending: Emergency Medicine | Admitting: Emergency Medicine

## 2023-08-27 ENCOUNTER — Encounter: Payer: Self-pay | Admitting: Emergency Medicine

## 2023-08-27 DIAGNOSIS — R0789 Other chest pain: Secondary | ICD-10-CM | POA: Diagnosis not present

## 2023-08-27 DIAGNOSIS — R079 Chest pain, unspecified: Secondary | ICD-10-CM | POA: Diagnosis not present

## 2023-08-27 DIAGNOSIS — E119 Type 2 diabetes mellitus without complications: Secondary | ICD-10-CM | POA: Insufficient documentation

## 2023-08-27 LAB — TROPONIN I (HIGH SENSITIVITY)
Troponin I (High Sensitivity): 3 ng/L (ref <18)
Troponin I (High Sensitivity): 3 ng/L (ref <18)

## 2023-08-27 LAB — BASIC METABOLIC PANEL
Anion gap: 9 (ref 5–15)
BUN: 15 mg/dL (ref 8–23)
CO2: 24 mmol/L (ref 22–32)
Calcium: 9.1 mg/dL (ref 8.9–10.3)
Chloride: 103 mmol/L (ref 98–111)
Creatinine, Ser: 0.95 mg/dL (ref 0.61–1.24)
GFR, Estimated: >60 mL/min (ref >60)
Glucose, Bld: 146 mg/dL — ABNORMAL HIGH (ref 70–99)
Potassium: 4.2 mmol/L (ref 3.5–5.1)
Sodium: 136 mmol/L (ref 135–145)

## 2023-08-27 LAB — CBC
HCT: 41.5 % (ref 39.0–52.0)
Hemoglobin: 14.9 g/dL (ref 13.0–17.0)
MCH: 32.3 pg (ref 26.0–34.0)
MCHC: 35.9 g/dL (ref 30.0–36.0)
MCV: 90 fL (ref 80.0–100.0)
Platelets: 246 10*3/uL (ref 150–400)
RBC: 4.61 MIL/uL (ref 4.22–5.81)
RDW: 12.3 % (ref 11.5–15.5)
WBC: 4.8 10*3/uL (ref 4.0–10.5)
nRBC: 0 % (ref 0.0–0.2)

## 2023-08-27 MED ORDER — LIDOCAINE VISCOUS HCL 2 % MT SOLN: 15.0000 mL | Freq: Four times a day (QID) | OROMUCOSAL | 0 refills | Status: AC | PRN

## 2023-08-27 MED ORDER — ALUM & MAG HYDROXIDE-SIMETH 200-200-20 MG/5ML PO SUSP
30.0000 mL | Freq: Once | ORAL | Status: AC
  Administered 2023-08-27: 30 mL via ORAL
  Filled 2023-08-27: qty 30

## 2023-08-27 MED ORDER — LIDOCAINE VISCOUS HCL 2 % MT SOLN
15.0000 mL | Freq: Once | OROMUCOSAL | Status: AC
  Administered 2023-08-27: 15 mL via ORAL
  Filled 2023-08-27: qty 15

## 2023-08-27 NOTE — Telephone Encounter (Signed)
Left message on voicemail.

## 2023-08-27 NOTE — ED Triage Notes (Signed)
Pt via POV from home. Pt c/o mid-sternal chest pain. Report a stinging type pain. Also report nasal drainage and sore throat for the past couple of weeks. Pt has a hx of GERD and states this feels similar. Pt is A&Ox4 and NAD, ambulatory to triage.

## 2023-08-27 NOTE — ED Provider Notes (Signed)
[facility] Provider Note    Event Date/Time   First MD Initiated Contact with Patient 08/27/23 1202     (approximate)   History   Chest Pain   HPI  Steven Miranda is a 77 y.o. male with no history of CAD but does have a history of diabetes and GERD including Barrett's esophagus who presents with complaints of chest discomfort, he reports this has been going on for more than a week, and is intermittent, it is not exertional.  No fevers chills or cough.  No shortness of breath.  No calf pain or swelling.  He suspects it is worsening of his GERD, it is worse at night when he is laying down     Physical Exam   Triage Vital Signs: ED Triage Vitals  Encounter Vitals Group     BP 08/27/23 1102 (!) 155/89     Systolic BP Percentile --      Diastolic BP Percentile --      Pulse Rate 08/27/23 1102 66     Resp 08/27/23 1102 18     Temp 08/27/23 1102 98.7 F (37.1 C)     Temp Source 08/27/23 1102 Oral     SpO2 08/27/23 1102 95 %     Weight 08/27/23 1101 109.3 kg (241 lb)     Height 08/27/23 1101 1.88 m (6\' 2" )     Head Circumference --      Peak Flow --      Pain Score 08/27/23 1101 4     Pain Loc --      Pain Education --      Exclude from Growth Chart --     Most recent vital signs: Vitals:   08/27/23 1102  BP: (!) 155/89  Pulse: 66  Resp: 18  Temp: 98.7 F (37.1 C)  SpO2: 95%     General: Awake, no distress.  CV:  Good peripheral perfusion.  Resp:  Normal effort.  Abd:  No distention.  Soft, nontender Other:     ED Results / Procedures / Treatments   Labs (all labs ordered are listed, but only abnormal results are displayed) Labs Reviewed  BASIC METABOLIC PANEL - Abnormal; Notable for the following components:      Result Value   Glucose, Bld 146 (*)    All other components within normal limits  CBC  TROPONIN I (HIGH SENSITIVITY)  TROPONIN I (HIGH SENSITIVITY)     EKG  ED ECG REPORT I, Jene Every, the attending  physician, personally viewed and interpreted this ECG.  Date: 08/27/2023  Rhythm: normal sinus rhythm QRS Axis: normal Intervals: normal ST/T Wave abnormalities: normal Narrative Interpretation: no evidence of acute ischemia    RADIOLOGY Chest x-ray viewed interpret by me, no acute abnormality    PROCEDURES:  Critical Care performed:   Procedures   MEDICATIONS ORDERED IN ED: Medications  alum & mag hydroxide-simeth (MAALOX/MYLANTA) 200-200-20 MG/5ML suspension 30 mL (30 mLs Oral Given 08/27/23 1236)    And  lidocaine (XYLOCAINE) 2 % viscous mouth solution 15 mL (15 mLs Oral Given 08/27/23 1236)     IMPRESSION / MDM / ASSESSMENT AND PLAN / ED COURSE  I reviewed the triage vital signs and the nursing notes. Patient's presentation is most consistent with acute presentation with potential threat to life or bodily function.  Patient presents with chest pain as detailed above, ongoing symptoms over the last week or 2, does not appear to be exertional.  Doubt ACS  or angina although these are on the differential, not consistent with myocarditis, GERD is a possibility.  Doubt pneumonia  EKG, high sensitive troponin are reassuring, lab work otherwise unremarkable, chest x-ray is without acute abnormality.  Delta troponin is normal.  Patient treated with GI cocktail with significant improvement.  He reports symptoms have nearly resolved, we discussed admission however he would strongly prefer discharge with outpatient follow-up, he reports his PCP has arranged for cardiology follow-up, he knows he can return to the emergency department at any time.          FINAL CLINICAL IMPRESSION(S) / ED DIAGNOSES   Final diagnoses:  Atypical chest pain     Rx / DC Orders   ED Discharge Orders          Ordered    lidocaine (XYLOCAINE) 2 % solution  Every 6 hours PRN        08/27/23 1355             Note:  This document was prepared using Dragon voice recognition software  and may include unintentional dictation errors.   Jene Every, MD 08/27/23 [phone]

## 2023-09-03 ENCOUNTER — Other Ambulatory Visit: Payer: Self-pay

## 2023-09-03 NOTE — Progress Notes (Unsigned)
Celso Amy, PA-C [address]  [address]  [place], [place] [address]  Main: [phone]  Fax: [phone]   Primary Care Physician: Bosie Clos, MD  Primary Gastroenterologist:  Celso Amy, PA-C / Dr. Midge Minium    CC:  F/U GERD, Barrett's esophagus  HPI: Steven Miranda is a 77 y.o. male, established patient Dr. Servando Snare, presents for flareup of acid reflux which occurred 2 weeks ago.  He had burning in his mid lower chest which lasted a few days.  Was worse at nighttime.  Nonexertional.  Started after he drank alcohol, beer.  He saw his PCP Dr. Sullivan Lone who added famotidine 40 Mg nightly.  He was already taking pantoprazole 40 mg before lunch and Carafate 4 times daily as needed.  Since he was started on famotidine before bed, his acid reflux and heartburn has resolved.  He denies dysphagia, weight loss, abdominal pain, or any alarm symptoms.  12/31/2022 EGD: Short segment Barrett's, normal stomach and duodenum.  No dysplasia.  3-year repeat.  12/31/2022 colonoscopy: 3 small polyps removed (1 tubular adenoma, 2 hyperplastic).  Excellent prep.  3-year repeat.  Current Outpatient Medications  Medication Sig Dispense Refill   ACCU-CHEK SMARTVIEW test strip CHECK  FASTING  BLOOD  SUGAR EVERY MORNING AS INSTRUCTED 100 each 4   atorvastatin (LIPITOR) 20 MG tablet Take 1 tablet (20 mg total) by mouth daily. 90 tablet 3   Blood Glucose Monitoring Suppl (ACCU-CHEK NANO SMARTVIEW) w/Device KIT Use glucometer (Accu-Chek Nano Glucometer) to test fasting glucose daily. 1 kit 0   Dapagliflozin-sAXagliptin (QTERN) 10-5 MG TABS Take 1 tablet by mouth daily. 90 tablet 3   diltiazem (CARDIZEM CD) 120 MG 24 hr capsule Take 1 capsule (120 mg total) by mouth daily. 90 capsule 3   empagliflozin (JARDIANCE) 25 MG TABS tablet Take 1 tablet (25 mg total) by mouth daily before breakfast. 90 tablet 1   erythromycin ophthalmic ointment Apply to sutures 4 times a day for 10-12 days.  Discontinue if  allergy develops and call our office 3.5 g 2   Ferrous Sulfate (IRON SUPPLEMENT PO) Take 1 tablet by mouth daily.     fluticasone (FLONASE) 50 MCG/ACT nasal spray Place into both nostrils as needed.     gabapentin (NEURONTIN) 300 MG capsule Take 300 mg by mouth 3 (three) times daily.     glucose blood (TRUE METRIX BLOOD GLUCOSE TEST) test strip Use as instructed 100 each 12   hyoscyamine (LEVSIN SL) 0.125 MG SL tablet Place 0.125 mg under the tongue 2 (two) times daily.     Lancets (ONETOUCH ULTRASOFT) lancets Use as instructed once a day for fasting glucose check. 100 each 3   losartan (COZAAR) 50 MG tablet Take 1 tablet by mouth daily.     meloxicam (MOBIC) 15 MG tablet Take 15 mg by mouth daily.     metFORMIN (GLUCOPHAGE) 1000 MG tablet Take 1 tablet (1,000 mg total) by mouth 2 (two) times daily with a meal. 180 tablet 0   Misc Natural Products (OSTEO BI-FLEX ADV DOUBLE ST) CAPS Take 1 capsule by mouth daily.     pioglitazone (ACTOS) 15 MG tablet Take 1 tablet (15 mg total) by mouth daily. 90 tablet 0   Probiotic Product (PROBIOTIC FORMULA) CAPS Take by mouth daily.     sucralfate (CARAFATE) 1 g tablet Take 1 tablet (1 g total) by mouth 4 (four) times daily -  with meals and at bedtime. 360 tablet 2   traMADol (  ULTRAM) 50 MG tablet Take 1 every 4-6 hours as needed for pain not controlled by Tylenol 6 tablet 0   famotidine (PEPCID) 40 MG tablet Take 1 tablet (40 mg total) by mouth at bedtime. 90 tablet 3   pantoprazole (PROTONIX) 40 MG tablet Take 1 tablet (40 mg total) by mouth daily. 90 tablet 3   No current facility-administered medications for this visit.    Allergies as of 09/04/2023   (No Known Allergies)    Past Medical History:  Diagnosis Date   Actinic keratosis    Arthritis    right thumb   Broken wrist    Diabetes mellitus without complication (HCC)    Dysuria    GERD (gastroesophageal reflux disease)    Hyperlipidemia    Hypertension    Wears contact lenses      Past Surgical History:  Procedure Laterality Date   BROW LIFT Right 06/14/2022   Procedure: BLEPHAROPTOSIS REPAIR; RESECT EX RIGHT DIABETIC;  Surgeon: Imagene Riches, MD;  Location: [facility];  Service: Ophthalmology;  Laterality: Right;   COLONOSCOPY  2005   COLONOSCOPY WITH PROPOFOL N/A 10/16/2016   Procedure: COLONOSCOPY WITH PROPOFOL;  Surgeon: Earline Mayotte, MD;  Location: [facility] END[facility]COPY;  Service: Endoscopy;  Laterality: N/A;   COLONOSCOPY WITH PROPOFOL N/A 12/25/2021   Procedure: COLONOSCOPY WITH PROPOFOL;  Surgeon: Midge Minium, MD;  Location: [facility] ENDOSCOPY;  Service: Endoscopy;  Laterality: N/A;   COLONOSCOPY WITH PROPOFOL N/A 12/31/2022   Procedure: COLONOSCOPY WITH PROPOFOL;  Surgeon: Midge Minium, MD;  Location: [facility] ENDOSCOPY;  Service: Endoscopy;  Laterality: N/A;   ESOPHAGOGASTRODUODENOSCOPY N/A 12/31/2022   Procedure: ESOPHAGOGASTRODUODENOSCOPY (EGD);  Surgeon: Midge Minium, MD;  Location: [facility] ENDOSCOPY;  Service: Endoscopy;  Laterality: N/A;   ESOPHAGOGASTRODUODENOSCOPY (EGD) WITH PROPOFOL N/A 12/13/2019   Procedure: ESOPHAGOGASTRODUODENOSCOPY (EGD) WITH BIOPSY;  Surgeon: Midge Minium, MD;  Location: [facility];  Service: Endoscopy;  Laterality: N/A;  Diabetic - oral meds   PLANTAR'S WART EXCISION     STRABISMUS SURGERY Right     Review of Systems:    All systems reviewed and negative except where noted in HPI.   Physical Examination:   BP 131/80 (BP Location: Left Arm, Patient Position: Sitting, Cuff Size: Normal)   Pulse 76   Temp 97.7 F (36.5 C) (Oral)   Ht 6\' 2"  (1.88 m)   Wt 242 lb 8 oz (110 kg)   BMI 31.14 kg/m   General: Well-nourished, well-developed in no acute distress.  Lungs: Clear to auscultation bilaterally. Non-labored. Heart: Regular rate and rhythm, no murmurs rubs or gallops.  Abdomen: Bowel sounds are normal; Abdomen is Soft; No hepatosplenomegaly, masses or hernias;  No Abdominal Tenderness; No guarding or rebound  tenderness. Neuro: Alert and oriented x 3.  Grossly intact.  Psych: Alert and cooperative, normal mood and affect.   Imaging Studies: DG Chest 2 View  Result Date: 08/27/2023 CLINICAL DATA:  Chest pain. EXAM: CHEST - 2 VIEW COMPARISON:  March 24, 2022. FINDINGS: The heart size and mediastinal contours are within normal limits. Both lungs are clear. The visualized skeletal structures are unremarkable. IMPRESSION: No active cardiopulmonary disease. Electronically Signed   By: Lupita Raider M.D.   On: 08/27/2023 12:46    Assessment and Plan:   BREVAN Miranda is a 77 y.o. y/o male presents for flareup of acid reflux which occurred 2 weeks ago, attributed to dietary indiscretions.  Recently started on famotidine in addition to pantoprazole by his PCP and his symptoms  have currently resolved.  1.  GERD -controlled on PPI and H2 RB  Reassurance.  Patient education discussed.  Continue pantoprazole 40 Mg daily, #90, 3RF  Continue Famotidine 40mg  daily at bedtime, #90, 3 RF.  Continue Sucralafate 1g QID prn.  Recommend Lifestyle Modifications to prevent Acid Reflux.  Rec. Avoid coffee, sodas, alcohol, carbonated drinks, peppermint, onions, garlic, fatty and greasy foods, citrus fruits, and spicey foods.  Avoid eating 2-3 hours before bedtime.  Recommend elevate the head of his bed at night.  2.  Barrett's esophagus -EGD 12/2022 showed no dysplasia.  Plan for 3-year repeat EGD 12/2025.  3.  History of adenomatous colon polyps  Plan for 3-year repeat colonoscopy 12/2025.  Celso Amy, PA-C  Follow up As needed if recurrent or worsening GI symptoms.

## 2023-09-04 ENCOUNTER — Encounter: Payer: Self-pay | Admitting: Physician Assistant

## 2023-09-04 ENCOUNTER — Ambulatory Visit: Payer: Medicare HMO | Admitting: Physician Assistant

## 2023-09-04 VITALS — BP 131/80 | HR 76 | Temp 97.7°F | Ht 74.0 in | Wt 242.5 lb

## 2023-09-04 DIAGNOSIS — K219 Gastro-esophageal reflux disease without esophagitis: Secondary | ICD-10-CM

## 2023-09-04 DIAGNOSIS — Z860101 Personal history of adenomatous and serrated colon polyps: Secondary | ICD-10-CM | POA: Diagnosis not present

## 2023-09-04 DIAGNOSIS — K2270 Barrett's esophagus without dysplasia: Secondary | ICD-10-CM

## 2023-09-04 MED ORDER — FAMOTIDINE 40 MG PO TABS: 40.0000 mg | ORAL_TABLET | Freq: Every day | ORAL | 3 refills | Status: AC

## 2023-09-04 MED ORDER — PANTOPRAZOLE SODIUM 40 MG PO TBEC: 40.0000 mg | DELAYED_RELEASE_TABLET | Freq: Every day | ORAL | 3 refills | Status: AC

## 2023-09-08 ENCOUNTER — Other Ambulatory Visit: Payer: Self-pay | Admitting: Cardiology

## 2023-09-08 DIAGNOSIS — I10 Essential (primary) hypertension: Secondary | ICD-10-CM | POA: Diagnosis not present

## 2023-09-08 DIAGNOSIS — K2270 Barrett's esophagus without dysplasia: Secondary | ICD-10-CM | POA: Diagnosis not present

## 2023-09-08 DIAGNOSIS — E1100 Type 2 diabetes mellitus with hyperosmolarity without nonketotic hyperglycemic-hyperosmolar coma (NKHHC): Secondary | ICD-10-CM | POA: Diagnosis not present

## 2023-09-08 DIAGNOSIS — R0789 Other chest pain: Secondary | ICD-10-CM

## 2023-09-08 DIAGNOSIS — E782 Mixed hyperlipidemia: Secondary | ICD-10-CM | POA: Diagnosis not present

## 2023-09-09 ENCOUNTER — Encounter (HOSPITAL_COMMUNITY): Payer: Self-pay

## 2023-09-09 ENCOUNTER — Telehealth (HOSPITAL_COMMUNITY): Payer: Self-pay | Admitting: *Deleted

## 2023-09-09 MED ORDER — METOPROLOL TARTRATE 25 MG PO TABS: ORAL_TABLET | ORAL | 0 refills | Status: DC

## 2023-09-09 NOTE — Telephone Encounter (Signed)
Reaching out to patient to offer assistance regarding upcoming cardiac imaging study; pt verbalizes understanding of appt date/time, parking situation and where to check in, pre-test NPO status and medications ordered, and verified current allergies; name and call back number provided for further questions should they arise  Larey Brick RN Navigator Cardiac Imaging [facility] [phone] office [phone] cell  Patient to take 25mg  metoprolol tartrate two hours prior to his cardiac CT scan.

## 2023-09-11 ENCOUNTER — Ambulatory Visit
Admission: RE | Admit: 2023-09-11 | Discharge: 2023-09-11 | Disposition: A | Discharge status: Home / Self Care | Payer: Medicare HMO | Source: Ambulatory Visit | Attending: Cardiology | Admitting: Cardiology

## 2023-09-11 DIAGNOSIS — R072 Precordial pain: Secondary | ICD-10-CM

## 2023-09-11 DIAGNOSIS — R0789 Other chest pain: Secondary | ICD-10-CM | POA: Insufficient documentation

## 2023-09-11 MED ORDER — DILTIAZEM HCL 25 MG/5ML IV SOLN: 10.0000 mg | INTRAVENOUS | Status: DC | PRN

## 2023-09-11 MED ORDER — NITROGLYCERIN 0.4 MG SL SUBL
0.8000 mg | SUBLINGUAL_TABLET | Freq: Once | SUBLINGUAL | Status: AC
  Administered 2023-09-11: 0.8 mg via SUBLINGUAL

## 2023-09-11 MED ORDER — METOPROLOL TARTRATE 5 MG/5ML IV SOLN
10.0000 mg | Freq: Once | INTRAVENOUS | Status: AC | PRN
  Administered 2023-09-11: 10 mg via INTRAVENOUS

## 2023-09-11 MED ORDER — IOHEXOL 350 MG/ML SOLN
100.0000 mL | Freq: Once | INTRAVENOUS | Status: AC | PRN
  Administered 2023-09-11: 100 mL via INTRAVENOUS

## 2023-09-11 MED ORDER — IOHEXOL 300 MG/ML  SOLN: 100.0000 mL | Freq: Once | INTRAMUSCULAR | Status: DC | PRN

## 2023-09-11 MED ORDER — SODIUM CHLORIDE 0.9 % IV BOLUS
150.0000 mL | Freq: Once | INTRAVENOUS | Status: AC
  Administered 2023-09-11: 150 mL via INTRAVENOUS

## 2023-09-11 NOTE — Progress Notes (Signed)
Patient tolerated procedure well. Ambulate w/o difficulty. Denies light headedness or being dizzy. Sitting up drinking water provided. Encouraged to drink extra water today and reasoning explained. Verbalized understanding. All questions answered. ABC intact. No further needs. Discharge from procedure area w/o issues.

## 2023-09-15 DIAGNOSIS — M1711 Unilateral primary osteoarthritis, right knee: Secondary | ICD-10-CM | POA: Diagnosis not present

## 2023-09-15 DIAGNOSIS — M5416 Radiculopathy, lumbar region: Secondary | ICD-10-CM | POA: Diagnosis not present

## 2023-10-02 ENCOUNTER — Other Ambulatory Visit: Payer: Self-pay | Admitting: Gastroenterology

## 2023-10-02 DIAGNOSIS — R0789 Other chest pain: Secondary | ICD-10-CM

## 2023-10-02 DIAGNOSIS — K30 Functional dyspepsia: Secondary | ICD-10-CM

## 2023-10-08 DIAGNOSIS — R0789 Other chest pain: Secondary | ICD-10-CM | POA: Diagnosis not present

## 2023-10-08 DIAGNOSIS — E782 Mixed hyperlipidemia: Secondary | ICD-10-CM | POA: Diagnosis not present

## 2023-10-08 DIAGNOSIS — I10 Essential (primary) hypertension: Secondary | ICD-10-CM | POA: Diagnosis not present

## 2023-10-08 DIAGNOSIS — I480 Paroxysmal atrial fibrillation: Secondary | ICD-10-CM | POA: Diagnosis not present

## 2023-10-08 DIAGNOSIS — K2270 Barrett's esophagus without dysplasia: Secondary | ICD-10-CM | POA: Diagnosis not present

## 2023-11-28 ENCOUNTER — Telehealth: Payer: Self-pay | Admitting: Cardiology

## 2023-11-28 NOTE — Telephone Encounter (Signed)
 Pt said he is being seen at [facility].

## 2023-12-02 DIAGNOSIS — K2270 Barrett's esophagus without dysplasia: Secondary | ICD-10-CM | POA: Diagnosis not present

## 2023-12-02 DIAGNOSIS — E119 Type 2 diabetes mellitus without complications: Secondary | ICD-10-CM | POA: Diagnosis not present

## 2023-12-02 DIAGNOSIS — Z860100 Personal history of colon polyps, unspecified: Secondary | ICD-10-CM | POA: Diagnosis not present

## 2023-12-02 DIAGNOSIS — E7800 Pure hypercholesterolemia, unspecified: Secondary | ICD-10-CM | POA: Diagnosis not present

## 2023-12-02 DIAGNOSIS — I10 Essential (primary) hypertension: Secondary | ICD-10-CM | POA: Diagnosis not present

## 2023-12-09 DIAGNOSIS — Z0100 Encounter for examination of eyes and vision without abnormal findings: Secondary | ICD-10-CM | POA: Diagnosis not present

## 2024-02-17 DIAGNOSIS — H40013 Open angle with borderline findings, low risk, bilateral: Secondary | ICD-10-CM | POA: Diagnosis not present

## 2024-03-09 ENCOUNTER — Ambulatory Visit: Payer: Medicare HMO | Admitting: Dermatology

## 2024-03-09 DIAGNOSIS — D229 Melanocytic nevi, unspecified: Secondary | ICD-10-CM

## 2024-03-09 DIAGNOSIS — L578 Other skin changes due to chronic exposure to nonionizing radiation: Secondary | ICD-10-CM | POA: Diagnosis not present

## 2024-03-09 DIAGNOSIS — L821 Other seborrheic keratosis: Secondary | ICD-10-CM | POA: Diagnosis not present

## 2024-03-09 DIAGNOSIS — L570 Actinic keratosis: Secondary | ICD-10-CM

## 2024-03-09 DIAGNOSIS — D225 Melanocytic nevi of trunk: Secondary | ICD-10-CM | POA: Diagnosis not present

## 2024-03-09 DIAGNOSIS — D1801 Hemangioma of skin and subcutaneous tissue: Secondary | ICD-10-CM

## 2024-03-09 DIAGNOSIS — Z872 Personal history of diseases of the skin and subcutaneous tissue: Secondary | ICD-10-CM | POA: Diagnosis not present

## 2024-03-09 DIAGNOSIS — W908XXA Exposure to other nonionizing radiation, initial encounter: Secondary | ICD-10-CM | POA: Diagnosis not present

## 2024-03-09 DIAGNOSIS — L814 Other melanin hyperpigmentation: Secondary | ICD-10-CM

## 2024-03-09 DIAGNOSIS — Z1283 Encounter for screening for malignant neoplasm of skin: Secondary | ICD-10-CM | POA: Diagnosis not present

## 2024-03-09 DIAGNOSIS — Q825 Congenital non-neoplastic nevus: Secondary | ICD-10-CM

## 2024-03-09 NOTE — Progress Notes (Signed)
 Follow-Up Visit   Subjective  Steven Miranda is a 78 y.o. male who presents for the following: Skin Cancer Screening and Upper Body Skin Exam  The patient presents for Upper Body Skin Exam (UBSE) for skin cancer screening and mole check. The patient has spots, moles and lesions to be evaluated, some may be new or changing. History of AKs. No history of skin cancer.  He has a spot on the left temple, not bothersome.    The following portions of the chart were reviewed this encounter and updated as appropriate: medications, allergies, medical history  Review of Systems:  No other skin or systemic complaints except as noted in HPI or Assessment and Plan.  Objective  Well appearing patient in no apparent distress; mood and affect are within normal limits.  All skin waist up examined. Relevant physical exam findings are noted in the Assessment and Plan.  L antihelix x 3, L forehead x 2, L temple x 1, L malar cheek x 2, R antihelix x 1 (9) Pink scaly macules.   Assessment & Plan   AK (ACTINIC KERATOSIS) (9) L antihelix x 3, L forehead x 2, L temple x 1, L malar cheek x 2, R antihelix x 1 (9) Actinic keratoses are precancerous spots that appear secondary to cumulative UV radiation exposure/sun exposure over time. They are chronic with expected duration over 1 year. A portion of actinic keratoses will progress to squamous cell carcinoma of the skin. It is not possible to reliably predict which spots will progress to skin cancer and so treatment is recommended to prevent development of skin cancer.  Recommend daily broad spectrum sunscreen SPF 30+ to sun-exposed areas, reapply every 2 hours as needed.  Recommend staying in the shade or wearing long sleeves, sun glasses (UVA+UVB protection) and wide brim hats (4-inch brim around the entire circumference of the hat). Call for new or changing lesions. Destruction of lesion - L antihelix x 3, L forehead x 2, L temple x 1, L malar cheek x 2, R  antihelix x 1 (9)  Destruction method: cryotherapy   Informed consent: discussed and consent obtained   Lesion destroyed using liquid nitrogen: Yes   Region frozen until ice ball extended beyond lesion: Yes   Outcome: patient tolerated procedure well with no complications   Post-procedure details: wound care instructions given   Additional details:  Prior to procedure, discussed risks of blister formation, small wound, skin dyspigmentation, or rare scar following cryotherapy. Recommend Vaseline ointment to treated areas while healing.  Skin cancer screening performed today.  Actinic Damage - Chronic condition, secondary to cumulative UV/sun exposure - diffuse scaly erythematous macules with underlying dyspigmentation - Recommend daily broad spectrum sunscreen SPF 30+ to sun-exposed areas, reapply every 2 hours as needed.  - Staying in the shade or wearing long sleeves, sun glasses (UVA+UVB protection) and wide brim hats (4-inch brim around the entire circumference of the hat) are also recommended for sun protection.  - Call for new or changing lesions.  Lentigines, Seborrheic Keratoses (including left temple), Hemangiomas - Benign normal skin lesions - Benign-appearing - Call for any changes  Melanocytic Nevi - Tan-brown and/or pink-flesh-colored symmetric macules and papules - 0.5 cm medium brown macule, darker sup edge at left lower back - Benign appearing on exam today, Stable. - Observation - Call clinic for new or changing moles - Recommend daily use of broad spectrum spf 30+ sunscreen to sun-exposed areas.   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  Stable. Observation.  Call clinic for new or changing lesions.  Recommend daily use of broad spectrum spf 30+ sunscreen to sun-exposed areas.   Return in about 1 year (around 03/09/2025) for UBSE, Hx AKs.  IBernardine Bridegroom, CMA, am acting as  scribe for Artemio Larry, MD .   Documentation: I have reviewed the above documentation for accuracy and completeness, and I agree with the above.  Artemio Larry, MD

## 2024-03-09 NOTE — Patient Instructions (Addendum)

## 2024-04-01 ENCOUNTER — Ambulatory Visit
Admission: RE | Admit: 2024-04-01 | Discharge: 2024-04-01 | Disposition: A | Discharge status: Home / Self Care | Source: Ambulatory Visit | Attending: Family Medicine | Admitting: Family Medicine

## 2024-04-01 ENCOUNTER — Other Ambulatory Visit: Payer: Self-pay | Admitting: Family Medicine

## 2024-04-01 DIAGNOSIS — E7800 Pure hypercholesterolemia, unspecified: Secondary | ICD-10-CM | POA: Diagnosis not present

## 2024-04-01 DIAGNOSIS — R1013 Epigastric pain: Secondary | ICD-10-CM | POA: Insufficient documentation

## 2024-04-01 DIAGNOSIS — I10 Essential (primary) hypertension: Secondary | ICD-10-CM | POA: Diagnosis not present

## 2024-04-01 DIAGNOSIS — N281 Cyst of kidney, acquired: Secondary | ICD-10-CM | POA: Diagnosis not present

## 2024-04-01 DIAGNOSIS — K760 Fatty (change of) liver, not elsewhere classified: Secondary | ICD-10-CM | POA: Diagnosis not present

## 2024-04-01 DIAGNOSIS — K2270 Barrett's esophagus without dysplasia: Secondary | ICD-10-CM | POA: Diagnosis not present

## 2024-04-01 DIAGNOSIS — E119 Type 2 diabetes mellitus without complications: Secondary | ICD-10-CM | POA: Diagnosis not present

## 2024-04-01 NOTE — Progress Notes (Signed)
 [facility] - [facility]  Chief complaint: Abdominal Pain (X 3 Days)   Subjective: Steven Miranda is a 78 y.o. male here for acute visit. History of Present Illness Steven Miranda is a 78 year old male with Barrett's esophagus and DM  who presents with upper abdominal and back pain.  He has experienced burning pain across his upper abdomen radiating to his back since Sunday, four days ago. The pain has been consistent with fluctuations in intensity, particularly severe on Monday night and Wednesday around lunchtime. Lidocaine  provided some relief after about 30 minutes, but the discomfort persists.  He associates the onset of the pain with eating peanuts on Sunday night, although he is unsure if this is related. He is currently taking famotidine  and pantoprazole  for Barrett's esophagus but believes the current pain is different from his usual symptoms, as it is located lower in the abdomen. No shortness of breath, sweating, or exacerbation of pain with movement, although there is slight improvement when standing. His acid reflux medication has not alleviated the current pain. He is concerned about the pain as he is planning to go on vacation in a week.  His social history includes a history of smoking for 46 years and occasional alcohol consumption. He has a history of a cardiac evaluation last year and is scheduled for a follow-up with cardiology next week.    Current Outpatient Medications:  .  atorvastatin  (LIPITOR) 20 MG tablet, Take 1 tablet (20 mg total) by mouth once daily, Disp: 90 tablet, Rfl: 3 .  blood glucose diagnostic (GLUCOSE BLOOD) test strip, 1 each (1 strip total) 3 (three) times daily True Metrix Test Strips, Disp: 100 each, Rfl: 10 .  CARDIZEM  CD 120 mg XR capsule, Take 120 mg by mouth once daily, Disp: , Rfl:  .  dilTIAZem  (CARDIZEM  CD) 120 MG XR capsule, Take 1 capsule (120 mg total) by mouth once daily (Patient taking differently: Take 120 mg by mouth once daily DUPLICATE),  Disp: 90 capsule, Rfl: 3 .  hyoscyamine  (LEVSIN /SL) 0.125 mg SL tablet, Take 0.125 mg by mouth every 6 (six) hours as needed, Disp: , Rfl:  .  JARDIANCE  25 mg tablet, TAKE 1 TABLET EVERY MORNING BEFORE BREAKFAST, Disp: 90 tablet, Rfl: 3 .  Lacto.acidophilus-Bif.animalis 32 billion cell Cap, Take by mouth, Disp: , Rfl:  .  LIDOCAINE  2 % solution, RINSE AND GARGLE 15 ML BY MOUTH OR THROAT EVERY 6 HOURS FOR UP TO 7 DAYS AS DIRECTED AS NEEDED FOR MOUTH PAIN, Disp: 100 mL, Rfl: 2 .  losartan (COZAAR) 50 MG tablet, Take 1 tablet (50 mg total) by mouth once daily, Disp: 90 tablet, Rfl: 3 .  metFORMIN  (GLUCOPHAGE ) 1000 MG tablet, Take 1 tablet (1,000 mg total) by mouth 2 (two) times daily with meals, Disp: 180 tablet, Rfl: 3 .  pantoprazole  (PROTONIX ) 40 MG DR tablet, Take 1 tablet (40 mg total) by mouth once daily, Disp: 90 tablet, Rfl: 3 .  pioglitazone  (ACTOS ) 15 MG tablet, Take 15 mg by mouth once daily, Disp: , Rfl:  .  sucralfate  (CARAFATE ) 1 gram tablet, Take 1 tablet (1 g total) by mouth 4 (four) times daily before meals and nightly, Disp: 120 tablet, Rfl: 2 .  famotidine  (PEPCID ) 40 MG tablet, Take 1 tablet (40 mg total) by mouth at bedtime (Patient not taking: Reported on 04/01/2024), Disp: 30 tablet, Rfl: 1  ROS reviewed: General ROS:  Negative for  - fatigue, fevers, chills HEENT ROS: negative for seasonal allergies, congestion, cough  Respiratory ROS: negative for - cough, SOB, wheezing Cardiovascular ROS: no chest pain or dyspnea on exertion Gastrointestinal ROS: negative for constipation, N/V,D Genito-Urinary ROS: no dysuria, trouble voiding, or hematuria Musculoskeletal ROS: negative for joint pain, swelling Neurological ROS: negative for headaches, dizzinesss Dermatological ROS: negative for rash or skin lesion    Psychological ROS: negative for depression or anxiety  No Known Allergies  Social History   Tobacco Use  . Smoking status: Former    Types: Cigarettes  . Smokeless  tobacco: Never  Substance Use Topics  . Alcohol use: Yes  . Drug use: Never      Objective:  BP 112/78 (BP Location: Left upper arm, Patient Position: Sitting, BP Cuff Size: Adult)   Pulse 68   Ht 188 cm (6' 2)   Wt (!) 109.8 kg (242 lb)   SpO2 98%   BMI 31.07 kg/m  reviewed. Gen: AAOx3. Well-developed and well-nourished. NAD.  HEENT:    HEAD NORMOCEPHALIC.  PERRLA, EOM intact.  No thyromegaly present. No lymphadpathy. Cardiovascular: Normal rate, regular rhythm. Normal S1 and S2 without murmus, rubs or gallops.  Pulmonary/Chest: CTAB. Effort normal and breath sounds normal. No respiratory distress. No wheezes or rales. Abdomen: Soft. Bowel sounds are normal. No distension or tenderness.  Neuro: Cranial nervess II-XII intact.Nonfocal exam. Skin: Skin is warm and dry.  Musculoskeletal: Normal range of motion. No edema, no tenderness.  Psychiatric: Normal mood and affect. Her behavior is normal. Judgment and thought content normal  Assessment and Plan: Assessment & Plan Acute epigastric pain  (primary encounter diagnosis) Plan: US  abdomen complete, ECG 12-lead, CBC w/auto        Differential (3 Part), Comprehensive Metabolic        Panel (CMP), Lipase  Type 2 diabetes mellitus without complication, without long-term current use of insulin (CMS/HHS-HCC) Plan: Hemoglobin A1C  Barrett's esophagus without dysplasia Plan: Comprehensive Metabolic Panel (CMP)  Essential (primary) hypertension Plan: Comprehensive Metabolic Panel (CMP), Urinalysis       w/Microscopic  Pure hypercholesterolemia Plan: Comprehensive Metabolic Panel (CMP), Lipid        Panel w/calc LDL, Thyroid  Stimulating-Hormone        (TSH)  Functional dyspepsia    Abdominal Pain Burning pain from upper abdomen to back, rated 7-8/10. Differential includes aortic aneurysm, pancreatitis, gallbladder issues. Aortic aneurysm most concerning due to pain nature and location. Pancreatitis considered due to alcohol  use. Gallbladder issues less likely. - Order EKG to rule out cardiac causes. - Order stat abdominal ultrasound for aortic aneurysm and pancreatitis. - Perform lab work for pancreatitis and other causes. - Instruct to avoid alcohol until follow-up on Tuesday. - Increase pantoprazole  to twice daily until follow-up on Tuesday.  Barrett's Esophagus Current pain lower than typical Barrett's symptoms, unlikely related due to location and lack of response to acid reflux medication. - Continue current medications for Barrett's Esophagus. - Increase pantoprazole  to twice daily until follow-up on Tuesday.  Follow-up Scheduled for follow-up on Tuesday to review ultrasound and lab results. - Follow up with cardiology appointment on Tuesday at 9 AM. - Follow up with primary care appointment on Tuesday at 10 AM. - Ensure lab work is completed before the follow-up appointment.   Hydrated,no alcohol for now     This note has been created using automated tools and reviewed for accuracy by RICHARD LESLIE GILBERT.

## 2024-04-06 DIAGNOSIS — I10 Essential (primary) hypertension: Secondary | ICD-10-CM | POA: Diagnosis not present

## 2024-04-06 DIAGNOSIS — I480 Paroxysmal atrial fibrillation: Secondary | ICD-10-CM | POA: Diagnosis not present

## 2024-04-06 DIAGNOSIS — E1100 Type 2 diabetes mellitus with hyperosmolarity without nonketotic hyperglycemic-hyperosmolar coma (NKHHC): Secondary | ICD-10-CM | POA: Diagnosis not present

## 2024-04-06 DIAGNOSIS — E782 Mixed hyperlipidemia: Secondary | ICD-10-CM | POA: Diagnosis not present

## 2024-04-06 DIAGNOSIS — Z1331 Encounter for screening for depression: Secondary | ICD-10-CM | POA: Diagnosis not present

## 2024-04-06 DIAGNOSIS — E119 Type 2 diabetes mellitus without complications: Secondary | ICD-10-CM | POA: Diagnosis not present

## 2024-04-06 DIAGNOSIS — Z0000 Encounter for general adult medical examination without abnormal findings: Secondary | ICD-10-CM | POA: Diagnosis not present

## 2024-04-06 DIAGNOSIS — N281 Cyst of kidney, acquired: Secondary | ICD-10-CM | POA: Diagnosis not present

## 2024-04-06 DIAGNOSIS — R1013 Epigastric pain: Secondary | ICD-10-CM | POA: Diagnosis not present

## 2024-04-06 DIAGNOSIS — K2270 Barrett's esophagus without dysplasia: Secondary | ICD-10-CM | POA: Diagnosis not present

## 2024-05-10 ENCOUNTER — Other Ambulatory Visit: Payer: Self-pay | Admitting: Family Medicine

## 2024-05-10 DIAGNOSIS — N281 Cyst of kidney, acquired: Secondary | ICD-10-CM

## 2024-05-10 DIAGNOSIS — N2889 Other specified disorders of kidney and ureter: Secondary | ICD-10-CM

## 2024-05-12 ENCOUNTER — Other Ambulatory Visit: Payer: Self-pay | Admitting: Gastroenterology

## 2024-05-12 ENCOUNTER — Encounter: Payer: Self-pay | Admitting: Family Medicine

## 2024-05-12 DIAGNOSIS — R0789 Other chest pain: Secondary | ICD-10-CM

## 2024-05-12 DIAGNOSIS — K30 Functional dyspepsia: Secondary | ICD-10-CM

## 2024-05-18 ENCOUNTER — Ambulatory Visit
Admission: RE | Admit: 2024-05-18 | Discharge: 2024-05-18 | Disposition: A | Discharge status: Home / Self Care | Source: Ambulatory Visit | Attending: Family Medicine | Admitting: Family Medicine

## 2024-05-18 DIAGNOSIS — N281 Cyst of kidney, acquired: Secondary | ICD-10-CM | POA: Diagnosis not present

## 2024-05-18 DIAGNOSIS — N2889 Other specified disorders of kidney and ureter: Secondary | ICD-10-CM

## 2024-05-18 MED ORDER — GADOPICLENOL 0.5 MMOL/ML IV SOLN
10.0000 mL | Freq: Once | INTRAVENOUS | Status: AC | PRN
  Administered 2024-05-18: 10 mL via INTRAVENOUS

## 2024-06-10 ENCOUNTER — Ambulatory Visit: Admitting: Urology

## 2024-06-10 VITALS — BP 136/77 | HR 51 | Wt 243.0 lb

## 2024-06-10 DIAGNOSIS — N2889 Other specified disorders of kidney and ureter: Secondary | ICD-10-CM | POA: Diagnosis not present

## 2024-06-10 NOTE — Progress Notes (Signed)
 06/10/24 10:26 AM   Elspeth LELON Quale June 15, 1946 [id number]  CC: Bosniak III 6cm right renal lesion  HPI: 78 year old healthy male with no prior abdominal surgeries, normal renal function(creatinine 1.1, eGFR 70), who is referred for a 6.4 cm complex septated cystic lesion of the right upper pole with enhancing septations consistent with a Bosniak 3 lesion.  MRI was prompted by an abdominal ultrasound from July 2025 for epigastric pain that showed cystic lesion in the right kidney.  In hindsight, the origin of this lesion may have been present on CT abdomen and pelvis with contrast from November 2017 and measured 1.6 cm at that time.  He denies any abdominal pain, gross hematuria, urinary symptoms, no prior abdominal surgeries.   PMH: Past Medical History:  Diagnosis Date   Actinic keratosis    Arthritis    right thumb   Broken wrist    Diabetes mellitus without complication (HCC)    Dysuria    GERD (gastroesophageal reflux disease)    Hyperlipidemia    Hypertension    Wears contact lenses     Surgical History: Past Surgical History:  Procedure Laterality Date   BROW LIFT Right 06/14/2022   Procedure: BLEPHAROPTOSIS REPAIR; RESECT EX RIGHT DIABETIC;  Surgeon: Ashley Greig HERO, MD;  Location: [facility];  Service: Ophthalmology;  Laterality: Right;   COLONOSCOPY  2005   COLONOSCOPY WITH PROPOFOL  N/A 10/16/2016   Procedure: COLONOSCOPY WITH PROPOFOL ;  Surgeon: Reyes LELON Cota, MD;  Location: [facility] ENDOSCOPY;  Service: Endoscopy;  Laterality: N/A;   COLONOSCOPY WITH PROPOFOL  N/A 12/25/2021   Procedure: COLONOSCOPY WITH PROPOFOL ;  Surgeon: Jinny Carmine, MD;  Location: [facility] ENDOSCOPY;  Service: Endoscopy;  Laterality: N/A;   COLONOSCOPY WITH PROPOFOL  N/A 12/31/2022   Procedure: COLONOSCOPY WITH PROPOFOL ;  Surgeon: Jinny Carmine, MD;  Location: [facility] ENDOSCOPY;  Service: Endoscopy;  Laterality: N/A;   ESOPHAGOGASTRODUODENOSCOPY N/A 12/31/2022   Procedure: ESOPHAGOGASTRODUODENOSCOPY  (EGD);  Surgeon: Jinny Carmine, MD;  Location: [facility] ENDOSCOPY;  Service: Endoscopy;  Laterality: N/A;   ESOPHAGOGASTRODUODENOSCOPY (EGD) WITH PROPOFOL  N/A 12/13/2019   Procedure: ESOPHAGOGASTRODUODENOSCOPY (EGD) WITH BIOPSY;  Surgeon: Jinny Carmine, MD;  Location: [facility];  Service: Endoscopy;  Laterality: N/A;  Diabetic - oral meds   PLANTAR'S WART EXCISION     STRABISMUS SURGERY Right      Family History: Family History  Problem Relation Age of Onset   Cancer Mother    Alcohol abuse Father    Heart disease Maternal Uncle    Rheumatic fever Maternal Uncle    Diabetes Paternal Grandmother    Emphysema Paternal Grandfather     Social History:  reports that he quit smoking about 47 years ago. His smoking use included cigarettes. He has never used smokeless tobacco. He reports current alcohol use of about 6.0 standard drinks of alcohol per week. He reports that he does not use drugs.  Physical Exam: BP 136/77 (BP Location: Left Arm, Patient Position: Sitting, Cuff Size: Large)   Pulse (!) 51   Wt 243 lb (110.2 kg)   SpO2 95%   BMI 31.20 kg/m    Constitutional:  Alert and oriented, No acute distress. Cardiovascular: No clubbing, cyanosis, or edema. Respiratory: Normal respiratory effort, no increased work of breathing. GI: Abdomen is soft, nontender, nondistended, no abdominal masses   Laboratory Data: See HPI  Pertinent Imaging: I have personally viewed and interpreted the MRI showing a 6.4 cm septated complex right upper pole renal lesion with some enhancing septations, as well as the  prior CT from 2017 with a possible early 1.6 cm partially cystic lesion in the right upper pole.  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  We had a long conversation today about renal masses and cystic lesions in the Bosniak classification system and treatment options.  Unfortunately not a  candidate for renal mass biopsy based on the cystic nature of the lesion.  Options really would include active surveillance with a repeat MRI in 3 to 6 months, or definitive treatment with partial or radical nephrectomy.  A bit of a challenging location of the upper pole adjacent to the IVC and renal vessels.  With his excellent health he is leaning toward more definitive treatment which I think is reasonable.  I will schedule him for close follow-up with Dr. Georganne to review surgical options.   Redell Burnet, MD 06/10/2024  [facility] [address], [address] [place], [place] [address] [phone]

## 2024-06-10 NOTE — Patient Instructions (Signed)
 You have a 6 cm growth at the top of your right kidney.  This is a Bosniak 3 lesion which is indeterminate for kidney cancer, but would estimate a 50% chance that if removed this which showed kidney cancer.  Unfortunately, cannot biopsy of these cystic lesions.  Options would be monitoring with a repeat MRI in 3 to 6 months, or surgery.  Surgery would be either a partial nephrectomy(removing this part of the kidney) or radical nephrectomy(removing the entire kidney).  We have scheduled follow-up with Dr. Georganne who is the urologist who performs these type of surgeries and will discuss options further.   A Growth in the Kidney (Renal Mass): What to Know  A renal mass is an abnormal growth in the kidney. It may be found during an MRI, CT scan, or ultrasound that's done to check for other problems in the belly. Some renal masses are cancerous, or malignant, and can grow or spread quickly. Others are benign, which means they're not cancer. Renal masses include: Tumors. These may be either cancerous or benign. The most common cancerous tumor in adults is called renal cell carcinoma. In children, the most common type is Wilms tumor. The most common kidney tumors that aren't cancer include renal adenomas, oncocytomas, and angiomyolipomas (AML). Cysts. These are pockets of fluid that form on or in the kidney. What are the causes? Certain types of cancers, infections, or injuries can cause a renal mass. It's not always known what causes a cyst to form in or on the kidney. What are the signs or symptoms? Often, a renal mass or kidney cyst doesn't cause any signs or symptoms. How is this diagnosed? Your health care provider may suggest tests to diagnose the cause of your renal mass. These tests may include: A physical exam. Blood tests. Pee (urine) tests. Imaging tests, such as: CT scan. MRI. Ultrasound. Chest X-ray or bone scan. These may be done if a tumor is cancerous to see if the cancer has spread  outside the kidney. Biopsy. This is when a small piece of tissue is removed from the renal mass for testing. How is this treated? Treatment is not always needed for a renal mass. Treatment will depend on the cause of the mass and if it's causing any problems or symptoms. For a cancerous renal mass, treatment options may include: Surgery. This is done to remove the tumor and any affected tissue. Chemotherapy. This uses medicines to kill cancer cells. Radiation. High-energy X-rays or gamma rays are used to kill cancer cells. Ablation. This uses extreme hot or cold temperature to kill the cancer cells. Immunotherapy. Medicines are used to help the body's defense system (immune system) fight the cancer cells. Taking part in clinical trials. This involves trying new or experimental treatments to see if they're effective. Most kidney cysts don't need to be treated. Follow these instructions at home: What you need to do at home will depend on the cause of the mass. Follow the instructions that your provider gives you. In general: Take medicines only as told. If you were given antibiotics, take them as told. Do not stop taking them even if you start to feel better. Follow any instructions from your provider about what you can and can't do. Do not smoke, vape, or use nicotine or tobacco. Keep all follow-up visits. Your provider will need to check if your renal mass has changed or grown. Contact a health care provider if: You have flank pain, which is pain in your side or  back. You have a fever. You have a loss of appetite. You have pain or swelling in your belly. You lose weight. Get help right away if: Your pain gets worse. There's blood in your pee. You can't pee. This information is not intended to replace advice given to you by your health care provider. Make sure you discuss any questions you have with your health care provider. Document Revised: 03/14/2023 Document Reviewed:  03/14/2023 Elsevier Patient Education  2025 [employer].

## 2024-06-18 DIAGNOSIS — N2889 Other specified disorders of kidney and ureter: Secondary | ICD-10-CM | POA: Insufficient documentation

## 2024-06-18 NOTE — Progress Notes (Signed)
   06/30/2024 10:37 AM   Steven Miranda [birth year] [id number]  Reason for visit: Follow up renal mass   HPI: 78 year old male here for initial follow-up regarding renal mass, recently saw Dr. Francisca Accompanied by his wife and daughter today Patient remains in excellent health Exercise daily, consistent aerobics and cycling No prior abdominal surgeries     Prior HPI: Hx of DM2, HTN, HLD, history of brief PAF(saw cardiology August 2025, Holter revealed no persistent A-fib)  Physical Exam: BP 139/70   Pulse (!) 52   Ht 6' 2 (1.88 m)   Wt 243 lb (110.2 kg)   BMI 31.20 kg/m    Constitutional:  Alert and oriented, No acute distress.   Laboratory Data: Creatinine 1.1 (July 2025)   Latest Reference Range & Units 08/27/23 11:00  Hemoglobin 13.0 - 17.0 g/dL 85.0      Latest Reference Range & Units 05/14/22 09:41  Hemoglobin A1C 4.0 - 5.6 % 6.7 !    Pertinent Imaging: I have personally viewed and interpreted the MRI abdomen (05/18/2024)-large exophytic 6.5 cm multiloculated cyst, Bosniak 3 characteristics, caudal extent borders of the renal vein.  Right renal vein patent.  Contralateral left kidney morphologically normal.   Assessment & Plan:    Renal mass Assessment & Plan: 6.5 cm Right exophytic complex cyst, Bosniak III Multiloculated, internal septations w/ enhancement  I reviewed his clinical history and recent MR imaging.  This is a concerning cystic lesion, with a high chance of underlying malignancy.  Surgical resection recommended.  A partial nephrectomy would be technically challenging, and higher risk of potential complications-but would preserve renal parenchyma and decrease risk of long-term CKD. His cystic mass abuts the Right renal vein and IVC. Radical nephrectomy would be a more technically straightforward operation, with lower cumulative risk and a quicker recovery.  He has a normal contralateral left kidney with normal renal function, and no  significant cardiorenal threats to long-term CKD.  I reviewed the possibility of surgical pathology revealing benign disease-estimate 20-30% possibility.  Preoperative renal mass biopsy infeasible with a predominant cystic lesion.  Lastly, I reviewed a conservative active surveillance option, with re-imaging in 4-6 months and continued monitoring.  Ultimately patient was leaning towards a radical nephrectomy-he preferred a surgery date in November.   I reviewed the radical nephrectomy procedure in detail today, robotic approach.  We would spare the right adrenal gland. I explained the perioperative pathway, expected outcomes, recovery and possible complications.  I would anticipate a overnight hospital stay with pathology follow-up in 1 week.  Intraoperative complications include anesthetic risk, bleeding, damage to surrounding tissue or organs, risk of blood transfusion.  Postoperative complications include delayed bleeding, infection, DVT/PE.  All questions were answered today  - schedule Right robotic radical nephrectomy for ~Nov 2025 - RTC for pre-op visit within 30 days of surgery date- will need interval lab work, staging CXR   Orders: -     Ambulatory Referral For Surgery Scheduling       [name], MD  [facility] [address] [place], [place] [address] [phone]

## 2024-06-18 NOTE — Assessment & Plan Note (Addendum)
 6.5 cm Right exophytic complex cyst, Bosniak III Multiloculated, internal septations w/ enhancement  I reviewed his clinical history and recent MR imaging.  This is a concerning cystic lesion, with a high chance of underlying malignancy.  Surgical resection recommended.  A partial nephrectomy would be technically challenging, and higher risk of potential complications-but would preserve renal parenchyma and decrease risk of long-term CKD. His cystic mass abuts the Right renal vein and IVC. Radical nephrectomy would be a more technically straightforward operation, with lower cumulative risk and a quicker recovery.  He has a normal contralateral left kidney with normal renal function, and no significant cardiorenal threats to long-term CKD.  I reviewed the possibility of surgical pathology revealing benign disease-estimate 20-30% possibility.  Preoperative renal mass biopsy infeasible with a predominant cystic lesion.  Lastly, I reviewed a conservative active surveillance option, with re-imaging in 4-6 months and continued monitoring.  Ultimately patient was leaning towards a radical nephrectomy-he preferred a surgery date in November.   I reviewed the radical nephrectomy procedure in detail today, robotic approach.  We would spare the right adrenal gland. I explained the perioperative pathway, expected outcomes, recovery and possible complications.  I would anticipate a overnight hospital stay with pathology follow-up in 1 week.  Intraoperative complications include anesthetic risk, bleeding, damage to surrounding tissue or organs, risk of blood transfusion.  Postoperative complications include delayed bleeding, infection, DVT/PE.  All questions were answered today  - schedule Right robotic radical nephrectomy for ~Nov 2025 - RTC for pre-op visit within 30 days of surgery date- will need interval lab work, staging CXR

## 2024-06-30 ENCOUNTER — Ambulatory Visit: Admitting: Urology

## 2024-06-30 ENCOUNTER — Other Ambulatory Visit: Payer: Self-pay

## 2024-06-30 VITALS — BP 139/70 | HR 52 | Ht 74.0 in | Wt 243.0 lb

## 2024-06-30 DIAGNOSIS — N2889 Other specified disorders of kidney and ureter: Secondary | ICD-10-CM

## 2024-06-30 NOTE — Progress Notes (Signed)
 Surgical Physician Order Form [facility] [place]  * Scheduling expectation : ~Nov 2025 - per patient request  *Need to schedule with Dr. Francisca assisting   *Length of Case: 3 hours  *Clearance needed: no  *Anticoagulation Instructions: N/A  *Aspirin  Instructions: N/A  *Post-op visit Date/Instructions:  1-2 week follow up  *Diagnosis: Kidney Mass, Right  *Procedure: right Laparoscopic radical nephrectomy (robot or hand assist)([id number])   Additional orders: N/A  -Admit type: Observation  -Anesthesia: General  -VTE Prophylaxis Standing Order SCD's       Other:   -Standing Lab Orders Per Anesthesia    Lab other: CBC, BMP, coag panel, Type and screen, EKG, CXR  -Standing Test orders EKG/Chest x-ray per Anesthesia       Test other:   - Medications:  Ancef 2gm IV  -Other orders:  N/A

## 2024-07-06 ENCOUNTER — Telehealth: Payer: Self-pay

## 2024-07-06 NOTE — Progress Notes (Signed)
    [facility] Surgical Posting Form  Surgery Date: Date: 08/10/2024  Surgeon: Dr. Penne Skye, MD  Inpt ( Yes  )   Outpt (No)   Obs ( No  )   Diagnosis: N28.89 Right Kidney Mass  -CPT: [phone]  Surgery: Right Laparoscopic Robot Assisted Radical Nephrectomy  Stop Anticoagulations: Yes  Cardiac/Medical/Pulmonary Clearance needed: Yes  Clearance needed from Dr: Wilburn with [facility]  Clearance request sent on: Date: 07/06/24  *Orders entered into EPIC  Date: 07/06/24   *Case booked in EPIC  Date: 07/06/24  *Notified pt of Surgery: Date: 07/06/24  PRE-OP UA & CX: yes, and will also obtain CBC, BMP, PT/INR, PTT, Type and Screen, EKG and CXR  *Placed into Prior Authorization Work Que Date: 07/06/24  Assistant/laser/rep:Yes, Dr. Francisca to also Assist.

## 2024-07-06 NOTE — Progress Notes (Signed)
  Phone Number: [phone] for Surgical Coordinator Fax Number: [phone]  REQUEST FOR SURGICAL CLEARANCE      Date: 07/06/2024  Faxed to: Dr. Wilburn, MD with [facility]  Surgeon: Dr. Penne Skye, MD     Date of Surgery: 08/10/2024  Operation: Right Laparoscopic Robot Assisted Radical Nephrectomy  Anesthesia Type: General   Diagnosis: Right Kidney Mass  Patient Requires:   Cardiac / Vascular Clearance : Yes  Risk Assessment:    Low   []       Moderate   []     High   []           This patient is optimized for surgery  YES []       NO   []    I recommend further assessment/workup prior to surgery. YES []      NO  []   Appointment scheduled for: _______________________   Further recommendations: ____________________________________     Physician Signature:__________________________________   Printed Name: ________________________________________   Date: _________________

## 2024-07-06 NOTE — Telephone Encounter (Signed)
 Per Dr. Georganne, Patient is to be scheduled for Right Laparoscopic Robot Assisted Radical Nephrectomy  Steven Miranda was contacted and possible surgical dates were discussed, Tuesday November 11th, 2025 was agreed upon for surgery.   Patient was directed to call [phone] between 1-3pm the day before surgery to find out surgical arrival time.  Instructions were given not to eat or drink from midnight on the night before surgery and have a driver for the day of surgery. On the surgery day patient was instructed to enter through the [facility] entrance of [facility] report the Same Day Surgery desk.   Pre-Admit Testing will be in contact via phone to set up an interview with the anesthesia team to review your history and medications prior to surgery.   Reminder of this information was sent via MyChart to the patient.

## 2024-07-07 DIAGNOSIS — E119 Type 2 diabetes mellitus without complications: Secondary | ICD-10-CM | POA: Diagnosis not present

## 2024-07-07 DIAGNOSIS — N281 Cyst of kidney, acquired: Secondary | ICD-10-CM | POA: Diagnosis not present

## 2024-07-13 DIAGNOSIS — E782 Mixed hyperlipidemia: Secondary | ICD-10-CM | POA: Diagnosis not present

## 2024-07-13 DIAGNOSIS — Z01818 Encounter for other preprocedural examination: Secondary | ICD-10-CM | POA: Diagnosis not present

## 2024-07-13 DIAGNOSIS — I10 Essential (primary) hypertension: Secondary | ICD-10-CM | POA: Diagnosis not present

## 2024-07-13 DIAGNOSIS — E7800 Pure hypercholesterolemia, unspecified: Secondary | ICD-10-CM | POA: Diagnosis not present

## 2024-07-13 DIAGNOSIS — I480 Paroxysmal atrial fibrillation: Secondary | ICD-10-CM | POA: Diagnosis not present

## 2024-07-13 DIAGNOSIS — E1100 Type 2 diabetes mellitus with hyperosmolarity without nonketotic hyperglycemic-hyperosmolar coma (NKHHC): Secondary | ICD-10-CM | POA: Diagnosis not present

## 2024-07-18 NOTE — Assessment & Plan Note (Signed)
 6.5 cm Right exophytic complex cyst, Bosniak III Multiloculated, internal septations w/ enhancement  I reviewed his clinical history and recent MR imaging.  This is a concerning cystic lesion, with a high chance of underlying malignancy.  Surgical resection recommended.  A partial nephrectomy would be technically challenging, and higher risk of potential complications-but would preserve renal parenchyma and decrease risk of long-term CKD. His cystic mass abuts the Right renal vein and IVC. Radical nephrectomy would be a more technically straightforward operation, with lower cumulative risk and a quicker recovery.  He has a normal contralateral left kidney with normal renal function, and no significant cardiorenal threats to long-term CKD.  I reviewed the possibility of surgical pathology revealing benign disease-estimate 20-30% possibility.  Preoperative renal mass biopsy infeasible with a predominant cystic lesion.  Lastly, I reviewed a conservative active surveillance option, with re-imaging in 4-6 months and continued monitoring.  Ultimately patient was leaning towards a radical nephrectomy-he preferred a surgery date in November.   I reviewed the radical nephrectomy procedure in detail today, robotic approach.  We would spare the right adrenal gland. I explained the perioperative pathway, expected outcomes, recovery and possible complications.  I would anticipate a overnight hospital stay with pathology follow-up in 1 week.  Intraoperative complications include anesthetic risk, bleeding, damage to surrounding tissue or organs, risk of blood transfusion.  Postoperative complications include delayed bleeding, infection, DVT/PE.  All questions were answered today  - schedule Right robotic radical nephrectomy for ~Nov 2025 - RTC for pre-op visit within 30 days of surgery date- will need interval lab work, staging CXR

## 2024-07-18 NOTE — H&P (View-Only) (Signed)
   07/21/2024 7:33 AM   Steven Miranda [birth year] [id number]  Reason for visit: Follow up Right renal mass   HPI: 78 y.o. male, follow up with me today Pre op visit for surgery, 08/10/24 - Right robotic radical nephrectomy  Prior HPI: 6.5 cm Right exophytic complex cyst, Bosniak III Multiloculated, internal septations w/ enhancement  Patient remains in excellent health Exercise daily, consistent aerobics and cycling No prior abdominal surgeries         Physical Exam: There were no vitals taken for this visit.   Constitutional:  Alert and oriented, No acute distress. GV: RRR, normal breathing, CTAB  Laboratory Data: pending    Assessment & Plan:    Renal mass Assessment & Plan: 6.5 cm Right exophytic complex cyst, Bosniak III Multiloculated, internal septations w/ enhancement  I reviewed his clinical history and recent MR imaging.  This is a concerning cystic lesion, with a high chance of underlying malignancy.  Surgical resection recommended.  A partial nephrectomy would be technically challenging, and higher risk of potential complications-but would preserve renal parenchyma and decrease risk of long-term CKD. His cystic mass abuts the Right renal vein and IVC. Radical nephrectomy would be a more technically straightforward operation, with lower cumulative risk and a quicker recovery.  He has a normal contralateral left kidney with normal renal function, and no significant cardiorenal threats to long-term CKD.  I reviewed the possibility of surgical pathology revealing benign disease-estimate 20-30% possibility.  Preoperative renal mass biopsy infeasible with a predominant cystic lesion.  Lastly, I reviewed a conservative active surveillance option, with re-imaging in 4-6 months and continued monitoring.  Ultimately patient was leaning towards a radical nephrectomy-he preferred a surgery date in November.   I reviewed the radical nephrectomy procedure in detail today,  robotic approach.  We would spare the right adrenal gland. I explained the perioperative pathway, expected outcomes, recovery and possible complications.  I would anticipate a overnight hospital stay with pathology follow-up in 1 week.  Intraoperative complications include anesthetic risk, bleeding, damage to surrounding tissue or organs, risk of blood transfusion.  Postoperative complications include delayed bleeding, infection, DVT/PE.  All questions were answered today  - schedule Right robotic radical nephrectomy for ~Nov 2025 - RTC for pre-op visit within 30 days of surgery date- will need interval lab work, staging CXR         Steven JONELLE Skye, MD  [facility] [address] [place], [place] [address] [phone]

## 2024-07-18 NOTE — Progress Notes (Unsigned)
   07/21/2024 7:33 AM   Steven Miranda [birth year] [id number]  Reason for visit: Follow up Right renal mass   HPI: 78 y.o. male, follow up with me today Pre op visit for surgery, 08/10/24 - Right robotic radical nephrectomy  Prior HPI: 6.5 cm Right exophytic complex cyst, Bosniak III Multiloculated, internal septations w/ enhancement  Patient remains in excellent health Exercise daily, consistent aerobics and cycling No prior abdominal surgeries         Physical Exam: There were no vitals taken for this visit.   Constitutional:  Alert and oriented, No acute distress. GV: RRR, normal breathing, CTAB  Laboratory Data: pending    Assessment & Plan:    Renal mass Assessment & Plan: 6.5 cm Right exophytic complex cyst, Bosniak III Multiloculated, internal septations w/ enhancement  I reviewed his clinical history and recent MR imaging.  This is a concerning cystic lesion, with a high chance of underlying malignancy.  Surgical resection recommended.  A partial nephrectomy would be technically challenging, and higher risk of potential complications-but would preserve renal parenchyma and decrease risk of long-term CKD. His cystic mass abuts the Right renal vein and IVC. Radical nephrectomy would be a more technically straightforward operation, with lower cumulative risk and a quicker recovery.  He has a normal contralateral left kidney with normal renal function, and no significant cardiorenal threats to long-term CKD.  I reviewed the possibility of surgical pathology revealing benign disease-estimate 20-30% possibility.  Preoperative renal mass biopsy infeasible with a predominant cystic lesion.  Lastly, I reviewed a conservative active surveillance option, with re-imaging in 4-6 months and continued monitoring.  Ultimately patient was leaning towards a radical nephrectomy-he preferred a surgery date in November.   I reviewed the radical nephrectomy procedure in detail today,  robotic approach.  We would spare the right adrenal gland. I explained the perioperative pathway, expected outcomes, recovery and possible complications.  I would anticipate a overnight hospital stay with pathology follow-up in 1 week.  Intraoperative complications include anesthetic risk, bleeding, damage to surrounding tissue or organs, risk of blood transfusion.  Postoperative complications include delayed bleeding, infection, DVT/PE.  All questions were answered today  - schedule Right robotic radical nephrectomy for ~Nov 2025 - RTC for pre-op visit within 30 days of surgery date- will need interval lab work, staging CXR         Steven JONELLE Skye, MD  [facility] [address] [place], [place] [address] [phone]

## 2024-07-21 ENCOUNTER — Ambulatory Visit: Admitting: Urology

## 2024-07-21 VITALS — BP 134/79 | Ht 74.0 in | Wt 243.0 lb

## 2024-07-21 DIAGNOSIS — N2889 Other specified disorders of kidney and ureter: Secondary | ICD-10-CM

## 2024-07-21 NOTE — Patient Instructions (Addendum)
 Expect a call on 11/4 to discuss meds- typically bring patients in about 1 week prior for labs- They will schedule labs when they talk to him on 11/4.

## 2024-08-03 ENCOUNTER — Encounter
Admission: RE | Admit: 2024-08-03 | Discharge: 2024-08-03 | Disposition: A | Discharge status: Home / Self Care | Source: Ambulatory Visit | Attending: Urology | Admitting: Urology

## 2024-08-03 ENCOUNTER — Other Ambulatory Visit: Payer: Self-pay

## 2024-08-03 VITALS — Ht 74.0 in | Wt 242.0 lb

## 2024-08-03 DIAGNOSIS — E119 Type 2 diabetes mellitus without complications: Secondary | ICD-10-CM

## 2024-08-03 HISTORY — DX: Other specified disorders of kidney and ureter: N28.89

## 2024-08-03 HISTORY — DX: Paroxysmal atrial fibrillation: I48.0

## 2024-08-03 NOTE — Patient Instructions (Addendum)
 Your procedure is scheduled on: Tuesday 08/10/24  Report to the Registration Desk on the 1st floor of the [facility]. To find out your arrival time, please call [phone] between 1PM - 3PM on: Monday 08/09/24 If your arrival time is 6:00 am, do not arrive before that time as the [facility] entrance doors do not open until 6:00 am.  REMEMBER: Instructions that are not followed completely may result in serious medical risk, up to and including death; or upon the discretion of your surgeon and anesthesiologist your surgery may need to be rescheduled.  Do not eat food or drink fluids after midnight the night before surgery.  No gum chewing or hard candies.   One week prior to surgery: Stop Anti-inflammatories (NSAIDS) such as Advil, Aleve , Ibuprofen, Motrin, Naproxen , Naprosyn  and Aspirin  based products such as Excedrin, Goody's Powder, BC Powder. Stop ANY OVER THE COUNTER supplements until after surgery.  Stop empagliflozin  (JARDIANCE ) 25 MG 3 days prior to surgery (take last dose Friday 08/06/24) Stop metFORMIN  (GLUCOPHAGE ) 1000 MG 2 days prior to surgery (take last dose Saturday 08/07/24)  You may however, continue to take Tylenol  if needed for pain up until the day of surgery.  Continue taking all of your other prescription medications up until the day of surgery.  ON THE DAY OF SURGERY ONLY TAKE THESE MEDICATIONS WITH SIPS OF WATER:  None   Use inhalers on the day of surgery and bring to the hospital. fluticasone  (FLONASE ) 50 MCG/ACT nasal spray (if needed)  No Alcohol for 24 hours before or after surgery.  No Smoking including e-cigarettes for 24 hours before surgery.  No chewable tobacco products for at least 6 hours before surgery.  No nicotine patches on the day of surgery.  Do not use any recreational drugs for at least a week (preferably 2 weeks) before your surgery.  Please be advised that the combination of cocaine and anesthesia may have negative outcomes, up  to and including death. If you test positive for cocaine, your surgery will be cancelled.  On the morning of surgery brush your teeth with toothpaste and water, you may rinse your mouth with mouthwash if you wish. Do not swallow any toothpaste or mouthwash.  Use CHG Soap or wipes as directed on instruction sheet.  Do not wear jewelry, make-up, hairpins, clips or nail polish.  For welded (permanent) jewelry: bracelets, anklets, waist bands, etc.  Please have this removed prior to surgery.  If it is not removed, there is a chance that hospital personnel will need to cut it off on the day of surgery.  Do not wear lotions, powders, or perfumes.   Do not shave body hair from the neck down 48 hours before surgery.  Contact lenses, hearing aids and dentures may not be worn into surgery.  Do not bring valuables to the hospital. [facility] is not responsible for any missing/lost belongings or valuables.   Notify your doctor if there is any change in your medical condition (cold, fever, infection).  Wear comfortable clothing (specific to your surgery type) to the hospital.  After surgery, you can help prevent lung complications by doing breathing exercises.  Take deep breaths and cough every 1-2 hours. Your doctor may order a device called an Incentive Spirometer to help you take deep breaths. When coughing or sneezing, hold a pillow firmly against your incision with both hands. This is called "splinting." Doing this helps protect your incision. It also decreases belly discomfort.  If you are being  admitted to the hospital overnight, leave your suitcase in the car. After surgery it may be brought to your room.  In case of increased patient census, it may be necessary for you, the patient, to continue your postoperative care in the Same Day Surgery department.  If you are being discharged the day of surgery, you will not be allowed to drive home. You will need a responsible individual to drive  you home and stay with you for 24 hours after surgery.   If you are taking public transportation, you will need to have a responsible individual with you.  Please call the Pre-admissions Testing Dept. at [phone] if you have any questions about these instructions.  Surgery Visitation Policy:  Patients having surgery or a procedure may have two visitors.  Children under the age of 66 must have an adult with them who is not the patient.  Inpatient Visitation:    Visiting hours are 7 a.m. to 8 p.m. Up to four visitors are allowed at one time in a patient room. The visitors may rotate out with other people during the day.  One visitor age 76 or older may stay with the patient overnight and must be in the room by 8 p.m.   Merchandiser, Retail to address health-related social needs:  [email or web address]                                                                                                            Preparing for Surgery with CHLORHEXIDINE GLUCONATE (CHG) Soap  Chlorhexidine Gluconate (CHG) Soap  o An antiseptic cleaner that kills germs and bonds with the skin to continue killing germs even after washing  o Used for showering the night before surgery and morning of surgery  Before surgery, you can play an important role by reducing the number of germs on your skin.  CHG (Chlorhexidine gluconate) soap is an antiseptic cleanser which kills germs and bonds with the skin to continue killing germs even after washing.  Please do not use if you have an allergy to CHG or antibacterial soaps. If your skin becomes reddened/irritated stop using the CHG.  1. Shower the NIGHT BEFORE SURGERY with CHG soap.  2. If you choose to wash your hair, wash your hair first as usual with your normal shampoo.  3. After shampooing, rinse your hair and body thoroughly to remove the shampoo.  4. Use CHG as you would any other liquid soap. You can apply CHG directly to the  skin and wash gently with a clean washcloth.  5. Apply the CHG soap to your body only from the neck down. Do not use on open wounds or open sores. Avoid contact with your eyes, ears, mouth, and genitals (private parts). Wash face and genitals (private parts) with your normal soap.  6. Wash thoroughly, paying special attention to the area where your surgery will be performed.  7. Thoroughly rinse your body with warm water.  8. Do not shower/wash with your normal soap after using and rinsing off the CHG soap.  9. Do not use lotions, oils, etc., after showering with CHG.  10. Pat yourself dry with a clean towel.  11. Wear clean pajamas to bed the night before surgery.  12. Place clean sheets on your bed the night of your shower and do not sleep with pets.  13. Do not apply any deodorants/lotions/powders.  14. Please wear clean clothes to the hospital.  15. Remember to brush your teeth with your regular toothpaste.

## 2024-08-04 ENCOUNTER — Encounter: Payer: Self-pay | Admitting: Urology

## 2024-08-04 ENCOUNTER — Ambulatory Visit: Admission: RE | Admit: 2024-08-04 | Source: Ambulatory Visit

## 2024-08-04 ENCOUNTER — Ambulatory Visit
Admission: RE | Admit: 2024-08-04 | Discharge: 2024-08-04 | Disposition: A | Discharge status: Home / Self Care | Source: Ambulatory Visit | Attending: Urgent Care | Admitting: Urgent Care

## 2024-08-04 ENCOUNTER — Inpatient Hospital Stay: Admission: RE | Admit: 2024-08-04 | Discharge: 2024-08-04 | Attending: Urology

## 2024-08-04 DIAGNOSIS — Z01812 Encounter for preprocedural laboratory examination: Secondary | ICD-10-CM | POA: Insufficient documentation

## 2024-08-04 DIAGNOSIS — Z01818 Encounter for other preprocedural examination: Secondary | ICD-10-CM | POA: Insufficient documentation

## 2024-08-04 DIAGNOSIS — Z01811 Encounter for preprocedural respiratory examination: Secondary | ICD-10-CM | POA: Diagnosis not present

## 2024-08-04 DIAGNOSIS — N2889 Other specified disorders of kidney and ureter: Secondary | ICD-10-CM | POA: Diagnosis not present

## 2024-08-04 LAB — BASIC METABOLIC PANEL WITH GFR
Anion gap: 8 (ref 5–15)
BUN: 13 mg/dL (ref 8–23)
CO2: 26 mmol/L (ref 22–32)
Calcium: 9 mg/dL (ref 8.9–10.3)
Chloride: 103 mmol/L (ref 98–111)
Creatinine, Ser: 0.84 mg/dL (ref 0.61–1.24)
GFR, Estimated: >60 mL/min (ref >60)
Glucose, Bld: 139 mg/dL — ABNORMAL HIGH (ref 70–99)
Potassium: 4.4 mmol/L (ref 3.5–5.1)
Sodium: 137 mmol/L (ref 135–145)

## 2024-08-04 LAB — CBC
HCT: 41.7 % (ref 39.0–52.0)
Hemoglobin: 14.8 g/dL (ref 13.0–17.0)
MCH: 32.2 pg (ref 26.0–34.0)
MCHC: 35.5 g/dL (ref 30.0–36.0)
MCV: 90.7 fL (ref 80.0–100.0)
Platelets: 255 K/uL (ref 150–400)
RBC: 4.6 MIL/uL (ref 4.22–5.81)
RDW: 12.6 % (ref 11.5–15.5)
WBC: 4.8 K/uL (ref 4.0–10.5)
nRBC: 0 % (ref 0.0–0.2)

## 2024-08-04 LAB — URINALYSIS, COMPLETE (UACMP) WITH MICROSCOPIC
Bacteria, UA: NONE SEEN
Bilirubin Urine: NEGATIVE
Glucose, UA: >=500 mg/dL — AB
Hgb urine dipstick: NEGATIVE
Ketones, ur: NEGATIVE mg/dL
Leukocytes,Ua: NEGATIVE
Nitrite: NEGATIVE
Protein, ur: NEGATIVE mg/dL
Specific Gravity, Urine: 1.009 (ref 1.005–1.030)
Squamous Epithelial / HPF: 0 /HPF (ref 0–5)
pH: 6 (ref 5.0–8.0)

## 2024-08-04 LAB — TYPE AND SCREEN
ABO/RH(D): A POS
Antibody Screen: NEGATIVE

## 2024-08-04 LAB — PROTIME-INR
INR: 0.9 (ref 0.8–1.2)
Prothrombin Time: 13 s (ref 11.4–15.2)

## 2024-08-04 LAB — APTT: aPTT: 32 s (ref 24–36)

## 2024-08-04 NOTE — Progress Notes (Signed)
 Perioperative / Anesthesia Services  Pre-Admission Testing Clinical Review / Pre-Operative Anesthesia Consult  Date: 08/05/24  PATIENT DEMOGRAPHICS: Name: Steven Miranda DOB: 12-25-1945 MRN:   [id number]  Note: Available PAT nursing documentation and vital signs have been reviewed. Clinical nursing staff has updated patient's PMH/PSHx, current medication list, and drug allergies/intolerances to ensure complete and comprehensive history available to assist care teams in MDM as it pertains to the aforementioned surgical procedure and anticipated anesthetic course. Extensive review of available clinical information personally performed. Nursing documentation reviewed. [place] PMH and PSHx updated with any diagnoses and/or procedures that I have knowledge of that may have been inadvertently omitted during his intake with the pre-admission testing department's nursing staff.  PLANNED SURGICAL PROCEDURE(S):   Case: [id number] Date/Time: 08/10/24 0715   Procedure: NEPHRECTOMY, RADICAL, ROBOT-ASSISTED, LAPAROSCOPIC, ADULT (Right)   Anesthesia type: General   Diagnosis: Right kidney mass [N28.89]   Pre-op diagnosis: Right Kidney Mass   Location: [facility] OR ROOM 07 / [facility]   Surgeons: Georganne Penne SAUNDERS, MD        CLINICAL DISCUSSION: Steven Miranda is a 78 y.o. male who is submitted for pre-surgical anesthesia review and clearance prior to him undergoing the above procedure. Patient is a Former Smoker (quit 09/1976). Pertinent PMH includes: PAF, diastolic dysfunction, aortic atherosclerosis, ascending aorta dilatation, HTN, HLD, T2DM, GERD (on daily PPI), Barrett's esophagus, RIGHT renal mass, OA, thoracic DDD.  Patient is followed by cardiology [name], MD). He was last seen in the cardiology clinic on 07/13/2024; notes reviewed. At the time of his clinic visit, patient doing well overall from a cardiovascular perspective. Patient denied any chest pain, shortness of breath,  PND, orthopnea, palpitations, significant peripheral edema, weakness, fatigue, vertiginous symptoms, or presyncope/syncope. Patient with a past medical history significant for cardiovascular diagnoses. Documented physical exam was grossly benign, providing no evidence of acute exacerbation and/or decompensation of the patient's known cardiovascular conditions.  Most recent TTE performed on 03/29/2022 revealed a normal left ventricular systolic function with an EF of 55%. There was no LVH.  There were no regional wall motion abnormalities. Left ventricular diastolic Doppler parameters consistent with abnormal relaxation (G1DD). -19.4% (normal range <-18%).  Right atrium mildly dilated.  Right ventricle was enlarged with normal systolic function and a  TAPSE measuring 2.5 cm  (normal range >/= 1.6 cm).  There was no significant valvular regurgitation.  All transvalvular gradients were noted to be normal providing no evidence of hemodynamically significant valvular stenosis.  Mild dilatation of the ascending aorta measuring up to 42 mm was noted.    Long-term cardiac event monitor study performed on 05/06/2022 revealing a predominant underlying sinus rhythm with first-degree AV block at an average rate of 71 bpm; range 40-150 bpm.  There were 16 runs of PSVT with the fastest interval lasting 5 beats at a maximum rate of 150 bpm, and the longest lasting 15 beats and average rate of 94 bpm.  It was questionable that some of the PSVT episodes may have also been atrial tachycardia with variable AV block.  Isolated atrial ectopy was noted.  There were no sustained arrhythmias or prolonged pauses.  No patient triggered events.  Coronary CTA was performed on 09/11/2023 that demonstrated an Agatston coronary artery calcium  score of 0. Study demonstrated normal coronary origin with RIGHT dominance.  Patient with an atrial fibrillation diagnosis; CHA2DS2-VASc Score = 5 (age x 2, HTN, vascular disease, T2DM).rate and  rhythm being maintained on oral diltiazem .  Patient  is not currently on prescribed oral anticoagulation therapy.  T2DM well-controlled at 120/64 1 prescribed ARB (losartan) and CCB (diltiazem ) therapies.  Patient is on atorvastatin  for his HLD diagnosis and further ASCVD prevention.  T2DM well-controlled on currently prescribed regimen; last HgbA1c was 7.1% when checked on 07/07/2024.  In the setting of known cardiovascular diagnoses and concurrent T2DM, patient is on an SGLT2i (empagliflozin ) for added cardiovascular and renovascular protection.  Patient does not have an OSAH diagnosis. Patient is able to complete all of his  ADL/IADLs without cardiovascular limitation.  Per the DASI, patient is able to achieve at least 4 METS of physical activity without experiencing any significant degree of angina/anginal equivalent symptoms.  No changes were made to his medication regimen during his visit with cardiology. Patient scheduled to follow-up with outpatient cardiology in 6 months or sooner if needed.  Steven Miranda is scheduled for an elective NEPHRECTOMY, RADICAL, ROBOT-ASSISTED, LAPAROSCOPIC, ADULT (Right) on 08/10/2024 with Dr. [name], MD. Given patient's past medical history significant for cardiovascular diagnoses, presurgical cardiac clearance was sought by the PAT team. Per cardiology, stable from cardiac standpoint without any complaints. Euvolemic on exam. Blood pressure well-controlled at home. Regarding preop risk stratification, he will be at LOW risk for laparoscopic nephrectomy. No further cardiac evaluation is indicated. Optimized from cardiac standpoint.  In review of his medication reconciliation, the patient is not noted to be taking any type of anticoagulation or antiplatelet therapies that would need to be held during his perioperative course.  Patient denies previous perioperative complications with anesthesia in the past. In review his EMR, it is noted that patient underwent a  general anesthetic course here at [facility] (ASA II) in 12/2022 without documented complications.   MOST RECENT VITAL SIGNS:    08/03/2024    9:43 AM 07/21/2024    9:45 AM 06/30/2024   10:02 AM  Vitals with BMI  Height 6' 2 6' 2 6' 2  Weight 242 lbs 243 lbs 243 lbs  BMI 31.06 31.19 31.19  Systolic  134 139  Diastolic  79 70  Pulse   52   PROVIDERS/SPECIALISTS: NOTE: Primary physician provider listed below. Patient may have been seen by APP or partner within same practice.   PROVIDER ROLE / SPECIALTY LAST SHERLEAN Skye Penne JONELLE, MD Urology (Surgeon) 07/21/2024  Bertrum Charlie CROME, MD Primary Care Provider 07/07/2024  Wilburn Fillers, MD Cardiology 07/13/2024   ALLERGIES: No Known Allergies  CURRENT HOME MEDICATIONS: No current facility-administered medications for this encounter.    atorvastatin  (LIPITOR) 20 MG tablet   diltiazem  (CARDIZEM  CD) 120 MG 24 hr capsule   empagliflozin  (JARDIANCE ) 25 MG TABS tablet   fluticasone  (FLONASE ) 50 MCG/ACT nasal spray   hyoscyamine  (LEVSIN  SL) 0.125 MG SL tablet   losartan (COZAAR) 50 MG tablet   metFORMIN  (GLUCOPHAGE ) 1000 MG tablet   Misc Natural Products (OSTEO BI-FLEX ADV DOUBLE ST) CAPS   pantoprazole  (PROTONIX ) 40 MG tablet   pioglitazone  (ACTOS ) 15 MG tablet   Probiotic Product (PROBIOTIC FORMULA) CAPS   sucralfate  (CARAFATE ) 1 g tablet   Blood Glucose Monitoring Suppl (ACCU-CHEK NANO SMARTVIEW) w/Device KIT   famotidine  (PEPCID ) 40 MG tablet   glucose blood (TRUE METRIX BLOOD GLUCOSE TEST) test strip   Lancets (ONETOUCH ULTRASOFT) lancets   HISTORY: Past Medical History:  Diagnosis Date   Actinic keratosis    Aortic atherosclerosis    Arthritis    right thumb   Ascending aorta dilatation  Barrett's esophagus    DDD (degenerative disc disease), thoracic    Diastolic dysfunction    Dysuria    GERD (gastroesophageal reflux disease)    Hepatic steatosis    Hyperlipidemia     Hypertension    Paroxysmal atrial fibrillation (HCC)    a.) CHA2DS2VASc = 5 (age x2, HTN, vascular disease, T2DM) as of 08/04/2024; b.) rate/rhythm maintained on oral diltiazem ; no chronic OAC   Renal mass, right    T2DM (type 2 diabetes mellitus) (HCC)    Wears contact lenses    Past Surgical History:  Procedure Laterality Date   BROW LIFT Right 06/14/2022   Procedure: BLEPHAROPTOSIS REPAIR; RESECT EX RIGHT DIABETIC;  Surgeon: Ashley Greig HERO, MD;  Location: [facility];  Service: Ophthalmology;  Laterality: Right;   COLONOSCOPY  2005   COLONOSCOPY WITH PROPOFOL  N/A 10/16/2016   Procedure: COLONOSCOPY WITH PROPOFOL ;  Surgeon: Reyes LELON Cota, MD;  Location: [facility] ENDOSCOPY;  Service: Endoscopy;  Laterality: N/A;   COLONOSCOPY WITH PROPOFOL  N/A 12/25/2021   Procedure: COLONOSCOPY WITH PROPOFOL ;  Surgeon: Jinny Carmine, MD;  Location: [facility];  Service: Endoscopy;  Laterality: N/A;   COLONOSCOPY WITH PROPOFOL  N/A 12/31/2022   Procedure: COLONOSCOPY WITH PROPOFOL ;  Surgeon: Jinny Carmine, MD;  Location: [facility] ENDOSCOPY;  Service: Endoscopy;  Laterality: N/A;   ESOPHAGOGASTRODUODENOSCOPY N/A 12/31/2022   Procedure: ESOPHAGOGASTRODUODENOSCOPY (EGD);  Surgeon: Jinny Carmine, MD;  Location: [facility] ENDOSCOPY;  Service: Endoscopy;  Laterality: N/A;   ESOPHAGOGASTRODUODENOSCOPY (EGD) WITH PROPOFOL  N/A 12/13/2019   Procedure: ESOPHAGOGASTRODUODENOSCOPY (EGD) WITH BIOPSY;  Surgeon: Jinny Carmine, MD;  Location: [facility];  Service: Endoscopy;  Laterality: N/A;  Diabetic - oral meds   PLANTAR'S WART EXCISION     STRABISMUS SURGERY Right    Family History  Problem Relation Age of Onset   Cancer Mother    Alcohol abuse Father    Heart disease Maternal Uncle    Rheumatic fever Maternal Uncle    Diabetes Paternal Grandmother    Emphysema Paternal Grandfather    Social History   Tobacco Use   Smoking status: Former    Current packs/day: 0.00    Types: Cigarettes    Quit date:  1978    Years since quitting: 47.8   Smokeless tobacco: Never   Tobacco comments:    quit in 1978  Substance Use Topics   Alcohol use: Yes    Alcohol/week: 6.0 standard drinks of alcohol    Types: 6 Cans of beer per week    Comment: a couple beers on the weekends   LABS:  Hospital Outpatient Visit on 08/04/2024  Component Date Value Ref Range Status   ABO/RH(D) 08/04/2024 A POS   Final   Antibody Screen 08/04/2024 NEG   Final   Sample Expiration 08/04/2024 08/18/2024,2359   Final   Extend sample reason 08/04/2024    Final                   Value:NO TRANSFUSIONS OR PREGNANCY IN THE PAST 3 MONTHS Performed at [facility], [address] [place], [place] [address]    WBC 08/04/2024 4.8  4.0 - 10.5 K/uL Final   RBC 08/04/2024 4.60  4.22 - 5.81 MIL/uL Final   Hemoglobin 08/04/2024 14.8  13.0 - 17.0 g/dL Final   HCT 88/94/7974 41.7  39.0 - 52.0 % Final   MCV 08/04/2024 90.7  80.0 - 100.0 fL Final   MCH 08/04/2024 32.2  26.0 - 34.0 pg Final   MCHC 08/04/2024 35.5  30.0 - 36.0 g/dL Final   RDW 88/94/7974 12.6  11.5 - 15.5 % Final   Platelets 08/04/2024 255  150 - 400 K/uL Final   nRBC 08/04/2024 0.0  0.0 - 0.2 % Final   Performed at [facility], [address] [place], [place] [address]   Sodium 08/04/2024 137  135 - 145 mmol/L Final   Potassium 08/04/2024 4.4  3.5 - 5.1 mmol/L Final   Chloride 08/04/2024 103  98 - 111 mmol/L Final   CO2 08/04/2024 26  22 - 32 mmol/L Final   Glucose, Bld 08/04/2024 139 (H)  70 - 99 mg/dL Final   Glucose reference range applies only to samples taken after fasting for at least 8 hours.   BUN 08/04/2024 13  8 - 23 mg/dL Final   Creatinine, Ser 08/04/2024 0.84  0.61 - 1.24 mg/dL Final   Calcium  08/04/2024 9.0  8.9 - 10.3 mg/dL Final   GFR, Estimated 08/04/2024 >60  >60 mL/min Final   Comment: (NOTE) Calculated using the CKD-EPI Creatinine Equation (2021)    Anion gap 08/04/2024 8  5 - 15 Final   Performed at [facility], [address] [place], [place] [address]   Prothrombin Time 08/04/2024 13.0  11.4 - 15.2 seconds Final   INR 08/04/2024 0.9  0.8 - 1.2 Final   Comment: (NOTE) INR goal varies based on device and disease states. Performed at [facility], [address] [place], [place] [address]    aPTT 08/04/2024 32  24 - 36 seconds Final   Performed at [facility], [address] [place], [place] [address]   Color, Urine 08/04/2024 YELLOW (A)  YELLOW Final   APPearance 08/04/2024 CLEAR (A)  CLEAR Final   Specific Gravity, Urine 08/04/2024 1.009  1.005 - 1.030 Final   pH 08/04/2024 6.0  5.0 - 8.0 Final   Glucose, UA 08/04/2024 >=500 (A)  NEGATIVE mg/dL Final   Hgb urine dipstick 08/04/2024 NEGATIVE  NEGATIVE Final   Bilirubin Urine 08/04/2024 NEGATIVE  NEGATIVE Final   Ketones, ur 08/04/2024 NEGATIVE  NEGATIVE mg/dL Final   Protein, ur 88/94/7974 NEGATIVE  NEGATIVE mg/dL Final   Nitrite 88/94/7974 NEGATIVE  NEGATIVE Final   Leukocytes,Ua 08/04/2024 NEGATIVE  NEGATIVE Final   RBC / HPF 08/04/2024 0-5  0 - 5 RBC/hpf Final   WBC, UA 08/04/2024 0-5  0 - 5 WBC/hpf Final   Bacteria, UA 08/04/2024 NONE SEEN  NONE SEEN Final   Squamous Epithelial / HPF 08/04/2024 0  0 - 5 /HPF Final   Performed at [facility], [address] [place], [place] [address]    ECG: Date: 07/13/2024 Time ECG obtained: 1113 AM Rate: 69 bpm Rhythm: Sinus rhythm with PACs Axis (leads I and aVF): normal Intervals: PR 186 ms. QRS 86 ms. QTc 4 7 ms. ST segment and T wave changes: No evidence of acute T wave abnormalities or significant ST segment elevation or depression.  Evidence of a possible, age undetermined, prior infarct:  No Comparison: Tracing obtained on 04/01/2024 showed normal sinus rhythm at a rate of 79 bpm.  There were no ST/T wave changes. NOTE: Tracing obtained at [facility]; unable for review. Above based on cardiologist's interpretation.    IMAGING /  PROCEDURES: DG CHEST 2 VIEW performed on 08/04/2024 No focal pulmonary opacities. No pulmonary edema. No pleural effusion or pneumothorax. No acute abnormality of the cardiac and mediastinal silhouettes. Mild multilevel degenerative changes of thoracic spine No acute cardiopulmonary  process.  MR ABDOMEN WWO CONTRAST performed on 05/18/2024 6.4 cm complex septated cystic lesion medially in the upper pole of the right kidney with thin enhancing septations in the superior component. This is consistent with an indeterminate cystic renal mass, best classified as Bosniak III. Urology consultation recommended. Additional simple cyst in the interpolar region of the right kidney (Bosniak 1). No suspicious left renal lesions. No adenopathy. No acute abdominal findings.  US  ABDOMEN COMPLETE performed on 04/01/2024 No acute abnormality. Hepatic steatosis. Complex right renal cyst measuring up to 7.0 cm. Further evaluation with nonemergent renal protocol CT or MRI with and without IV contrast is recommended.  IMPRESSION AND PLAN: Steven Miranda has been referred for pre-anesthesia review and clearance prior to him undergoing the planned anesthetic and procedural courses. Available labs, pertinent testing, and imaging results were personally reviewed by me in preparation for upcoming operative/procedural course. [facility] medical record has been updated following extensive record review and patient interview with PAT staff.   This patient has been appropriately cleared by cardiology with an overall LOW risk of patient experiencing significant perioperative cardiovascular complications. Based on clinical review performed today (08/05/24), barring any significant acute changes in the patient's overall condition, it is anticipated that he will be able to proceed with the planned surgical intervention. Any acute changes in clinical condition may necessitate his procedure being postponed and/or cancelled.  Patient will meet with anesthesia team (MD and/or CRNA) on the day of his procedure for preoperative evaluation/assessment. Questions regarding anesthetic course will be fielded at that time.   Pre-surgical instructions were reviewed with the patient during his PAT appointment, and questions were fielded to satisfaction by PAT clinical staff. He has been instructed on which medications that he will need to hold prior to surgery, as well as the ones that have been deemed safe/appropriate to take on the day of his procedure. As part of the general education provided by PAT, patient made aware both verbally and in writing, that he would need to abstain from the use of any illegal substances during his perioperative course. He was advised that failure to follow the provided instructions could necessitate case cancellation or result in serious perioperative complications up to and including death. Patient encouraged to contact PAT and/or his surgeon's office to discuss any questions or concerns that may arise prior to surgery; verbalized understanding.   Dorise Pereyra, MSN, APRN, FNP-C, CEN [facility]  Perioperative Services Nurse Practitioner Phone: [phone] Fax: [phone] 08/05/24 11:30 AM  NOTE: This note has been prepared using Dragon dictation software. Despite my best ability to proofread, there is always the potential that unintentional transcriptional errors may still occur from this process.

## 2024-08-05 ENCOUNTER — Encounter: Payer: Self-pay | Admitting: Urology

## 2024-08-06 LAB — URINE CULTURE: Culture: NO GROWTH

## 2024-08-10 ENCOUNTER — Inpatient Hospital Stay: Payer: Self-pay | Admitting: Urgent Care

## 2024-08-10 ENCOUNTER — Encounter: Payer: Self-pay | Admitting: Urology

## 2024-08-10 ENCOUNTER — Inpatient Hospital Stay
Admission: RE | Admit: 2024-08-10 | Discharge: 2024-08-11 | DRG: 658 | Disposition: A | Discharge status: Home / Self Care | Attending: Urology | Admitting: Urology

## 2024-08-10 ENCOUNTER — Encounter
Admission: RE | Disposition: A | Discharge status: Home / Self Care | Payer: Self-pay | Source: Home / Self Care | Attending: Urology

## 2024-08-10 ENCOUNTER — Other Ambulatory Visit: Payer: Self-pay

## 2024-08-10 DIAGNOSIS — Z8249 Family history of ischemic heart disease and other diseases of the circulatory system: Secondary | ICD-10-CM | POA: Diagnosis not present

## 2024-08-10 DIAGNOSIS — I10 Essential (primary) hypertension: Secondary | ICD-10-CM | POA: Diagnosis not present

## 2024-08-10 DIAGNOSIS — Z825 Family history of asthma and other chronic lower respiratory diseases: Secondary | ICD-10-CM | POA: Diagnosis not present

## 2024-08-10 DIAGNOSIS — Z833 Family history of diabetes mellitus: Secondary | ICD-10-CM

## 2024-08-10 DIAGNOSIS — E119 Type 2 diabetes mellitus without complications: Secondary | ICD-10-CM | POA: Diagnosis not present

## 2024-08-10 DIAGNOSIS — Z87891 Personal history of nicotine dependence: Secondary | ICD-10-CM

## 2024-08-10 DIAGNOSIS — N281 Cyst of kidney, acquired: Secondary | ICD-10-CM | POA: Diagnosis present

## 2024-08-10 DIAGNOSIS — N2889 Other specified disorders of kidney and ureter: Principal | ICD-10-CM | POA: Diagnosis present

## 2024-08-10 DIAGNOSIS — E785 Hyperlipidemia, unspecified: Secondary | ICD-10-CM | POA: Diagnosis not present

## 2024-08-10 DIAGNOSIS — I480 Paroxysmal atrial fibrillation: Secondary | ICD-10-CM | POA: Diagnosis present

## 2024-08-10 DIAGNOSIS — K760 Fatty (change of) liver, not elsewhere classified: Secondary | ICD-10-CM | POA: Diagnosis present

## 2024-08-10 DIAGNOSIS — Z7984 Long term (current) use of oral hypoglycemic drugs: Secondary | ICD-10-CM | POA: Diagnosis not present

## 2024-08-10 DIAGNOSIS — C641 Malignant neoplasm of right kidney, except renal pelvis: Secondary | ICD-10-CM | POA: Diagnosis not present

## 2024-08-10 DIAGNOSIS — Z811 Family history of alcohol abuse and dependence: Secondary | ICD-10-CM

## 2024-08-10 DIAGNOSIS — Z794 Long term (current) use of insulin: Secondary | ICD-10-CM | POA: Diagnosis not present

## 2024-08-10 DIAGNOSIS — K2270 Barrett's esophagus without dysplasia: Secondary | ICD-10-CM | POA: Diagnosis present

## 2024-08-10 HISTORY — DX: Other intervertebral disc degeneration, thoracic region: M51.34

## 2024-08-10 HISTORY — DX: Other ill-defined heart diseases: I51.89

## 2024-08-10 HISTORY — DX: Fatty (change of) liver, not elsewhere classified: K76.0

## 2024-08-10 HISTORY — DX: Thoracic aortic ectasia: I77.810

## 2024-08-10 HISTORY — PX: ROBOT ASSISTED LAPAROSCOPIC NEPHRECTOMY: SHX5140

## 2024-08-10 HISTORY — DX: Barrett's esophagus without dysplasia: K22.70

## 2024-08-10 HISTORY — DX: Type 2 diabetes mellitus without complications: E11.9

## 2024-08-10 HISTORY — DX: Atherosclerosis of aorta: I70.0

## 2024-08-10 LAB — CBC
HCT: 42.7 % (ref 39.0–52.0)
Hemoglobin: 15.2 g/dL (ref 13.0–17.0)
MCH: 32.5 pg (ref 26.0–34.0)
MCHC: 35.6 g/dL (ref 30.0–36.0)
MCV: 91.2 fL (ref 80.0–100.0)
Platelets: 218 K/uL (ref 150–400)
RBC: 4.68 MIL/uL (ref 4.22–5.81)
RDW: 12.8 % (ref 11.5–15.5)
WBC: 10.5 K/uL (ref 4.0–10.5)
nRBC: 0 % (ref 0.0–0.2)

## 2024-08-10 LAB — GLUCOSE, CAPILLARY
Glucose-Capillary: 190 mg/dL — ABNORMAL HIGH (ref 70–99)
Glucose-Capillary: 274 mg/dL — ABNORMAL HIGH (ref 70–99)

## 2024-08-10 LAB — ABO/RH: ABO/RH(D): A POS

## 2024-08-10 LAB — CREATININE, SERUM
Creatinine, Ser: 1.39 mg/dL — ABNORMAL HIGH (ref 0.61–1.24)
GFR, Estimated: 53 mL/min — ABNORMAL LOW (ref >60)

## 2024-08-10 SURGERY — NEPHRECTOMY, RADICAL, ROBOT-ASSISTED, LAPAROSCOPIC, ADULT
Anesthesia: General | Laterality: Right

## 2024-08-10 MED ORDER — DROPERIDOL 2.5 MG/ML IJ SOLN: 0.6250 mg | Freq: Once | INTRAMUSCULAR | Status: DC | PRN

## 2024-08-10 MED ORDER — PROPOFOL 10 MG/ML IV BOLUS
INTRAVENOUS | Status: DC | PRN
  Administered 2024-08-10: 200 mg via INTRAVENOUS

## 2024-08-10 MED ORDER — SODIUM CHLORIDE 0.9 % IV SOLN: INTRAVENOUS | Status: DC

## 2024-08-10 MED ORDER — HEMOSTATIC AGENTS (NO CHARGE) OPTIME
TOPICAL | Status: DC | PRN
  Administered 2024-08-10 (×2): 1 via TOPICAL

## 2024-08-10 MED ORDER — CHLORHEXIDINE GLUCONATE 0.12 % MT SOLN
OROMUCOSAL | Status: AC
  Filled 2024-08-10: qty 15

## 2024-08-10 MED ORDER — CHLORHEXIDINE GLUCONATE 4 % EX SOLN: 60.0000 mL | Freq: Once | CUTANEOUS | Status: DC

## 2024-08-10 MED ORDER — DEXMEDETOMIDINE HCL IN NACL 80 MCG/20ML IV SOLN
INTRAVENOUS | Status: AC
  Filled 2024-08-10: qty 20

## 2024-08-10 MED ORDER — DEXMEDETOMIDINE HCL IN NACL 80 MCG/20ML IV SOLN
INTRAVENOUS | Status: DC | PRN
Start: 2024-08-10 — End: 2024-08-10
  Administered 2024-08-10: 4 ug via INTRAVENOUS

## 2024-08-10 MED ORDER — PROPOFOL 10 MG/ML IV BOLUS
INTRAVENOUS | Status: AC
  Filled 2024-08-10: qty 40

## 2024-08-10 MED ORDER — ROCURONIUM BROMIDE 10 MG/ML (PF) SYRINGE
PREFILLED_SYRINGE | INTRAVENOUS | Status: AC
  Filled 2024-08-10: qty 20

## 2024-08-10 MED ORDER — INSULIN ASPART 100 UNIT/ML IJ SOLN
8.0000 [IU] | Freq: Once | INTRAMUSCULAR | Status: AC
  Administered 2024-08-10: 8 [IU] via SUBCUTANEOUS

## 2024-08-10 MED ORDER — INSULIN ASPART 100 UNIT/ML IJ SOLN
INTRAMUSCULAR | Status: AC
Start: 2024-08-10 — End: 2024-08-10
  Filled 2024-08-10: qty 8

## 2024-08-10 MED ORDER — ACETAMINOPHEN 10 MG/ML IV SOLN
INTRAVENOUS | Status: DC | PRN
  Administered 2024-08-10: 1000 mg via INTRAVENOUS

## 2024-08-10 MED ORDER — FENTANYL CITRATE (PF) 100 MCG/2ML IJ SOLN
INTRAMUSCULAR | Status: DC | PRN
  Administered 2024-08-10 (×2): 50 ug via INTRAVENOUS

## 2024-08-10 MED ORDER — DEXTROSE-SODIUM CHLORIDE 5-0.45 % IV SOLN: INTRAVENOUS | Status: DC

## 2024-08-10 MED ORDER — PHENYLEPHRINE HCL-NACL 20-0.9 MG/250ML-% IV SOLN
INTRAVENOUS | Status: DC | PRN
Start: 2024-08-10 — End: 2024-08-10
  Administered 2024-08-10: 20 ug/min via INTRAVENOUS

## 2024-08-10 MED ORDER — 0.9 % SODIUM CHLORIDE (POUR BTL) OPTIME
TOPICAL | Status: DC | PRN
  Administered 2024-08-10: 500 mL

## 2024-08-10 MED ORDER — MIDAZOLAM HCL 2 MG/2ML IJ SOLN
INTRAMUSCULAR | Status: AC
Start: 2024-08-10 — End: 2024-08-10
  Filled 2024-08-10: qty 2

## 2024-08-10 MED ORDER — ONDANSETRON HCL 4 MG/2ML IJ SOLN: 4.0000 mg | INTRAMUSCULAR | Status: DC | PRN

## 2024-08-10 MED ORDER — OXYCODONE HCL 5 MG PO TABS: 5.0000 mg | ORAL_TABLET | ORAL | Status: DC | PRN

## 2024-08-10 MED ORDER — INSULIN ASPART 100 UNIT/ML IJ SOLN: 8.0000 [IU] | Freq: Once | INTRAMUSCULAR | Status: DC

## 2024-08-10 MED ORDER — LIDOCAINE HCL (PF) 2 % IJ SOLN
INTRAMUSCULAR | Status: AC
  Filled 2024-08-10: qty 5

## 2024-08-10 MED ORDER — HEPARIN SODIUM (PORCINE) 5000 UNIT/ML IJ SOLN
5000.0000 [IU] | Freq: Three times a day (TID) | INTRAMUSCULAR | Status: DC
  Administered 2024-08-11 (×2): 5000 [IU] via SUBCUTANEOUS
  Filled 2024-08-10 (×2): qty 1

## 2024-08-10 MED ORDER — BUPIVACAINE-EPINEPHRINE 0.25% -1:200000 IJ SOLN
INTRAMUSCULAR | Status: DC | PRN
Start: 2024-08-10 — End: 2024-08-10
  Administered 2024-08-10: 50 mL via INTRAMUSCULAR

## 2024-08-10 MED ORDER — ACETAMINOPHEN 10 MG/ML IV SOLN: 1000.0000 mg | Freq: Once | INTRAVENOUS | Status: DC | PRN

## 2024-08-10 MED ORDER — CEFAZOLIN SODIUM-DEXTROSE 2-4 GM/100ML-% IV SOLN
2.0000 g | INTRAVENOUS | Status: AC
  Administered 2024-08-10: 2 g via INTRAVENOUS

## 2024-08-10 MED ORDER — BUPIVACAINE HCL (PF) 0.25 % IJ SOLN
INTRAMUSCULAR | Status: AC
  Filled 2024-08-10: qty 30

## 2024-08-10 MED ORDER — FENTANYL CITRATE (PF) 100 MCG/2ML IJ SOLN
25.0000 ug | INTRAMUSCULAR | Status: DC | PRN
  Administered 2024-08-10 (×5): 25 ug via INTRAVENOUS

## 2024-08-10 MED ORDER — SUGAMMADEX SODIUM 200 MG/2ML IV SOLN
INTRAVENOUS | Status: DC | PRN
  Administered 2024-08-10: 200 mg via INTRAVENOUS

## 2024-08-10 MED ORDER — PHENYLEPHRINE 80 MCG/ML (10ML) SYRINGE FOR IV PUSH (FOR BLOOD PRESSURE SUPPORT)
PREFILLED_SYRINGE | INTRAVENOUS | Status: AC
  Filled 2024-08-10: qty 10

## 2024-08-10 MED ORDER — KETAMINE HCL 50 MG/5ML IJ SOSY
PREFILLED_SYRINGE | INTRAMUSCULAR | Status: AC
  Filled 2024-08-10: qty 5

## 2024-08-10 MED ORDER — CHLORHEXIDINE GLUCONATE 0.12 % MT SOLN
15.0000 mL | Freq: Once | OROMUCOSAL | Status: AC
  Administered 2024-08-10: 15 mL via OROMUCOSAL

## 2024-08-10 MED ORDER — BUPIVACAINE LIPOSOME 1.3 % IJ SUSP
INTRAMUSCULAR | Status: AC
  Filled 2024-08-10: qty 20

## 2024-08-10 MED ORDER — ALBUMIN HUMAN 5 % IV SOLN: INTRAVENOUS | Status: DC | PRN

## 2024-08-10 MED ORDER — ROCURONIUM BROMIDE 10 MG/ML (PF) SYRINGE
PREFILLED_SYRINGE | INTRAVENOUS | Status: AC
  Filled 2024-08-10: qty 10

## 2024-08-10 MED ORDER — KETAMINE HCL 50 MG/5ML IJ SOSY
PREFILLED_SYRINGE | INTRAMUSCULAR | Status: DC | PRN
  Administered 2024-08-10: 20 mg via INTRAVENOUS
  Administered 2024-08-10: 10 mg via INTRAVENOUS
  Administered 2024-08-10: 20 mg via INTRAVENOUS

## 2024-08-10 MED ORDER — DEXAMETHASONE SOD PHOSPHATE PF 10 MG/ML IJ SOLN
INTRAMUSCULAR | Status: DC | PRN
  Administered 2024-08-10: 10 mg via INTRAVENOUS

## 2024-08-10 MED ORDER — ONDANSETRON HCL 4 MG/2ML IJ SOLN
INTRAMUSCULAR | Status: DC | PRN
Start: 2024-08-10 — End: 2024-08-10
  Administered 2024-08-10: 4 mg via INTRAVENOUS

## 2024-08-10 MED ORDER — OXYCODONE HCL 5 MG PO TABS: 5.0000 mg | ORAL_TABLET | Freq: Once | ORAL | Status: DC | PRN

## 2024-08-10 MED ORDER — HEPARIN SODIUM (PORCINE) 5000 UNIT/ML IJ SOLN
INTRAMUSCULAR | Status: DC | PRN
  Administered 2024-08-10: 5000 [IU] via SUBCUTANEOUS

## 2024-08-10 MED ORDER — ACETAMINOPHEN 500 MG PO TABS
1000.0000 mg | ORAL_TABLET | Freq: Four times a day (QID) | ORAL | Status: AC
  Administered 2024-08-10 – 2024-08-11 (×4): 1000 mg via ORAL
  Filled 2024-08-10 (×4): qty 2

## 2024-08-10 MED ORDER — FENTANYL CITRATE (PF) 100 MCG/2ML IJ SOLN
INTRAMUSCULAR | Status: AC
  Filled 2024-08-10: qty 2

## 2024-08-10 MED ORDER — LIDOCAINE HCL (CARDIAC) PF 100 MG/5ML IV SOSY
PREFILLED_SYRINGE | INTRAVENOUS | Status: DC | PRN
  Administered 2024-08-10: 100 mg via INTRAVENOUS

## 2024-08-10 MED ORDER — SODIUM CHLORIDE 0.9 % IR SOLN: Status: DC | PRN

## 2024-08-10 MED ORDER — OXYCODONE HCL 5 MG/5ML PO SOLN: 5.0000 mg | Freq: Once | ORAL | Status: DC | PRN

## 2024-08-10 MED ORDER — PHENYLEPHRINE 80 MCG/ML (10ML) SYRINGE FOR IV PUSH (FOR BLOOD PRESSURE SUPPORT)
PREFILLED_SYRINGE | INTRAVENOUS | Status: DC | PRN
  Administered 2024-08-10: 80 ug via INTRAVENOUS

## 2024-08-10 MED ORDER — HEPARIN SODIUM (PORCINE) 5000 UNIT/ML IJ SOLN
INTRAMUSCULAR | Status: AC
  Filled 2024-08-10: qty 1

## 2024-08-10 MED ORDER — SURGIFLO WITH THROMBIN (HEMOSTATIC MATRIX KIT) OPTIME
TOPICAL | Status: DC | PRN
Start: 2024-08-10 — End: 2024-08-10
  Administered 2024-08-10: 1 via TOPICAL

## 2024-08-10 MED ORDER — BUPIVACAINE HCL (PF) 0.5 % IJ SOLN
INTRAMUSCULAR | Status: AC
Start: 2024-08-10 — End: 2024-08-10
  Filled 2024-08-10: qty 30

## 2024-08-10 MED ORDER — SENNOSIDES-DOCUSATE SODIUM 8.6-50 MG PO TABS
2.0000 | ORAL_TABLET | Freq: Every day | ORAL | Status: DC
  Administered 2024-08-10: 2 via ORAL
  Filled 2024-08-10: qty 2

## 2024-08-10 MED ORDER — CEFAZOLIN SODIUM-DEXTROSE 2-4 GM/100ML-% IV SOLN
INTRAVENOUS | Status: AC
  Filled 2024-08-10: qty 100

## 2024-08-10 MED ORDER — MIDAZOLAM HCL (PF) 2 MG/2ML IJ SOLN
INTRAMUSCULAR | Status: DC | PRN
  Administered 2024-08-10: 2 mg via INTRAVENOUS

## 2024-08-10 MED ORDER — ROCURONIUM BROMIDE 100 MG/10ML IV SOLN
INTRAVENOUS | Status: DC | PRN
  Administered 2024-08-10: 70 mg via INTRAVENOUS
  Administered 2024-08-10 (×3): 30 mg via INTRAVENOUS

## 2024-08-10 MED ORDER — GLYCOPYRROLATE 0.2 MG/ML IJ SOLN
INTRAMUSCULAR | Status: DC | PRN
  Administered 2024-08-10: .2 mg via INTRAVENOUS

## 2024-08-10 MED ORDER — STERILE WATER FOR IRRIGATION IR SOLN
Status: DC | PRN
Start: 2024-08-10 — End: 2024-08-10
  Administered 2024-08-10: 1000 mL

## 2024-08-10 MED ORDER — LACTATED RINGERS IV SOLN: INTRAVENOUS | Status: DC | PRN

## 2024-08-10 MED ORDER — ORAL CARE MOUTH RINSE: 15.0000 mL | Freq: Once | OROMUCOSAL | Status: AC

## 2024-08-10 MED ORDER — EPHEDRINE SULFATE-NACL 50-0.9 MG/10ML-% IV SOSY
PREFILLED_SYRINGE | INTRAVENOUS | Status: DC | PRN
  Administered 2024-08-10: 5 mg via INTRAVENOUS

## 2024-08-10 MED ORDER — ACETAMINOPHEN 10 MG/ML IV SOLN
INTRAVENOUS | Status: AC
  Filled 2024-08-10: qty 100

## 2024-08-10 MED ORDER — ALBUMIN HUMAN 5 % IV SOLN
INTRAVENOUS | Status: AC
  Filled 2024-08-10: qty 250

## 2024-08-10 SURGICAL SUPPLY — 57 items
ANCHOR TIS RET SYS 1550ML (BAG) IMPLANT
APPLICATOR ARISTA FLEXITIP XL (MISCELLANEOUS) ×1 IMPLANT
APPLICATOR SURGIFLO ENDO (HEMOSTASIS) ×1 IMPLANT
CHLORAPREP W/TINT 26 (MISCELLANEOUS) ×2 IMPLANT
CLIP LIGATING HEM O LOK PURPLE (MISCELLANEOUS) ×4 IMPLANT
COVER TIP SHEARS 8 DVNC (MISCELLANEOUS) ×1 IMPLANT
DEFOGGER SCOPE WARM SEASHARP (MISCELLANEOUS) ×2 IMPLANT
DERMABOND ADVANCED .7 DNX12 (GAUZE/BANDAGES/DRESSINGS) ×2 IMPLANT
DRAPE ARM DVNC X/XI (DISPOSABLE) ×4 IMPLANT
DRAPE COLUMN DVNC XI (DISPOSABLE) ×1 IMPLANT
DRAPE INCISE IOBAN 66X60 STRL (DRAPES) IMPLANT
DRAPE SHEET LG 3/4 BI-LAMINATE (DRAPES) ×1 IMPLANT
ELECTRODE REM PT RTRN 9FT ADLT (ELECTROSURGICAL) ×1 IMPLANT
FORCEPS BPLR 8 MD DVNC XI (FORCEP) ×1 IMPLANT
FORCEPS BPLR FENES DVNC XI (FORCEP) ×1 IMPLANT
FORCEPS PROGRASP DVNC XI (FORCEP) ×1 IMPLANT
GLOVE BIOGEL PI IND STRL 7.0 (GLOVE) ×2 IMPLANT
GLOVE BIOGEL PI IND STRL 7.5 (GLOVE) ×1 IMPLANT
GOWN STRL REUS W/ TWL LRG LVL3 (GOWN DISPOSABLE) ×4 IMPLANT
GOWN STRL REUS W/ TWL XL LVL3 (GOWN DISPOSABLE) ×1 IMPLANT
GRASPER SUT TROCAR 14GX15 (MISCELLANEOUS) ×1 IMPLANT
GRASPER TIP-UP FEN DVNC XI (INSTRUMENTS) ×1 IMPLANT
HEMOSTAT ARISTA ABSORB 1G (HEMOSTASIS) ×1 IMPLANT
HEMOSTAT SURGICEL 2X14 (HEMOSTASIS) IMPLANT
IRRIGATION STRYKERFLOW (MISCELLANEOUS) ×1 IMPLANT
IV 0.9% NACL 1000 ML (IV SOLUTION) ×1 IMPLANT
KIT PINK PAD W/HEAD ARM REST (MISCELLANEOUS) ×1 IMPLANT
LOOP VESSEL MAXI 1X406 RED (MISCELLANEOUS) ×1 IMPLANT
MANIFOLD NEPTUNE II (INSTRUMENTS) ×1 IMPLANT
NDL HYPO 22X1.5 SAFETY MO (MISCELLANEOUS) ×1 IMPLANT
NDL INSUFFLATION 14GA 120MM (NEEDLE) ×1 IMPLANT
NEEDLE HYPO 22X1.5 SAFETY MO (MISCELLANEOUS) ×1 IMPLANT
NEEDLE INSUFFLATION 14GA 120MM (NEEDLE) ×1 IMPLANT
NS IRRIG 500ML POUR BTL (IV SOLUTION) ×1 IMPLANT
OBTURATOR OPTICALSTD 8 DVNC (TROCAR) ×1 IMPLANT
PACK LAP CHOLECYSTECTOMY (MISCELLANEOUS) ×1 IMPLANT
PAD ARMBOARD POSITIONER FOAM (MISCELLANEOUS) IMPLANT
PORT ACCESS TROCAR AIRSEAL 12 (TROCAR) IMPLANT
RELOAD STAPLE 45 2.5 WHT DVNC (STAPLE) IMPLANT
SCISSORS MNPLR CVD DVNC XI (INSTRUMENTS) ×1 IMPLANT
SEAL UNIV 5-12 XI (MISCELLANEOUS) ×4 IMPLANT
SET TRI-LUMEN FLTR TB AIRSEAL (TUBING) IMPLANT
SOLUTION ELECTROSURG ANTI STCK (MISCELLANEOUS) ×1 IMPLANT
SPONGE T-LAP 4X18 ~~LOC~~+RFID (SPONGE) IMPLANT
STAPLER 45 SUREFORM DVNC (STAPLE) IMPLANT
STAPLER SKIN PROX 35W (STAPLE) ×1 IMPLANT
STRAP SAFETY 5IN WIDE (MISCELLANEOUS) ×2 IMPLANT
SURGIFLO W/THROMBIN 8M KIT (HEMOSTASIS) ×1 IMPLANT
SUT MNCRL AB 4-0 PS2 18 (SUTURE) ×2 IMPLANT
SUT PDS #1 CTX NDL (SUTURE) IMPLANT
SUT VIC AB 0 CT1 36 (SUTURE) ×2 IMPLANT
SUT VIC AB 3-0 SH 27X BRD (SUTURE) IMPLANT
SUT VICRYL 0 UR6 27IN ABS (SUTURE) IMPLANT
TAPE CLOTH 3X10 WHT NS LF (GAUZE/BANDAGES/DRESSINGS) ×2 IMPLANT
TRAY FOLEY SLVR 16FR LF STAT (SET/KITS/TRAYS/PACK) ×1 IMPLANT
TROCAR Z-THREAD FIOS 12X100MM (TROCAR) ×2 IMPLANT
WATER STERILE IRR 3000ML UROMA (IV SOLUTION) IMPLANT

## 2024-08-10 NOTE — Anesthesia Preprocedure Evaluation (Signed)
 Anesthesia Evaluation  Patient identified by MRN, date of birth, ID band Patient awake    Reviewed: Allergy & Precautions, H&P , NPO status , Patient's Chart, lab work & pertinent test results, reviewed documented beta blocker date and time   Airway Mallampati: II  TM Distance: >3 FB Neck ROM: full    Dental  (+) Teeth Intact   Pulmonary neg pulmonary ROS, former smoker   Pulmonary exam normal        Cardiovascular Exercise Tolerance: Good hypertension, On Medications negative cardio ROS Normal cardiovascular exam Rhythm:regular Rate:Normal     Neuro/Psych  Neuromuscular disease  negative psych ROS   GI/Hepatic Neg liver ROS,GERD  Medicated,,  Endo/Other  negative endocrine ROSdiabetes, Well Controlled, Type 2, Oral Hypoglycemic Agents    Renal/GU negative Renal ROS  negative genitourinary   Musculoskeletal   Abdominal   Peds  Hematology negative hematology ROS (+)   Anesthesia Other Findings Past Medical History: No date: Actinic keratosis No date: Aortic atherosclerosis No date: Arthritis     Comment:  right thumb No date: Ascending aorta dilatation No date: Barrett's esophagus No date: DDD (degenerative disc disease), thoracic No date: Diastolic dysfunction No date: Dysuria No date: GERD (gastroesophageal reflux disease) No date: Hepatic steatosis No date: Hyperlipidemia No date: Hypertension No date: Paroxysmal atrial fibrillation (HCC)     Comment:  a.) CHA2DS2VASc = 5 (age x2, HTN, vascular disease,               T2DM) as of 08/04/2024; b.) rate/rhythm maintained on               oral diltiazem ; no chronic OAC No date: Renal mass, right No date: T2DM (type 2 diabetes mellitus) (HCC) No date: Wears contact lenses Past Surgical History: 06/14/2022: BROW LIFT; Right     Comment:  Procedure: BLEPHAROPTOSIS REPAIR; RESECT EX RIGHT               DIABETIC;  Surgeon: Ashley Greig HERO, MD;  Location: [facility]                SURGERY CNTR;  Service: Ophthalmology;  Laterality:               Right; 2005: COLONOSCOPY 10/16/2016: COLONOSCOPY WITH PROPOFOL ; N/A     Comment:  Procedure: COLONOSCOPY WITH PROPOFOL ;  Surgeon: Reyes LELON Cota, MD;  Location: [facility] ENDOSCOPY;  Service:               Endoscopy;  Laterality: N/A; 12/25/2021: COLONOSCOPY WITH PROPOFOL ; N/A     Comment:  Procedure: COLONOSCOPY WITH PROPOFOL ;  Surgeon: Jinny Carmine, MD;  Location: [facility] ENDOSCOPY;  Service:               Endoscopy;  Laterality: N/A; 12/31/2022: COLONOSCOPY WITH PROPOFOL ; N/A     Comment:  Procedure: COLONOSCOPY WITH PROPOFOL ;  Surgeon: Jinny Carmine, MD;  Location: [facility] ENDOSCOPY;  Service:               Endoscopy;  Laterality: N/A; 12/31/2022: ESOPHAGOGASTRODUODENOSCOPY; N/A     Comment:  Procedure: ESOPHAGOGASTRODUODENOSCOPY (EGD);  Surgeon:               Jinny Carmine, MD;  Location: [facility] ENDOSCOPY;  Service:  Endoscopy;  Laterality: N/A; 12/13/2019: ESOPHAGOGASTRODUODENOSCOPY (EGD) WITH PROPOFOL ; N/A     Comment:  Procedure: ESOPHAGOGASTRODUODENOSCOPY (EGD) WITH BIOPSY;              Surgeon: Jinny Carmine, MD;  Location: [facility]               [facility];  Service: Endoscopy;  Laterality: N/A;  Diabetic -               oral meds No date: PLANTAR'S WART EXCISION No date: STRABISMUS SURGERY; Right BMI    Body Mass Index: 31.07 kg/m     Reproductive/Obstetrics negative OB ROS                              Anesthesia Physical Anesthesia Plan  ASA: 3  Anesthesia Plan: General ETT   Post-op Pain Management:    Induction:   PONV Risk Score and Plan: 3  Airway Management Planned:   Additional Equipment:   Intra-op Plan:   Post-operative Plan:   Informed Consent: I have reviewed the patients History and Physical, chart, labs and discussed the procedure including the risks, benefits and alternatives for the proposed  anesthesia with the patient or authorized representative who has indicated his/her understanding and acceptance.     Dental Advisory Given  Plan Discussed with: CRNA  Anesthesia Plan Comments:         Anesthesia Quick Evaluation

## 2024-08-10 NOTE — Progress Notes (Signed)
 No SCD pumps in PACU.  Charge Nurse called portable dept and requested.

## 2024-08-10 NOTE — Op Note (Signed)
 Date of procedure: 08/10/24  Preoperative diagnosis:  Right renal mass   Postoperative diagnosis:  Right renal mass  Procedure: Right robotic radical nephrectomy  Surgeon: Penne Skye, MD  Assistant: Redell Burnet, MD - necessary for critical portions of the procedure in the absence of a qualified assistant.   Anesthesia: General  Complications: None  Intraoperative findings:  Successful Right robotic radical nephrectomy, adrenal sparing  EBL: 0  Specimens: Right kidney and proximal ureter  Drains: None  Indication: Steven Miranda is a 78 y.o. patient with a 6.5 cm Right Bosniak III renal cyst.  After reviewing the management options for treatment, they elected to proceed with the above surgical procedure(s). We have discussed the potential benefits and risks of the procedure, side effects of the proposed treatment, the likelihood of the patient achieving the goals of the procedure, and any potential problems that might occur during the procedure or recuperation. Informed consent has been obtained.  Description of procedure:  Following successful induction, patient was prepped and draped in usual standard fashion.  IV antibiotics were administered.  Patient was positioned in the modified right flank supine position.  A final presurgical timeout was performed with all team members present.   We began with Veress needle entry in the right upper quadrant.  Pneumoperitoneum of 15 mmHg achieved.  We then placed a 8 mm robotic trocar followed by the robotic camera.  The abdomen was inspected and appeared to be free of injury.  Next we placed the remainder of our robotic ports under direct visualization.  A 12 mm robotic port was placed for our future left arm, required for sure form stapling.  Robotic ports followed a soft J towards the right ASIS.  We marked a tentative location for a liver retractor port if needed.  Next the robot was docked and all instruments inserted.   We began  by reflecting the white line of Toldt and entering the retroperitoneum.  Small bowel was reflected including the duodenum.  We identified the right ureter and the right gonadal vessels moving caudally.  We entered into a plane on top of the psoas muscle and elevated the kidney.  Next we moved cephalad creating packets towards the renal hilum.  1 main renal artery and 1 renal vein were visualized.  These were circumferentially dissected.  We stapled the renal vessels independently with 45 mm vascular loads using the sure form stapler.  Hemostasis was excellent.  Next we continued our dissection around the superior pole of the right kidney, releasing hepatorenal attachments.  The right adrenal gland was spared. Following complete dissection laterally, we concluded with clipping and division of the ureter.   The specimen was placed in a 15 mm Endo Catch bag.  The right renal fossa was inspected and hemostasis was noted to be adequate.  Surgiflo and Arista were placed along the hilar staple line. The robot was undocked and all instruments were removed.    We connected our RLQ port incisions to create a Baldwin style extraction. The Right kidney and proximal ureter specimen w/ bag were extracted and sent for pathology. The right renal fossa was again inspected and found to be hemostatic. The RLQ extraction site was closed in multiple layers: 0-vicryl for peritoneum, 0-PDS for the internal and external oblique fascia, 3-0 vicryl for Scarpas and deep dermals, followed by 4-0 monocryl and Dermabond. The 12mm supraumbilical assistant port was closed with pre-placed 0-vicryl stitches from our initial Carter-Thomason. All other port sites closed with 4-0 monocryl  and Dermabond.   Disposition: Stable to PACU  Plan:   Penne Skye, MD

## 2024-08-10 NOTE — Progress Notes (Signed)
 Post op check   Patient in overnight PACU room, resting comfortable, wife at bedside Mild to moderate RLQ discomfort, at extraction incision  Otherwise, recovering well Incisions c/d/I, foley in place   Recommend up to chair, ambulation this evening ADAT, Dc foley in AM, expect dc tomorrow

## 2024-08-10 NOTE — Interval H&P Note (Signed)
 History and Physical Interval Note:  08/10/2024 7:11 AM  Steven Miranda  has presented today for surgery, with the diagnosis of Right Kidney Mass.  The various methods of treatment have been discussed with the patient and family. After consideration of risks, benefits and other options for treatment, the patient has consented to  Procedure(s): NEPHRECTOMY, RADICAL, ROBOT-ASSISTED, LAPAROSCOPIC, ADULT (Right) as a surgical intervention.  The patient's history has been reviewed, patient examined, no change in status, stable for surgery.  I have reviewed the patient's chart and labs.  Questions were answered to the patient's satisfaction.    Cardiac: RRR Lungs: CTA bilaterally  Laterality: Right Procedure: Right robotic radical nephrectomy  Informed consent obtained, we specifically discussed the risks of bleeding, infection, post-operative pain, need for additional procedures. Operative site appropriately marked where indicated  [name], MD 08/10/2024    [name]

## 2024-08-10 NOTE — Plan of Care (Signed)
  Problem: Education: Goal: Knowledge of General Education information will improve Description: Including pain rating scale, medication(s)/side effects and non-pharmacologic comfort measures Outcome: Progressing   Problem: Health Behavior/Discharge Planning: Goal: Ability to manage health-related needs will improve Outcome: Progressing   Problem: Clinical Measurements: Goal: Ability to maintain clinical measurements within normal limits will improve Outcome: Progressing Goal: Will remain free from infection Outcome: Progressing Goal: Diagnostic test results will improve Outcome: Progressing Goal: Respiratory complications will improve Outcome: Progressing Goal: Cardiovascular complication will be avoided Outcome: Progressing   Problem: Activity: Goal: Risk for activity intolerance will decrease Outcome: Progressing   Problem: Nutrition: Goal: Adequate nutrition will be maintained Outcome: Progressing   Problem: Coping: Goal: Level of anxiety will decrease Outcome: Progressing   Problem: Elimination: Goal: Will not experience complications related to bowel motility Outcome: Progressing Goal: Will not experience complications related to urinary retention Outcome: Progressing   Problem: Safety: Goal: Ability to remain free from injury will improve Outcome: Progressing   Problem: Pain Managment: Goal: General experience of comfort will improve and/or be controlled Outcome: Progressing   Problem: Skin Integrity: Goal: Risk for impaired skin integrity will decrease Outcome: Progressing   Problem: Education: Goal: Knowledge of the prescribed therapeutic regimen will improve Outcome: Progressing   Problem: Bowel/Gastric: Goal: Gastrointestinal status for postoperative course will improve Outcome: Progressing   Problem: Clinical Measurements: Goal: Postoperative complications will be avoided or minimized Outcome: Progressing   Problem: Respiratory: Goal:  Ability to achieve and maintain a regular respiratory rate will improve Outcome: Progressing   Problem: Skin Integrity: Goal: Demonstration of wound healing without infection will improve Outcome: Progressing   Problem: Urinary Elimination: Goal: Ability to avoid or minimize complications of infection will improve Outcome: Progressing Goal: Ability to achieve and maintain urine output will improve Outcome: Progressing

## 2024-08-10 NOTE — Anesthesia Postprocedure Evaluation (Signed)
 Anesthesia Post Note  Patient: Steven Miranda  Procedure(s) Performed: NEPHRECTOMY, RADICAL, ROBOT-ASSISTED, LAPAROSCOPIC, ADULT (Right)  Patient location during evaluation: PACU Anesthesia Type: General Level of consciousness: awake and alert Pain management: pain level controlled Vital Signs Assessment: post-procedure vital signs reviewed and stable Respiratory status: spontaneous breathing, nonlabored ventilation and respiratory function stable Cardiovascular status: blood pressure returned to baseline and stable Postop Assessment: no apparent nausea or vomiting Anesthetic complications: no   No notable events documented.   Last Vitals:  Vitals:   08/10/24 1345 08/10/24 1400  BP:    Pulse: 74 80  Resp: 12 12  Temp:  36.4 C  SpO2: 98% 91%    Last Pain:  Vitals:   08/10/24 1345  TempSrc:   PainSc: Asleep                 Camellia Merilee Louder

## 2024-08-10 NOTE — Plan of Care (Signed)
  Problem: Pain Managment: Goal: General experience of comfort will improve and/or be controlled Outcome: Progressing   Problem: Safety: Goal: Ability to remain free from injury will improve Outcome: Progressing   Problem: Skin Integrity: Goal: Risk for impaired skin integrity will decrease Outcome: Progressing

## 2024-08-10 NOTE — Transfer of Care (Signed)
 Immediate Anesthesia Transfer of Care Note  Patient: Steven Miranda  Procedure(s) Performed: NEPHRECTOMY, RADICAL, ROBOT-ASSISTED, LAPAROSCOPIC, ADULT (Right)  Patient Location: PACU  Anesthesia Type:General  Level of Consciousness: drowsy  Airway & Oxygen Therapy: Patient Spontanous Breathing and Patient connected to face mask oxygen  Post-op Assessment: Report given to RN and Post -op Vital signs reviewed and stable  Post vital signs: Reviewed and stable  Last Vitals:  Vitals Value Taken Time  BP 146/80 08/10/24 10:52  Temp    Pulse 68 08/10/24 10:54  Resp 15 08/10/24 10:54  SpO2 96 % 08/10/24 10:54  Vitals shown include unfiled device data.  Last Pain:  Vitals:   08/10/24 0626  TempSrc: Oral  PainSc: 0-No pain         Complications: No notable events documented.

## 2024-08-10 NOTE — Anesthesia Procedure Notes (Signed)
 Procedure Name: Intubation Date/Time: 08/10/2024 7:47 AM  Performed by: Karilyn Wind, CRNAPre-anesthesia Checklist: Patient identified, Emergency Drugs available, Suction available and Patient being monitored Patient Re-evaluated:Patient Re-evaluated prior to induction Oxygen Delivery Method: Circle System Utilized Preoxygenation: Pre-oxygenation with 100% oxygen Induction Type: IV induction Ventilation: Mask ventilation without difficulty and Oral airway inserted - appropriate to patient size Laryngoscope Size: 4 and McGrath Grade View: Grade I Tube type: Oral Tube size: 7.5 mm Number of attempts: 1 Airway Equipment and Method: Stylet and Oral airway Placement Confirmation: ETT inserted through vocal cords under direct vision, positive ETCO2 and breath sounds checked- equal and bilateral Secured at: 23 cm Tube secured with: Tape Dental Injury: Teeth and Oropharynx as per pre-operative assessment  Comments: Head and neck midline. Lips, teeth, and chin free from external pressure.

## 2024-08-11 ENCOUNTER — Encounter: Payer: Self-pay | Admitting: Urology

## 2024-08-11 LAB — BASIC METABOLIC PANEL WITH GFR
Anion gap: 9 (ref 5–15)
BUN: 15 mg/dL (ref 8–23)
CO2: 24 mmol/L (ref 22–32)
Calcium: 8.4 mg/dL — ABNORMAL LOW (ref 8.9–10.3)
Chloride: 99 mmol/L (ref 98–111)
Creatinine, Ser: 1.38 mg/dL — ABNORMAL HIGH (ref 0.61–1.24)
GFR, Estimated: 53 mL/min — ABNORMAL LOW (ref >60)
Glucose, Bld: 243 mg/dL — ABNORMAL HIGH (ref 70–99)
Potassium: 4.2 mmol/L (ref 3.5–5.1)
Sodium: 132 mmol/L — ABNORMAL LOW (ref 135–145)

## 2024-08-11 LAB — CBC
HCT: 40.6 % (ref 39.0–52.0)
Hemoglobin: 14.2 g/dL (ref 13.0–17.0)
MCH: 32.3 pg (ref 26.0–34.0)
MCHC: 35 g/dL (ref 30.0–36.0)
MCV: 92.5 fL (ref 80.0–100.0)
Platelets: 225 K/uL (ref 150–400)
RBC: 4.39 MIL/uL (ref 4.22–5.81)
RDW: 12.5 % (ref 11.5–15.5)
WBC: 8.8 K/uL (ref 4.0–10.5)
nRBC: 0 % (ref 0.0–0.2)

## 2024-08-11 MED ORDER — DOCUSATE SODIUM 100 MG PO CAPS: 100.0000 mg | ORAL_CAPSULE | Freq: Two times a day (BID) | ORAL | 0 refills | Status: AC

## 2024-08-11 MED ORDER — OXYCODONE-ACETAMINOPHEN 5-325 MG PO TABS: 1.0000 | ORAL_TABLET | Freq: Four times a day (QID) | ORAL | 0 refills | Status: AC | PRN

## 2024-08-11 NOTE — Progress Notes (Signed)
 DISCHARGE NOTE:   Pt dc with IV removed and dc instructions given. Pt voices no questions or concerns at this time. Pt wheeled down to medical mall entrance by staff. Pt's wife provided transportation.

## 2024-08-11 NOTE — Discharge Summary (Signed)
 Date of admission: 08/10/2024  Date of discharge: 08/11/2024  Admission diagnosis: Right renal mass  Discharge diagnosis: Same as above  Secondary diagnoses:  Patient Active Problem List   Diagnosis Date Noted   Renal mass, right 08/10/2024   Renal mass 06/18/2024   Barrett's esophagus without dysplasia 12/31/2022   Polyp of descending colon 12/31/2022   History of colonic polyps    Polyp of ascending colon    Indigestion    Other specified disease of esophagus    Carpal tunnel syndrome 02/12/2018   Shoulder stiffness 11/25/2017   Shoulder joint pain 06/06/2017   Acid indigestion 02/02/2015   Essential (primary) hypertension 02/02/2015   Injury of tendon of rotator cuff 02/02/2015   Diabetes mellitus, type 2 (HCC) 02/02/2015   HLD (hyperlipidemia) 05/19/2007   History and Physical: For full details, please see admission history and physical. Briefly, Steven Miranda is a 78 y.o. year old patient admitted on 08/10/2024 for scheduled robotic right radical nephrectomy with Dr. Georganne for management of a large right Bosniak 3 renal cyst.   A.m. labs with stable hemoglobin, 14.2; stable white count, 8.8; and increased creatinine over baseline, 1.38.  This morning he reports feeling well. He has some abdominal soreness, which he understands is normal. He was able to tolerate breakfast without nausea or vomiting. Foley removed shortly before my arrival; he has not yet voided.  As of this afternoon, he has ambulated and pain remained well-controlled.  He has voided without difficulty.  Physical Exam: Constitutional:  Alert and oriented, no acute distress, nontoxic appearing HEENT: [place], AT Cardiovascular: No clubbing, cyanosis, or edema Respiratory: Normal respiratory effort, no increased work of breathing GI: Abdomen is soft with appropriate postoperative tenderness, no rigidity or rebound. Incisions clean/dry/intact with overlying surgical adhesive. GU: No right flank  ecchymosis Skin: No rashes, bruises or suspicious lesions Neurologic: Grossly intact, no focal deficits, moving all 4 extremities Psychiatric: Normal mood and affect   Hospital Course: Patient tolerated the procedure well.  He was then transferred to the floor after an uneventful PACU stay.  His hospital course was uncomplicated.  On POD#1 he had met discharge criteria: was eating a regular diet, was up and ambulating independently,  pain was well controlled, was voiding without a catheter, and was ready for discharge.  Laboratory values:  Recent Labs    08/10/24 2142 08/11/24 0600  WBC 10.5 8.8  HGB 15.2 14.2  HCT 42.7 40.6   Recent Labs    08/10/24 2142 08/11/24 0600  NA  --  132*  K  --  4.2  CL  --  99  CO2  --  24  GLUCOSE  --  243*  BUN  --  15  CREATININE 1.39* 1.38*  CALCIUM   --  8.4*   Results for orders placed or performed during the hospital encounter of 08/04/24  Urine Culture     Status: None   Collection Time: 08/04/24  9:31 AM   Specimen: Urine, Clean Catch  Result Value Ref Range Status   Specimen Description   Final    URINE, CLEAN CATCH Performed at [facility], [address], [place], [place] [address]    Special Requests   Final    NONE Performed at [facility], [address], [place], [place] [address]    Culture   Final    NO GROWTH Performed at [facility], [address], [place], [place] [address]    Report Status 08/06/2024 FINAL  Final   Disposition: Home  Discharge instruction: The patient was instructed to be ambulatory but told to refrain from heavy lifting, strenuous activity, or driving.   Discharge medications:  Allergies as of 08/11/2024   No Known Allergies      Medication List     TAKE these medications    Accu-Chek Nano SmartView w/Device Kit Use glucometer (Accu-Chek Nano Glucometer) to test fasting glucose daily.   atorvastatin  20 MG tablet Commonly known as: LIPITOR Take 1 tablet  (20 mg total) by mouth daily.   diltiazem  120 MG 24 hr capsule Commonly known as: Cardizem  CD Take 1 capsule (120 mg total) by mouth daily.   docusate sodium 100 MG capsule Commonly known as: Colace Take 1 capsule (100 mg total) by mouth 2 (two) times daily.   famotidine  40 MG tablet Commonly known as: PEPCID  Take 1 tablet (40 mg total) by mouth at bedtime.   fluticasone  50 MCG/ACT nasal spray Commonly known as: FLONASE  Place 1 spray into both nostrils daily as needed for allergies.   hyoscyamine  0.125 MG SL tablet Commonly known as: LEVSIN  SL Place 0.125 mg under the tongue 2 (two) times daily as needed for cramping.   Jardiance  25 MG Tabs tablet Generic drug: empagliflozin  Take 25 mg by mouth.   losartan 50 MG tablet Commonly known as: COZAAR Take 50 mg by mouth daily.   metFORMIN  1000 MG tablet Commonly known as: GLUCOPHAGE  Take 1 tablet (1,000 mg total) by mouth 2 (two) times daily with a meal. What changed:  how much to take when to take this   onetouch ultrasoft lancets Use as instructed once a day for fasting glucose check.   Osteo Bi-Flex Adv Double St Caps Take 1 capsule by mouth daily.   oxyCODONE-acetaminophen  5-325 MG tablet Commonly known as: Percocet Take 1 tablet by mouth every 6 (six) hours as needed for up to 5 days for severe pain (pain score 7-10).   pantoprazole  40 MG tablet Commonly known as: PROTONIX  Take 1 tablet (40 mg total) by mouth daily.   pioglitazone  15 MG tablet Commonly known as: ACTOS  Take 1 tablet (15 mg total) by mouth daily.   Probiotic Formula Caps Take 1 capsule by mouth daily.   sucralfate  1 g tablet Commonly known as: CARAFATE  TAKE 1 TABLET FOUR TIMES DAILY - WITH MEALS AND AT BEDTIME. What changed: See the new instructions.   True Metrix Blood Glucose Test test strip Generic drug: glucose blood Use as instructed        Followup:   Follow-up Information     Dr. Georganne. Go on 08/20/2024.   Why: For  postop follow-up Contact information: [facility] [place] [address] [place],  [place]  [address]

## 2024-08-11 NOTE — Plan of Care (Signed)
  Problem: Education: Goal: Knowledge of General Education information will improve Description: Including pain rating scale, medication(s)/side effects and non-pharmacologic comfort measures Outcome: Progressing   Problem: Health Behavior/Discharge Planning: Goal: Ability to manage health-related needs will improve Outcome: Progressing   Problem: Clinical Measurements: Goal: Ability to maintain clinical measurements within normal limits will improve Outcome: Progressing   Problem: Nutrition: Goal: Adequate nutrition will be maintained Outcome: Progressing   Problem: Coping: Goal: Level of anxiety will decrease Outcome: Progressing   Problem: Pain Managment: Goal: General experience of comfort will improve and/or be controlled Outcome: Progressing

## 2024-08-11 NOTE — Progress Notes (Signed)
 Pt's blood pressure 179/89 and HR 60. PA Samantha made aware. Okay to dc and start home BP medications when he arrives home.

## 2024-08-16 DIAGNOSIS — C649 Malignant neoplasm of unspecified kidney, except renal pelvis: Secondary | ICD-10-CM | POA: Insufficient documentation

## 2024-08-16 LAB — SURGICAL PATHOLOGY

## 2024-08-16 NOTE — Assessment & Plan Note (Addendum)
 S/p Right bot radical nephrectomy on 08/10/24  - path = pT3a ccRCC, grade 2, -sm   ~2 weeks post op Overall doing well, recovering appropriately. Reviewed pathology   Post-Nephrectomy Surveillance (High-Risk RCC, pT2-4 or N1, per NCCN):  - H&P + CMP/UA: q50mo  3 yrs ? annually until yr 5 ? as clinically indicated / PRN  - Abdominal imaging (CT/MRI preferred): baseline at 3-6 mo, then q3-23mo  3 yrs ? annually until yr 5 ? as clinically indicated / PRN  - Chest imaging (CT preferred): q3-15mo  3 yrs ? annually until yr 5 ? as clinically indicated / PRN   - CT C/A/P + BMP in ~4 months (~Mar 2026) with return visit - Reviewed the evidence for adjuvant Pembro for high-risk RCC. I think he would be a reasonable candidate. Will refer to Dr. Babara in medical Oncology - Referral to nephrology for solitary kidney

## 2024-08-16 NOTE — Progress Notes (Signed)
   08/20/2024 10:09 AM   Steven Miranda 1946/07/18 [id number]  Reason for visit: Follow up Right nephrectomy   HPI: 78 y.o. male, follow up with me today  S/p Right bot radical nephrectomy on 08/10/24  - path = pT3a ccRCC, grade 2, -sm   Overall doing well, recovering appropriately Energy levels low to fair, but improving Appetite fair, eating well, normal bowel movements RLQ extraction incision still uncomfortable, but improving  Prior HPI: 6.5 cm Right exophytic complex cyst, Bosniak III Multiloculated, internal septations w/ enhancement   Patient remains in excellent health Exercise daily, consistent aerobics and cycling No prior abdominal surgeries    Physical Exam: BP 117/72   Pulse 65   Ht 6' 2 (1.88 m)   Wt 242 lb (109.8 kg)   BMI 31.07 kg/m    Constitutional:  Alert and oriented, No acute distress. Abd: port incisiosn c/d/I. RLQ extraction incision c/d/I, minor incisional edema, no erythema, minor expected bruising. Abdomen soft and nondistended  Laboratory Data: FINAL DIAGNOSIS        1. Kidney, radical nephrectomy for tumor, right kidney :       RENAL CELL CARCINOMA, CLEAR-CELL TYPE, MULTICYSTIC, 4.5 CM.       TUMOR FOCALLY EXTENDS INTO RENAL SINUS ADIPOSE TISSUE.       NEGATIVE FOR LYMPHOVASCULAR INVASION.       ALL SURGICAL MARGINS NEGATIVE FOR TUMOR.       SEE ONCOLOGY TABLE AND COMMENT.   Pertinent Imaging: N/A    Assessment & Plan:    Renal cell carcinoma of right kidney [facility]) Assessment & Plan: S/p Right bot radical nephrectomy on 08/10/24  - path = pT3a ccRCC, grade 2, -sm   ~2 weeks post op Overall doing well, recovering appropriately. Reviewed pathology   Post-Nephrectomy Surveillance (High-Risk RCC, pT2-4 or N1, per NCCN):  - H&P + CMP/UA: q24mo  3 yrs ? annually until yr 5 ? as clinically indicated / PRN  - Abdominal imaging (CT/MRI preferred): baseline at 3-6 mo, then q3-28mo  3 yrs ? annually until yr 5 ? as clinically  indicated / PRN  - Chest imaging (CT preferred): q3-68mo  3 yrs ? annually until yr 5 ? as clinically indicated / PRN   - CT C/A/P + BMP in ~4 months (~Mar 2026) with return visit - Reviewed the evidence for adjuvant Pembro for high-risk RCC. I think he would be a reasonable candidate. Will refer to Dr. Babara in medical Oncology - Referral to nephrology for solitary kidney   Orders: -     Ambulatory referral to Hematology / Oncology -     CT CHEST ABDOMEN PELVIS WO CONTRAST; Future       [name], MD  [facility] [address], [address] [place], [place] [address] [phone]

## 2024-08-20 ENCOUNTER — Ambulatory Visit (INDEPENDENT_AMBULATORY_CARE_PROVIDER_SITE_OTHER): Admitting: Urology

## 2024-08-20 VITALS — BP 117/72 | HR 65 | Ht 74.0 in | Wt 242.0 lb

## 2024-08-20 DIAGNOSIS — C641 Malignant neoplasm of right kidney, except renal pelvis: Secondary | ICD-10-CM

## 2024-08-30 ENCOUNTER — Inpatient Hospital Stay

## 2024-08-30 ENCOUNTER — Inpatient Hospital Stay: Admitting: Oncology

## 2024-08-31 ENCOUNTER — Inpatient Hospital Stay: Attending: Oncology | Admitting: Oncology

## 2024-08-31 ENCOUNTER — Inpatient Hospital Stay

## 2024-08-31 ENCOUNTER — Encounter: Payer: Self-pay | Admitting: Oncology

## 2024-08-31 VITALS — BP 127/74 | HR 63 | Temp 97.0°F | Resp 18 | Wt 245.6 lb

## 2024-08-31 DIAGNOSIS — R5383 Other fatigue: Secondary | ICD-10-CM | POA: Insufficient documentation

## 2024-08-31 DIAGNOSIS — Z905 Acquired absence of kidney: Secondary | ICD-10-CM | POA: Diagnosis not present

## 2024-08-31 DIAGNOSIS — N183 Chronic kidney disease, stage 3 unspecified: Secondary | ICD-10-CM | POA: Diagnosis not present

## 2024-08-31 DIAGNOSIS — Z87891 Personal history of nicotine dependence: Secondary | ICD-10-CM | POA: Diagnosis not present

## 2024-08-31 DIAGNOSIS — Z803 Family history of malignant neoplasm of breast: Secondary | ICD-10-CM | POA: Insufficient documentation

## 2024-08-31 DIAGNOSIS — Z5112 Encounter for antineoplastic immunotherapy: Secondary | ICD-10-CM | POA: Insufficient documentation

## 2024-08-31 DIAGNOSIS — Z79620 Long term (current) use of immunosuppressive biologic: Secondary | ICD-10-CM | POA: Insufficient documentation

## 2024-08-31 DIAGNOSIS — C641 Malignant neoplasm of right kidney, except renal pelvis: Secondary | ICD-10-CM | POA: Insufficient documentation

## 2024-08-31 MED ORDER — PROCHLORPERAZINE MALEATE 10 MG PO TABS: 10.0000 mg | ORAL_TABLET | Freq: Four times a day (QID) | ORAL | 1 refills | Status: AC | PRN

## 2024-08-31 NOTE — Progress Notes (Signed)
 Hematology/Oncology Consult Note [phone] [phone]     REFERRING PROVIDER: Bertrum Charlie CROME, MD    CHIEF COMPLAINTS/PURPOSE OF CONSULTATION:  Right RCC  ASSESSMENT & PLAN:   Renal cell carcinoma (HCC) pT3a pNx stage III clear-cell RCC, G2, status post right radical nephrectomy. UISS prognostic model intermediate-high risk I recommend 1 year of pembrolizumab every 3 weeks [17 cycles] based on data from phase 3 clinical trial Keynote 564.   Rationale and potential side effects were reviewed and discussed with patient. I discussed the mechanism of action and rationale of using immunotherapy.   Discussed the potential side effects of immunotherapy including but not limited to colitis/diarrhea; skin rash; pneumonitis/respiratory failure, nephritis, muscle/joint pain, elevated LFTs/liver failure,endocrine abnormalities, neuropathy, etc. Patient voices understanding and agrees with proceeding with treatment.   Will arrange patient to get treatment because and anticipate to start in the next 1-2 weeks   Orders Placed This Encounter  Procedures   Consent Attestation for Oncology Treatment    The patient is informed of risks, benefits, side-effects of the prescribed oncology treatment. Potential short term and long term side effects and response rates discussed. After a long discussion, the patient made informed decision to proceed.:   Yes   CBC with Differential (Cancer Center Only)    Standing Status:   Future    Expected Date:   09/14/2024    Expiration Date:   09/14/2025   CMP (Cancer Center only)    Standing Status:   Future    Expected Date:   09/14/2024    Expiration Date:   09/14/2025   T4    Standing Status:   Future    Expected Date:   09/14/2024    Expiration Date:   09/14/2025   TSH    Standing Status:   Future    Expected Date:   09/14/2024    Expiration Date:   09/14/2025   PHYSICIAN COMMUNICATION ORDER    Required labs: Thyroid  function  tests (TSH/T4) at baseline and every 3rd cycle.   ONCBCN PHYSICIAN COMMUNICATION 1    Treat until Progression, Unacceptable Toxicity, or up to 24 Months.   Follow-up to be determined.  Anticipate to start adjuvant treatment in 1 to 2 weeks All questions were answered. The patient knows to call the clinic with any problems, questions or concerns.  Zelphia Cap, MD, PhD [facility] Hematology Oncology 08/31/2024    HISTORY OF PRESENTING ILLNESS:  Steven Miranda 78 y.o. male presents to establish care for right RCC I have reviewed his chart and materials related to his cancer extensively and collaborated history with the patient. Summary of oncologic history is as follows: Oncology History  Renal cell carcinoma (HCC)  04/01/2024 Imaging   Complete abdomen ultrasound showed 1. No acute abnormality. 2. Hepatic steatosis. 3. Complex right renal cyst measuring up to 7.0 cm. Further evaluation with nonemergent renal protocol CT or MRI with and without IV contrast is recommended.   05/18/2024 Imaging   MRI abdomen with and without contrast showed 1. 6.4 cm complex septated cystic lesion medially in the upper pole of the right kidney with thin enhancing septations in the superior component. This is consistent with an indeterminate cystic renal mass, best classified as Bosniak III. Urology consultation recommended. 2. Additional simple cyst in the interpolar region of the right kidney (Bosniak 1). No suspicious left renal lesions. 3. No adenopathy. 4. No acute abdominal findings   08/10/2024 Surgery   Patient underwent right radical nephrectomy  Pathology  showed 1. Kidney, radical nephrectomy for tumor, right kidney :       RENAL CELL CARCINOMA, CLEAR-CELL TYPE, MULTICYSTIC, 4.5 CM.       TUMOR FOCALLY EXTENDS INTO RENAL SINUS ADIPOSE TISSUE.       NEGATIVE FOR LYMPHOVASCULAR INVASION.       ALL SURGICAL MARGINS NEGATIVE FOR TUMOR.       SEE ONCOLOGY TABLE AND COMMENT   KIDNEY:  Nephrectomy Procedure: Right nephrectomy Specimen Laterality: Right Tumor Size: 4.5 x 4 x 3 cm Tumor Focality: Unifocal Histologic Type: Multicystic clear-cell renal cell carcinoma Sarcomatoid Features: Not identified Rhabdoid Features: Not identified Histologic Grade: WHO grade 2 Tumor Necrosis: Minimal Tumor Extension: Into renal sinus adipose tissue Lymphatic and/or Vascular Invasion: Not identified Margins: All surgical margins negative for tumor Regional Lymph Nodes: No lymph nodes submitted Distant Metastasis: Distant Site(s) Involved: Not applicable Additional Findings in Nonneoplastic Kidney: No significant findings Pathologic Stage Classification (pTNM, AJCC 8th Edition): pT3a, pN not assigned Ancillary Studies: Immunohistochemistry, see comment Representative Tumor Block: 1D-1J Comment(s): The tumor is predominantly cystic with cyst lined by cells with clear cytoplasm. Immunohistochemistry shows the tumor cells are positive with CA-IX and cytokeratin 7 with mostly negative staining with CD10 and P504S. The morphologic features are consistent with clear-cell type renal cell carcinoma predominantly cystic which focally extends into renal sinus adipose tissue.   08/16/2024 Initial Diagnosis   Renal cell carcinoma (HCC)   08/31/2024 Cancer Staging   Staging form: Kidney, AJCC 8th Edition - Pathologic stage from 08/31/2024: Stage III (pT3a, pN0, cM0) - Signed by Babara Call, MD on 08/31/2024 Stage prefix: Initial diagnosis    Patient was accompanied by his spouse. He recovers well from recent nephrectomy surgery.  Appetite is fair.  He has some mild discomfort around surgical site, improving.  Denies any unintentional weight loss, night sweats fever or chills.  Patient denies smoking.  Family history of mother with breast cancer. Mild fatigue  Patient denies any history of autoimmune disease.  Has a history of Barrett's esophagus, diabetes, hypertension, colon polyp.    MEDICAL HISTORY:  Past  Medical History:  Diagnosis Date   Actinic keratosis    Aortic atherosclerosis    Arthritis    right thumb   Ascending aorta dilatation    Barrett's esophagus    DDD (degenerative disc disease), thoracic    Diastolic dysfunction    Dysuria    GERD (gastroesophageal reflux disease)    Hepatic steatosis    Hyperlipidemia    Hypertension    Paroxysmal atrial fibrillation (HCC)    a.) CHA2DS2VASc = 5 (age x2, HTN, vascular disease, T2DM) as of 08/04/2024; b.) rate/rhythm maintained on oral diltiazem ; no chronic OAC   Renal mass, right    T2DM (type 2 diabetes mellitus) (HCC)    Wears contact lenses     SURGICAL HISTORY: Past Surgical History:  Procedure Laterality Date   BROW LIFT Right 06/14/2022   Procedure: BLEPHAROPTOSIS REPAIR; RESECT EX RIGHT DIABETIC;  Surgeon: Ashley Greig HERO, MD;  Location: [facility];  Service: Ophthalmology;  Laterality: Right;   COLONOSCOPY  2005   COLONOSCOPY WITH PROPOFOL  N/A 10/16/2016   Procedure: COLONOSCOPY WITH PROPOFOL ;  Surgeon: Reyes LELON Cota, MD;  Location: [facility] ENDOSCOPY;  Service: Endoscopy;  Laterality: N/A;   COLONOSCOPY WITH PROPOFOL  N/A 12/25/2021   Procedure: COLONOSCOPY WITH PROPOFOL ;  Surgeon: Jinny Carmine, MD;  Location: [facility] ENDOSCOPY;  Service: Endoscopy;  Laterality: N/A;   COLONOSCOPY WITH PROPOFOL  N/A 12/31/2022   Procedure:  COLONOSCOPY WITH PROPOFOL ;  Surgeon: Jinny Carmine, MD;  Location: [facility] ENDOSCOPY;  Service: Endoscopy;  Laterality: N/A;   ESOPHAGOGASTRODUODENOSCOPY N/A 12/31/2022   Procedure: ESOPHAGOGASTRODUODENOSCOPY (EGD);  Surgeon: Jinny Carmine, MD;  Location: [facility] ENDOSCOPY;  Service: Endoscopy;  Laterality: N/A;   ESOPHAGOGASTRODUODENOSCOPY (EGD) WITH PROPOFOL  N/A 12/13/2019   Procedure: ESOPHAGOGASTRODUODENOSCOPY (EGD) WITH BIOPSY;  Surgeon: Jinny Carmine, MD;  Location: [facility];  Service: Endoscopy;  Laterality: N/A;  Diabetic - oral meds   PLANTAR'S WART EXCISION     ROBOT ASSISTED  LAPAROSCOPIC NEPHRECTOMY Right 08/10/2024   Procedure: NEPHRECTOMY, RADICAL, ROBOT-ASSISTED, LAPAROSCOPIC, ADULT;  Surgeon: Georganne Penne SAUNDERS, MD;  Location: [facility] ORS;  Service: Urology;  Laterality: Right;   STRABISMUS SURGERY Right     SOCIAL HISTORY: Social History   Socioeconomic History   Marital status: Married    Spouse name: Not on file   Number of children: 3   Years of education: Not on file   Highest education level: Bachelor's degree (e.g., BA, AB, BS)  Occupational History   Not on file  Tobacco Use   Smoking status: Former    Current packs/day: 0.00    Types: Cigarettes    Quit date: 1978    Years since quitting: 47.9   Smokeless tobacco: Never   Tobacco comments:    quit in 1978  Vaping Use   Vaping status: Never Used  Substance and Sexual Activity   Alcohol use: Yes    Alcohol/week: 6.0 standard drinks of alcohol    Types: 6 Cans of beer per week    Comment: a couple beers on the weekends   Drug use: No   Sexual activity: Not on file  Other Topics Concern   Not on file  Social History Narrative   Not on file   Social Drivers of Health   Financial Resource Strain: Low Risk  (03/30/2024)   Received from [facility]   Overall Financial Resource Strain (CARDIA)    Difficulty of Paying Living Expenses: Not hard at all  Food Insecurity: No Food Insecurity (08/31/2024)   Hunger Vital Sign    Worried About Running Out of Food in the Last Year: Never true    Ran Out of Food in the Last Year: Never true  Transportation Needs: No Transportation Needs (08/31/2024)   PRAPARE - Administrator, Civil Service (Medical): No    Lack of Transportation (Non-Medical): No  Physical Activity: Sufficiently Active (03/18/2023)   Received from [facility]   Exercise Vital Sign    On average, how many days per week do you engage in moderate to strenuous exercise (like a brisk walk)?: 4 days    On average, how many minutes do you  engage in exercise at this level?: 70 min  Stress: No Stress Concern Present (03/18/2023)   Received from [facility] of Occupational Health - Occupational Stress Questionnaire    Feeling of Stress : Only a little  Social Connections: Socially Integrated (08/10/2024)   Social Connection and Isolation Panel    Frequency of Communication with Friends and Family: More than three times a week    Frequency of Social Gatherings with Friends and Family: More than three times a week    Attends Religious Services: More than 4 times per year    Active Member of [employer] or Organizations: Yes    Attends Banker Meetings: 1 to 4 times per year  Marital Status: Married  Catering Manager Violence: Not At Risk (08/31/2024)   Humiliation, Afraid, Rape, and Kick questionnaire    Fear of Current or Ex-Partner: No    Emotionally Abused: No    Physically Abused: No    Sexually Abused: No    FAMILY HISTORY: Family History  Problem Relation Age of Onset   Diabetes Mother    Breast cancer Mother    Alcohol abuse Father    Heart disease Maternal Uncle    Rheumatic fever Maternal Uncle    Diabetes Paternal Grandmother    Emphysema Paternal Grandfather     ALLERGIES:  has no known allergies.  MEDICATIONS:  Current Outpatient Medications  Medication Sig Dispense Refill   atorvastatin  (LIPITOR) 20 MG tablet Take 1 tablet (20 mg total) by mouth daily. 90 tablet 3   Blood Glucose Monitoring Suppl (ACCU-CHEK NANO SMARTVIEW) w/Device KIT Use glucometer (Accu-Chek Nano Glucometer) to test fasting glucose daily. 1 kit 0   diltiazem  (CARDIZEM  CD) 120 MG 24 hr capsule Take 1 capsule (120 mg total) by mouth daily. 90 capsule 3   docusate sodium  (COLACE) 100 MG capsule Take 1 capsule (100 mg total) by mouth 2 (two) times daily. 10 capsule 0   empagliflozin  (JARDIANCE ) 25 MG TABS tablet Take 25 mg by mouth.     famotidine  (PEPCID ) 40 MG tablet Take 1 tablet (40 mg  total) by mouth at bedtime. 90 tablet 3   fluticasone  (FLONASE ) 50 MCG/ACT nasal spray Place 1 spray into both nostrils daily as needed for allergies.     glucose blood (TRUE METRIX BLOOD GLUCOSE TEST) test strip Use as instructed 100 each 12   hyoscyamine  (LEVSIN  SL) 0.125 MG SL tablet Place 0.125 mg under the tongue 2 (two) times daily as needed for cramping.     Lancets (ONETOUCH ULTRASOFT) lancets Use as instructed once a day for fasting glucose check. 100 each 3   losartan (COZAAR) 50 MG tablet Take 50 mg by mouth daily.     metFORMIN  (GLUCOPHAGE ) 1000 MG tablet Take 1 tablet (1,000 mg total) by mouth 2 (two) times daily with a meal. 180 tablet 0   Misc Natural Products (OSTEO BI-FLEX ADV DOUBLE ST) CAPS Take 1 capsule by mouth daily.     pantoprazole  (PROTONIX ) 40 MG tablet Take 1 tablet (40 mg total) by mouth daily. 90 tablet 3   pioglitazone  (ACTOS ) 15 MG tablet Take 1 tablet (15 mg total) by mouth daily. 90 tablet 0   Probiotic Product (PROBIOTIC FORMULA) CAPS Take 1 capsule by mouth daily.     sucralfate  (CARAFATE ) 1 g tablet TAKE 1 TABLET FOUR TIMES DAILY - WITH MEALS AND AT BEDTIME. 360 tablet 0   No current facility-administered medications for this visit.    Review of Systems  Constitutional:  Positive for fatigue. Negative for appetite change, chills and fever.  HENT:   Negative for hearing loss and voice change.   Eyes:  Negative for eye problems.  Respiratory:  Negative for chest tightness and cough.   Cardiovascular:  Negative for chest pain.  Gastrointestinal:  Negative for abdominal distention, abdominal pain and blood in stool.  Endocrine: Negative for hot flashes.  Genitourinary:  Negative for difficulty urinating and frequency.   Musculoskeletal:  Negative for arthralgias.  Skin:  Negative for itching and rash.  Neurological:  Negative for extremity weakness.  Hematological:  Negative for adenopathy.  Psychiatric/Behavioral:  Negative for confusion.       PHYSICAL EXAMINATION: ECOG PERFORMANCE STATUS: 0 -  Asymptomatic  Vitals:   08/31/24 1420  BP: 127/74  Pulse: 63  Resp: 18  Temp: (!) 97 F (36.1 C)   Filed Weights   08/31/24 1420  Weight: 245 lb 9.6 oz (111.4 kg)    Physical Exam Constitutional:      General: He is not in acute distress.    Appearance: He is not diaphoretic.  HENT:     Head: Normocephalic and atraumatic.  Eyes:     General: No scleral icterus.    Comments: Right eye ptosis-chronic  Cardiovascular:     Rate and Rhythm: Normal rate and regular rhythm.  Pulmonary:     Effort: Pulmonary effort is normal. No respiratory distress.     Breath sounds: No wheezing.  Abdominal:     General: There is no distension.     Palpations: Abdomen is soft.     Tenderness: There is no abdominal tenderness.     Comments: Status post right nephrectomy-surgical site is healing well, no erythematous changes or drainage.  Musculoskeletal:        General: Normal range of motion.     Cervical back: Normal range of motion and neck supple.  Skin:    General: Skin is warm and dry.     Findings: No erythema.  Neurological:     Mental Status: He is alert and oriented to person, place, and time. Mental status is at baseline.     Motor: No abnormal muscle tone.  Psychiatric:        Mood and Affect: Mood and affect normal.      LABORATORY DATA:  I have reviewed the data as listed    Latest Ref Rng & Units 08/11/2024    6:00 AM 08/10/2024    9:42 PM 08/04/2024    9:31 AM  CBC  WBC 4.0 - 10.5 K/uL 8.8  10.5  4.8   Hemoglobin 13.0 - 17.0 g/dL 85.7  84.7  85.1   Hematocrit 39.0 - 52.0 % 40.6  42.7  41.7   Platelets 150 - 400 K/uL 225  218  255       Latest Ref Rng & Units 08/11/2024    6:00 AM 08/10/2024    9:42 PM 08/04/2024    9:31 AM  CMP  Glucose 70 - 99 mg/dL 756   860   BUN 8 - 23 mg/dL 15   13   Creatinine 9.38 - 1.24 mg/dL 8.61  8.60  9.15   Sodium 135 - 145 mmol/L 132   137   Potassium 3.5 - 5.1 mmol/L  4.2   4.4   Chloride 98 - 111 mmol/L 99   103   CO2 22 - 32 mmol/L 24   26   Calcium  8.9 - 10.3 mg/dL 8.4   9.0      RADIOGRAPHIC STUDIES: I have personally reviewed the radiological images as listed and agreed with the findings in the report. DG Chest 2 View Result Date: 08/04/2024 EXAM: 2 VIEW(S) XRAY OF THE CHEST 08/04/2024 10:04:00 AM COMPARISON: 08/27/2023 CLINICAL HISTORY: Preoperative respiratory examination; Right renal mass FINDINGS: LUNGS AND PLEURA: No focal pulmonary opacity. No pulmonary edema. No pleural effusion. No pneumothorax. HEART AND MEDIASTINUM: No acute abnormality of the cardiac and mediastinal silhouettes. BONES AND SOFT TISSUES: Mild multilevel degenerative changes of thoracic spine. IMPRESSION: 1. No acute cardiopulmonary process. Electronically signed by: Helayne Hurst MD 08/04/2024 10:13 AM EST RP Workstation: [id number]

## 2024-08-31 NOTE — Progress Notes (Signed)
 START ON PATHWAY REGIMEN - Renal Cell     A cycle is every 21 days:     Pembrolizumab   **Always confirm dose/schedule in your pharmacy ordering system**  Patient Characteristics: Postoperative without Neoadjuvant Therapy, M0 (Pathologic Staging), Stage I, II, III, or Resected T4M0 Stage IV, Intermediate-High Risk Therapeutic Status: Postoperative without Neoadjuvant Therapy, M0 (Pathologic Staging) AJCC T Category: pT3a AJCC N Category: pN0 AJCC M Category: cM0 AJCC 8 Stage Grouping: III Risk Status: Intermediate-High Risk Intent of Therapy: Curative Intent, Discussed with Patient

## 2024-08-31 NOTE — Assessment & Plan Note (Addendum)
 pT3a pNx stage III clear-cell RCC, G2, status post right radical nephrectomy. UISS prognostic model intermediate-high risk I recommend 1 year of pembrolizumab every 3 weeks [17 cycles] based on data from phase 3 clinical trial Keynote 564.   Rationale and potential side effects were reviewed and discussed with patient. I discussed the mechanism of action and rationale of using immunotherapy.   Discussed the potential side effects of immunotherapy including but not limited to colitis/diarrhea; skin rash; pneumonitis/respiratory failure, nephritis, muscle/joint pain, elevated LFTs/liver failure,endocrine abnormalities, neuropathy, etc. Patient voices understanding and agrees with proceeding with treatment.   Will arrange patient to get treatment because and anticipate to start in the next 1-2 weeks

## 2024-09-01 ENCOUNTER — Other Ambulatory Visit: Payer: Self-pay

## 2024-09-06 ENCOUNTER — Inpatient Hospital Stay

## 2024-09-07 DIAGNOSIS — E119 Type 2 diabetes mellitus without complications: Secondary | ICD-10-CM | POA: Diagnosis not present

## 2024-09-08 ENCOUNTER — Encounter: Payer: Self-pay | Admitting: Oncology

## 2024-09-09 ENCOUNTER — Telehealth: Payer: Self-pay

## 2024-09-09 NOTE — Telephone Encounter (Signed)
 Intermittent FMLA request for patient's daughter, Suzen Pouch, received.  The paperwork has been completed and faxed to the provided number for [facility], [phone].

## 2024-09-10 NOTE — Progress Notes (Signed)
 Pharmacist Chemotherapy Monitoring - Initial Assessment    Anticipated start date: 09/14/24   The following has been reviewed per standard work regarding the patient's treatment regimen: The patient's diagnosis, treatment plan and drug doses, and organ/hematologic function Lab orders and baseline tests specific to treatment regimen  The treatment plan start date, drug sequencing, and pre-medications Prior authorization status  Patient's documented medication list, including drug-drug interaction screen and prescriptions for anti-emetics and supportive care specific to the treatment regimen The drug concentrations, fluid compatibility, administration routes, and timing of the medications to be used The patient's access for treatment and lifetime cumulative dose history, if applicable  The patient's medication allergies and previous infusion related reactions, if applicable  pT3a pNx stage III clear-cell RCC, G2, status post right radical nephrectomy.  1 year of pembrolizumab every 3 weeks [17 cycles  Changes made to treatment plan:  N/A  Follow up needed:  N/A   Redell JINNY Gaskins, RPH, 09/10/2024  11:30 AM

## 2024-09-14 ENCOUNTER — Inpatient Hospital Stay: Admitting: Oncology

## 2024-09-14 ENCOUNTER — Inpatient Hospital Stay

## 2024-09-14 ENCOUNTER — Encounter: Payer: Self-pay | Admitting: Oncology

## 2024-09-14 VITALS — BP 141/81 | HR 60 | Resp 18

## 2024-09-14 VITALS — BP 135/69 | HR 64 | Temp 97.0°F | Resp 18 | Wt 244.5 lb

## 2024-09-14 DIAGNOSIS — N183 Chronic kidney disease, stage 3 unspecified: Secondary | ICD-10-CM | POA: Insufficient documentation

## 2024-09-14 DIAGNOSIS — C641 Malignant neoplasm of right kidney, except renal pelvis: Secondary | ICD-10-CM

## 2024-09-14 DIAGNOSIS — Z5112 Encounter for antineoplastic immunotherapy: Secondary | ICD-10-CM | POA: Diagnosis not present

## 2024-09-14 DIAGNOSIS — N1832 Chronic kidney disease, stage 3b: Secondary | ICD-10-CM

## 2024-09-14 LAB — CBC WITH DIFFERENTIAL (CANCER CENTER ONLY)
Abs Immature Granulocytes: 0.01 K/uL (ref 0.00–0.07)
Basophils Absolute: 0 K/uL (ref 0.0–0.1)
Basophils Relative: 1 %
Eosinophils Absolute: 0.1 K/uL (ref 0.0–0.5)
Eosinophils Relative: 3 %
HCT: 42.1 % (ref 39.0–52.0)
Hemoglobin: 14.9 g/dL (ref 13.0–17.0)
Immature Granulocytes: 0 %
Lymphocytes Relative: 21 %
Lymphs Abs: 1.1 K/uL (ref 0.7–4.0)
MCH: 32.5 pg (ref 26.0–34.0)
MCHC: 35.4 g/dL (ref 30.0–36.0)
MCV: 91.7 fL (ref 80.0–100.0)
Monocytes Absolute: 0.5 K/uL (ref 0.1–1.0)
Monocytes Relative: 9 %
Neutro Abs: 3.5 K/uL (ref 1.7–7.7)
Neutrophils Relative %: 66 %
Platelet Count: 192 K/uL (ref 150–400)
RBC: 4.59 MIL/uL (ref 4.22–5.81)
RDW: 12.4 % (ref 11.5–15.5)
WBC Count: 5.2 K/uL (ref 4.0–10.5)
nRBC: 0 % (ref 0.0–0.2)

## 2024-09-14 LAB — CMP (CANCER CENTER ONLY)
ALT: 15 U/L (ref 0–44)
AST: 17 U/L (ref 15–41)
Albumin: 4.5 g/dL (ref 3.5–5.0)
Alkaline Phosphatase: 96 U/L (ref 38–126)
Anion gap: 11 (ref 5–15)
BUN: 25 mg/dL — ABNORMAL HIGH (ref 8–23)
CO2: 26 mmol/L (ref 22–32)
Calcium: 9.6 mg/dL (ref 8.9–10.3)
Chloride: 98 mmol/L (ref 98–111)
Creatinine: 1.7 mg/dL — ABNORMAL HIGH (ref 0.61–1.24)
GFR, Estimated: 41 mL/min — ABNORMAL LOW (ref >60)
Glucose, Bld: 266 mg/dL — ABNORMAL HIGH (ref 70–99)
Potassium: 4.9 mmol/L (ref 3.5–5.1)
Sodium: 135 mmol/L (ref 135–145)
Total Bilirubin: 0.8 mg/dL (ref 0.0–1.2)
Total Protein: 7.4 g/dL (ref 6.5–8.1)

## 2024-09-14 LAB — TSH: TSH: 4.09 u[IU]/mL (ref 0.350–4.500)

## 2024-09-14 MED ORDER — SODIUM CHLORIDE 0.9 % IV SOLN
200.0000 mg | Freq: Once | INTRAVENOUS | Status: AC
  Administered 2024-09-14: 11:00:00 200 mg via INTRAVENOUS
  Filled 2024-09-14: qty 8

## 2024-09-14 MED ORDER — SODIUM CHLORIDE 0.9 % IV SOLN
INTRAVENOUS | Status: DC
  Filled 2024-09-14: qty 250

## 2024-09-14 NOTE — Progress Notes (Signed)
 Hematology/Oncology Consult Note [phone] [phone]     REFERRING PROVIDER: Dr. Georganne Riis   CHIEF COMPLAINTS/PURPOSE OF CONSULTATION:  Right RCC  ASSESSMENT & PLAN:   Renal cell carcinoma (HCC) pT3a pNx stage III clear-cell RCC, G2, status post right radical nephrectomy. UISS prognostic model intermediate-high risk recommend 1 year of pembrolizumab  every 3 weeks [17 cycles] based on data from phase 3 clinical trial Keynote 564.    Labs reviewed and discussed with patient.  Proceed with cycle 1 Keytruda .  Encounter for antineoplastic immunotherapy Immunotherapy plan as listed above  CKD (chronic kidney disease) stage 3, GFR 30-59 ml/min (HCC) Kidney function has decreased since his right nephrectomy. Encourage oral hydration and avoid nephrotoxins.     Orders Placed This Encounter  Procedures   CBC with Differential (Cancer Center Only)    Standing Status:   Future    Expected Date:   10/05/2024    Expiration Date:   10/05/2025   CMP (Cancer Center only)    Standing Status:   Future    Expected Date:   10/05/2024    Expiration Date:   10/05/2025   CBC with Differential (Cancer Center Only)    Standing Status:   Future    Expected Date:   10/26/2024    Expiration Date:   10/26/2025   CMP (Cancer Center only)    Standing Status:   Future    Expected Date:   10/26/2024    Expiration Date:   10/26/2025   T4    Standing Status:   Future    Expected Date:   10/26/2024    Expiration Date:   10/26/2025   TSH    Standing Status:   Future    Expected Date:   10/26/2024    Expiration Date:   10/26/2025   Follow-up 3 weeks All questions were answered. The patient knows to call the clinic with any problems, questions or concerns.  Zelphia Cap, MD, PhD [facility] Hematology Oncology 09/14/2024    HISTORY OF PRESENTING ILLNESS:  Steven Miranda 78 y.o. male presents to establish care for right RCC I have reviewed his chart and materials related to his  cancer extensively and collaborated history with the patient. Summary of oncologic history is as follows: Oncology History  Renal cell carcinoma (HCC)  04/01/2024 Imaging   Complete abdomen ultrasound showed 1. No acute abnormality. 2. Hepatic steatosis. 3. Complex right renal cyst measuring up to 7.0 cm. Further evaluation with nonemergent renal protocol CT or MRI with and without IV contrast is recommended.   05/18/2024 Imaging   MRI abdomen with and without contrast showed 1. 6.4 cm complex septated cystic lesion medially in the upper pole of the right kidney with thin enhancing septations in the superior component. This is consistent with an indeterminate cystic renal mass, best classified as Bosniak III. Urology consultation recommended. 2. Additional simple cyst in the interpolar region of the right kidney (Bosniak 1). No suspicious left renal lesions. 3. No adenopathy. 4. No acute abdominal findings   08/10/2024 Surgery   Patient underwent right radical nephrectomy  Pathology showed 1. Kidney, radical nephrectomy for tumor, right kidney :       RENAL CELL CARCINOMA, CLEAR-CELL TYPE, MULTICYSTIC, 4.5 CM.       TUMOR FOCALLY EXTENDS INTO RENAL SINUS ADIPOSE TISSUE.       NEGATIVE FOR LYMPHOVASCULAR INVASION.       ALL SURGICAL MARGINS NEGATIVE FOR TUMOR.       SEE ONCOLOGY  TABLE AND COMMENT   KIDNEY: Nephrectomy Procedure: Right nephrectomy Specimen Laterality: Right Tumor Size: 4.5 x 4 x 3 cm Tumor Focality: Unifocal Histologic Type: Multicystic clear-cell renal cell carcinoma Sarcomatoid Features: Not identified Rhabdoid Features: Not identified Histologic Grade: WHO grade 2 Tumor Necrosis: Minimal Tumor Extension: Into renal sinus adipose tissue Lymphatic and/or Vascular Invasion: Not identified Margins: All surgical margins negative for tumor Regional Lymph Nodes: No lymph nodes submitted Distant Metastasis: Distant Site(s) Involved: Not applicable Additional Findings in  Nonneoplastic Kidney: No significant findings Pathologic Stage Classification (pTNM, AJCC 8th Edition): pT3a, pN not assigned Ancillary Studies: Immunohistochemistry, see comment Representative Tumor Block: 1D-1J Comment(s): The tumor is predominantly cystic with cyst lined by cells with clear cytoplasm. Immunohistochemistry shows the tumor cells are positive with CA-IX and cytokeratin 7 with mostly negative staining with CD10 and P504S. The morphologic features are consistent with clear-cell type renal cell carcinoma predominantly cystic which focally extends into renal sinus adipose tissue.   08/16/2024 Initial Diagnosis   Renal cell carcinoma (HCC)   08/31/2024 Cancer Staging   Staging form: Kidney, AJCC 8th Edition - Pathologic stage from 08/31/2024: Stage III (pT3a, pN0, cM0) - Signed by Babara Call, MD on 08/31/2024 Stage prefix: Initial diagnosis   09/14/2024 -  Chemotherapy   Patient is on Treatment Plan : Pembrolizumab  (200) q21d      Patient was accompanied by his spouse. He recovers well from recent nephrectomy surgery.  Appetite is fair.  He has some mild discomfort around surgical site, improving.  Denies any unintentional weight loss, night sweats fever or chills.  Patient denies smoking.  Family history of mother with breast cancer. Mild fatigue  Patient denies any history of autoimmune disease.  Has a history of Barrett's esophagus, diabetes, hypertension, colon polyp.  Today patient reports feeling well.  No new complaints.  He has been to chemotherapy class.  MEDICAL HISTORY:  Past Medical History:  Diagnosis Date   Actinic keratosis    Aortic atherosclerosis    Arthritis    right thumb   Ascending aorta dilatation    Barrett's esophagus    DDD (degenerative disc disease), thoracic    Diastolic dysfunction    Dysuria    GERD (gastroesophageal reflux disease)    Hepatic steatosis    Hyperlipidemia    Hypertension    Paroxysmal atrial fibrillation (HCC)    a.)  CHA2DS2VASc = 5 (age x2, HTN, vascular disease, T2DM) as of 08/04/2024; b.) rate/rhythm maintained on oral diltiazem ; no chronic OAC   Renal mass, right    T2DM (type 2 diabetes mellitus) (HCC)    Wears contact lenses     SURGICAL HISTORY: Past Surgical History:  Procedure Laterality Date   BROW LIFT Right 06/14/2022   Procedure: BLEPHAROPTOSIS REPAIR; RESECT EX RIGHT DIABETIC;  Surgeon: Ashley Greig HERO, MD;  Location[facility];  Service: Ophthalmology;  Laterality: Right;   COLONOSCOPY  2005   COLONOSCOPY WITH PROPOFOL  N/A 10/16/2016   Procedure: COLONOSCOPY WITH PROPOFOL ;  Surgeon: Reyes LELON Cota, MD;  Location: [facility];  Service: Endoscopy;  Laterality: N/A;   COLONOSCOPY WITH PROPOFOL  N/A 12/25/2021   Procedure: COLONOSCOPY WITH PROPOFOL ;  Surgeon: Jinny Carmine, MD;  Location: [facility];  Service: Endoscopy;  Laterality: N/A;   COLONOSCOPY WITH PROPOFOL  N/A 12/31/2022   Procedure: COLONOSCOPY WITH PROPOFOL ;  Surgeon: Jinny Carmine, MD;  Location: [facility];  Service: Endoscopy;  Laterality: N/A;   ESOPHAGOGASTRODUODENOSCOPY N/A 12/31/2022   Procedure: ESOPHAGOGASTRODUODENOSCOPY (EGD);  Surgeon: Jinny Carmine, MD;  Location: [facility];  Service: Endoscopy;  Laterality: N/A;   ESOPHAGOGASTRODUODENOSCOPY (EGD) WITH PROPOFOL  N/A 12/13/2019   Procedure: ESOPHAGOGASTRODUODENOSCOPY (EGD) WITH BIOPSY;  Surgeon: Jinny Carmine, MD;  Location: [facility];  Service: Endoscopy;  Laterality: N/A;  Diabetic - oral meds   PLANTAR'S WART EXCISION     ROBOT ASSISTED LAPAROSCOPIC NEPHRECTOMY Right 08/10/2024   Procedure: NEPHRECTOMY, RADICAL, ROBOT-ASSISTED, LAPAROSCOPIC, ADULT;  Surgeon: Georganne Penne SAUNDERS, MD;  Location: [facility];  Service: Urology;  Laterality: Right;   STRABISMUS SURGERY Right     SOCIAL HISTORY: Social History   Socioeconomic History   Marital status: Married    Spouse name: Not on file   Number of children: 3   Years of education: Not  on file   Highest education level: Bachelor's degree (e.g., BA, AB, BS)  Occupational History   Not on file  Tobacco Use   Smoking status: Former    Current packs/day: 0.00    Types: Cigarettes    Quit date: 1978    Years since quitting: 47.9   Smokeless tobacco: Never   Tobacco comments:    quit in 1978  Vaping Use   Vaping status: Never Used  Substance and Sexual Activity   Alcohol use: Yes    Alcohol/week: 6.0 standard drinks of alcohol    Types: 6 Cans of beer per week    Comment: a couple beers on the weekends   Drug use: No   Sexual activity: Not on file  Other Topics Concern   Not on file  Social History Narrative   Not on file   Social Drivers of Health   Tobacco Use: Medium Risk (09/14/2024)   Patient History    Smoking Tobacco Use: Former    Smokeless Tobacco Use: Never    Passive Exposure: Not on file  Financial Resource Strain: Low Risk  (03/30/2024)   Received from [facility]   Overall Financial Resource Strain (CARDIA)    Difficulty of Paying Living Expenses: Not hard at all  Food Insecurity: No Food Insecurity (08/31/2024)   Epic    Worried About Radiation Protection Practitioner of Food in the Last Year: Never true    Ran Out of Food in the Last Year: Never true  Transportation Needs: No Transportation Needs (08/31/2024)   Epic    Lack of Transportation (Medical): No    Lack of Transportation (Non-Medical): No  Physical Activity: Sufficiently Active (03/18/2023)   Received from [facility]   Exercise Vital Sign    On average, how many days per week do you engage in moderate to strenuous exercise (like a brisk walk)?: 4 days    On average, how many minutes do you engage in exercise at this level?: 70 min  Stress: No Stress Concern Present (03/18/2023)   Received from [facility] - Occupational Stress Questionnaire    Feeling of Stress : Only a little  Social Connections:  Socially Integrated (08/10/2024)   Social Connection and Isolation Panel    Frequency of Communication with Friends and Family: More than three times a week    Frequency of Social Gatherings with Friends and Family: More than three times a week    Attends Religious Services: More than 4 times per year    Active Member of [employer] or Organizations: Yes    Attends Banker Meetings: 1 to 4 times per year    Marital Status: Married  Catering Manager  Violence: Not At Risk (08/31/2024)   Epic    Fear of Current or Ex-Partner: No    Emotionally Abused: No    Physically Abused: No    Sexually Abused: No  Depression (PHQ2-9): Low Risk (09/14/2024)   Depression (PHQ2-9)    PHQ-2 Score: 0  Alcohol Screen: Low Risk (11/14/2021)   Alcohol Screen    Last Alcohol Screening Score (AUDIT): 3  Housing: Low Risk (08/31/2024)   Epic    Unable to Pay for Housing in the Last Year: No    Number of Times Moved in the Last Year: 0    Homeless in the Last Year: No  Utilities: Not At Risk (08/31/2024)   Epic    Threatened with loss of utilities: No  Health Literacy: Adequate Health Literacy (03/18/2023)   Received from [facility]   B1300 Health Literacy    Frequency of need for help with medical instructions: Never    FAMILY HISTORY: Family History  Problem Relation Age of Onset   Diabetes Mother    Breast cancer Mother    Alcohol abuse Father    Heart disease Maternal Uncle    Rheumatic fever Maternal Uncle    Diabetes Paternal Grandmother    Emphysema Paternal Grandfather     ALLERGIES:  has no known allergies.  MEDICATIONS:  Current Outpatient Medications  Medication Sig Dispense Refill   atorvastatin  (LIPITOR) 20 MG tablet Take 1 tablet (20 mg total) by mouth daily. 90 tablet 3   Blood Glucose Monitoring Suppl (ACCU-CHEK NANO SMARTVIEW) w/Device KIT Use glucometer (Accu-Chek Nano Glucometer) to test fasting glucose daily. 1 kit 0   diltiazem  (CARDIZEM  CD) 120 MG  24 hr capsule Take 1 capsule (120 mg total) by mouth daily. 90 capsule 3   docusate sodium  (COLACE) 100 MG capsule Take 1 capsule (100 mg total) by mouth 2 (two) times daily. 10 capsule 0   empagliflozin  (JARDIANCE ) 25 MG TABS tablet Take 25 mg by mouth.     famotidine  (PEPCID ) 40 MG tablet Take 1 tablet (40 mg total) by mouth at bedtime. 90 tablet 3   fluticasone  (FLONASE ) 50 MCG/ACT nasal spray Place 1 spray into both nostrils daily as needed for allergies.     glucose blood (TRUE METRIX BLOOD GLUCOSE TEST) test strip Use as instructed 100 each 12   hyoscyamine  (LEVSIN  SL) 0.125 MG SL tablet Place 0.125 mg under the tongue 2 (two) times daily as needed for cramping.     Lancets (ONETOUCH ULTRASOFT) lancets Use as instructed once a day for fasting glucose check. 100 each 3   losartan (COZAAR) 50 MG tablet Take 50 mg by mouth daily.     metFORMIN  (GLUCOPHAGE ) 1000 MG tablet Take 1 tablet (1,000 mg total) by mouth 2 (two) times daily with a meal. 180 tablet 0   Misc Natural Products (OSTEO BI-FLEX ADV DOUBLE ST) CAPS Take 1 capsule by mouth daily.     pantoprazole  (PROTONIX ) 40 MG tablet Take 1 tablet (40 mg total) by mouth daily. 90 tablet 3   pioglitazone  (ACTOS ) 15 MG tablet Take 1 tablet (15 mg total) by mouth daily. 90 tablet 0   Probiotic Product (PROBIOTIC FORMULA) CAPS Take 1 capsule by mouth daily.     prochlorperazine  (COMPAZINE ) 10 MG tablet Take 1 tablet (10 mg total) by mouth every 6 (six) hours as needed for nausea or vomiting. 30 tablet 1   sucralfate  (CARAFATE ) 1 g tablet TAKE 1 TABLET FOUR TIMES DAILY - WITH MEALS  AND AT BEDTIME. 360 tablet 0   No current facility-administered medications for this visit.    Review of Systems  Constitutional:  Positive for fatigue. Negative for appetite change, chills and fever.  HENT:   Negative for hearing loss and voice change.   Eyes:  Negative for eye problems.  Respiratory:  Negative for chest tightness and cough.   Cardiovascular:   Negative for chest pain.  Gastrointestinal:  Negative for abdominal distention, abdominal pain and blood in stool.  Endocrine: Negative for hot flashes.  Genitourinary:  Negative for difficulty urinating and frequency.   Musculoskeletal:  Negative for arthralgias.  Skin:  Negative for itching and rash.  Neurological:  Negative for extremity weakness.  Hematological:  Negative for adenopathy.  Psychiatric/Behavioral:  Negative for confusion.      PHYSICAL EXAMINATION: ECOG PERFORMANCE STATUS: 0 - Asymptomatic  Vitals:   09/14/24 0939  BP: 135/69  Pulse: 64  Resp: 18  Temp: (!) 97 F (36.1 C)  SpO2: 96%   Filed Weights   09/14/24 0939  Weight: 244 lb 8 oz (110.9 kg)    Physical Exam Constitutional:      General: He is not in acute distress.    Appearance: He is not diaphoretic.  HENT:     Head: Normocephalic and atraumatic.  Eyes:     General: No scleral icterus.    Comments: Right eye ptosis-chronic  Cardiovascular:     Rate and Rhythm: Normal rate and regular rhythm.  Pulmonary:     Effort: Pulmonary effort is normal. No respiratory distress.     Breath sounds: No wheezing.  Abdominal:     General: There is no distension.     Palpations: Abdomen is soft.     Tenderness: There is no abdominal tenderness.     Comments: Status post right nephrectomy  Musculoskeletal:        General: Normal range of motion.     Cervical back: Normal range of motion and neck supple.  Skin:    General: Skin is warm and dry.     Findings: No erythema.  Neurological:     Mental Status: He is alert and oriented to person, place, and time. Mental status is at baseline.     Motor: No abnormal muscle tone.  Psychiatric:        Mood and Affect: Mood and affect normal.      LABORATORY DATA:  I have reviewed the data as listed    Latest Ref Rng & Units 09/14/2024    8:50 AM 08/11/2024    6:00 AM 08/10/2024    9:42 PM  CBC  WBC 4.0 - 10.5 K/uL 5.2  8.8  10.5   Hemoglobin 13.0 -  17.0 g/dL 85.0  85.7  84.7   Hematocrit 39.0 - 52.0 % 42.1  40.6  42.7   Platelets 150 - 400 K/uL 192  225  218       Latest Ref Rng & Units 09/14/2024    8:50 AM 08/11/2024    6:00 AM 08/10/2024    9:42 PM  CMP  Glucose 70 - 99 mg/dL 733  756    BUN 8 - 23 mg/dL 25  15    Creatinine 9.38 - 1.24 mg/dL 8.29  8.61  8.60   Sodium 135 - 145 mmol/L 135  132    Potassium 3.5 - 5.1 mmol/L 4.9  4.2    Chloride 98 - 111 mmol/L 98  99    CO2 22 -  32 mmol/L 26  24    Calcium  8.9 - 10.3 mg/dL 9.6  8.4    Total Protein 6.5 - 8.1 g/dL 7.4     Total Bilirubin 0.0 - 1.2 mg/dL 0.8     Alkaline Phos 38 - 126 U/L 96     AST 15 - 41 U/L 17     ALT 0 - 44 U/L 15        RADIOGRAPHIC STUDIES: I have personally reviewed the radiological images as listed and agreed with the findings in the report. No results found.

## 2024-09-14 NOTE — Assessment & Plan Note (Signed)
 pT3a pNx stage III clear-cell RCC, G2, status post right radical nephrectomy. UISS prognostic model intermediate-high risk recommend 1 year of pembrolizumab  every 3 weeks [17 cycles] based on data from phase 3 clinical trial Keynote 564.    Labs reviewed and discussed with patient.  Proceed with cycle 1 Keytruda .

## 2024-09-14 NOTE — Assessment & Plan Note (Signed)
 Kidney function has decreased since his right nephrectomy. Encourage oral hydration and avoid nephrotoxins.

## 2024-09-14 NOTE — Patient Instructions (Signed)
 [facility] - A DEPT OF [facility]  Discharge Instructions: Thank you for choosing [facility] to provide your oncology and hematology care.  If you have a lab appointment with the Cancer Center, please go directly to the Cancer Center and check in at the registration area.  Wear comfortable clothing and clothing appropriate for easy access to any Portacath or PICC line.   We strive to give you quality time with your provider. You may need to reschedule your appointment if you arrive late (15 or more minutes).  Arriving late affects you and other patients whose appointments are after yours.  Also, if you miss three or more appointments without notifying the office, you may be dismissed from the clinic at the provider's discretion.      For prescription refill requests, have your pharmacy contact our office and allow 72 hours for refills to be completed.    Today you received the following chemotherapy and/or immunotherapy agents Keytruda      To help prevent nausea and vomiting after your treatment, we encourage you to take your nausea medication as directed.  BELOW ARE SYMPTOMS THAT SHOULD BE REPORTED IMMEDIATELY: *FEVER GREATER THAN 100.4 F (38 C) OR HIGHER *CHILLS OR SWEATING *NAUSEA AND VOMITING THAT IS NOT CONTROLLED WITH YOUR NAUSEA MEDICATION *UNUSUAL SHORTNESS OF BREATH *UNUSUAL BRUISING OR BLEEDING *URINARY PROBLEMS (pain or burning when urinating, or frequent urination) *BOWEL PROBLEMS (unusual diarrhea, constipation, pain near the anus) TENDERNESS IN MOUTH AND THROAT WITH OR WITHOUT PRESENCE OF ULCERS (sore throat, sores in mouth, or a toothache) UNUSUAL RASH, SWELLING OR PAIN  UNUSUAL VAGINAL DISCHARGE OR ITCHING   Items with * indicate a potential emergency and should be followed up as soon as possible or go to the Emergency Department if any problems should occur.  Please show the CHEMOTHERAPY ALERT CARD or IMMUNOTHERAPY  ALERT CARD at check-in to the Emergency Department and triage nurse.  Should you have questions after your visit or need to cancel or reschedule your appointment, please contact [facility] - A DEPT OF [facility]  [phone] and follow the prompts.  Office hours are 8:00 a.m. to 4:30 p.m. Monday - Friday. Please note that voicemails left after 4:00 p.m. may not be returned until the following business day.  We are closed weekends and major holidays. You have access to a nurse at all times for urgent questions. Please call the main number to the clinic [phone] and follow the prompts.  For any non-urgent questions, you may also contact your provider using MyChart. We now offer e-Visits for anyone 52 and older to request care online for non-urgent symptoms. For details visit [email or web address].   Also download the MyChart app! Go to the app store, search "MyChart", open the app, select [place], and log in with your MyChart username and password.    Pembrolizumab Injection What is this medication? PEMBROLIZUMAB (PEM broe LIZ ue mab) treats some types of cancer. It works by helping your immune system slow or stop the spread of cancer cells. It is a monoclonal antibody. This medicine may be used for other purposes; ask your health care provider or pharmacist if you have questions. COMMON BRAND NAME(S): Keytruda What should I tell my care team before I take this medication? They need to know if you have any of these conditions: Allogeneic stem cell transplant (uses someone else's stem cells) Autoimmune diseases, such as  Crohn disease, ulcerative colitis, lupus History of chest radiation Nervous system problems, such as Guillain-Barre syndrome, myasthenia gravis Organ transplant An unusual or allergic reaction to pembrolizumab, other medications, foods, dyes, or preservatives Pregnant or trying to get pregnant Breast-feeding How should I use this  medication? This medication is injected into a vein. It is given by your care team in a hospital or clinic setting. A special MedGuide will be given to you before each treatment. Be sure to read this information carefully each time. Talk to your care team about the use of this medication in children. While it may be prescribed for children as young as 6 months for selected conditions, precautions do apply. Overdosage: If you think you have taken too much of this medicine contact a poison control center or emergency room at once. NOTE: This medicine is only for you. Do not share this medicine with others. What if I miss a dose? Keep appointments for follow-up doses. It is important not to miss your dose. Call your care team if you are unable to keep an appointment. What may interact with this medication? Interactions have not been studied. This list may not describe all possible interactions. Give your health care provider a list of all the medicines, herbs, non-prescription drugs, or dietary supplements you use. Also tell them if you smoke, drink alcohol, or use illegal drugs. Some items may interact with your medicine. What should I watch for while using this medication? Your condition will be monitored carefully while you are receiving this medication. You may need blood work while taking this medication. This medication may cause serious skin reactions. They can happen weeks to months after starting the medication. Contact your care team right away if you notice fevers or flu-like symptoms with a rash. The rash may be red or purple and then turn into blisters or peeling of the skin. You may also notice a red rash with swelling of the face, lips, or lymph nodes in your neck or under your arms. Tell your care team right away if you have any change in your eyesight. Talk to your care team if you may be pregnant. Serious birth defects can occur if you take this medication during pregnancy and for 4  months after the last dose. You will need a negative pregnancy test before starting this medication. Contraception is recommended while taking this medication and for 4 months after the last dose. Your care team can help you find the option that works for you. Do not breastfeed while taking this medication and for 4 months after the last dose. What side effects may I notice from receiving this medication? Side effects that you should report to your care team as soon as possible: Allergic reactions--skin rash, itching, hives, swelling of the face, lips, tongue, or throat Dry cough, shortness of breath or trouble breathing Eye pain, redness, irritation, or discharge with blurry or decreased vision Heart muscle inflammation--unusual weakness or fatigue, shortness of breath, chest pain, fast or irregular heartbeat, dizziness, swelling of the ankles, feet, or hands Hormone gland problems--headache, sensitivity to light, unusual weakness or fatigue, dizziness, fast or irregular heartbeat, increased sensitivity to cold or heat, excessive sweating, constipation, hair loss, increased thirst or amount of urine, tremors or shaking, irritability Infusion reactions--chest pain, shortness of breath or trouble breathing, feeling faint or lightheaded Kidney injury (glomerulonephritis)--decrease in the amount of urine, red or dark brown urine, foamy or bubbly urine, swelling of the ankles, hands, or feet Liver injury--right upper belly  pain, loss of appetite, nausea, light-colored stool, dark yellow or brown urine, yellowing skin or eyes, unusual weakness or fatigue Pain, tingling, or numbness in the hands or feet, muscle weakness, change in vision, confusion or trouble speaking, loss of balance or coordination, trouble walking, seizures Rash, fever, and swollen lymph nodes Redness, blistering, peeling, or loosening of the skin, including inside the mouth Sudden or severe stomach pain, bloody diarrhea, fever, nausea,  vomiting Side effects that usually do not require medical attention (report to your care team if they continue or are bothersome): Bone, joint, or muscle pain Diarrhea Fatigue Loss of appetite Nausea Skin rash This list may not describe all possible side effects. Call your doctor for medical advice about side effects. You may report side effects to FDA at 1-800-FDA-1088. Where should I keep my medication? This medication is given in a hospital or clinic. It will not be stored at home. NOTE: This sheet is a summary. It may not cover all possible information. If you have questions about this medicine, talk to your doctor, pharmacist, or health care provider.  2024 Elsevier/Gold Standard (2022-01-29 00:00:00)

## 2024-09-14 NOTE — Assessment & Plan Note (Signed)
 Immunotherapy plan as listed above

## 2024-09-15 LAB — T4: T4, Total: 6.9 ug/dL (ref 4.5–12.0)

## 2024-09-16 ENCOUNTER — Telehealth: Payer: Self-pay

## 2024-09-16 NOTE — Telephone Encounter (Signed)
Telephone call to patient for follow up after receiving first infusion.   Patient states infusion went great.  States eating good and drinking plenty of fluids.   Denies any nausea or vomiting.  Encouraged patient to call for any concerns or questions. 

## 2024-10-05 ENCOUNTER — Inpatient Hospital Stay

## 2024-10-05 ENCOUNTER — Encounter: Payer: Self-pay | Admitting: Oncology

## 2024-10-05 ENCOUNTER — Inpatient Hospital Stay: Admitting: Oncology

## 2024-10-05 ENCOUNTER — Inpatient Hospital Stay: Attending: Oncology

## 2024-10-05 VITALS — BP 133/77 | HR 59 | Temp 96.7°F | Resp 18 | Wt 244.5 lb

## 2024-10-05 VITALS — BP 151/88 | HR 56

## 2024-10-05 DIAGNOSIS — R5383 Other fatigue: Secondary | ICD-10-CM | POA: Diagnosis not present

## 2024-10-05 DIAGNOSIS — C641 Malignant neoplasm of right kidney, except renal pelvis: Secondary | ICD-10-CM | POA: Diagnosis not present

## 2024-10-05 DIAGNOSIS — J3489 Other specified disorders of nose and nasal sinuses: Secondary | ICD-10-CM | POA: Diagnosis not present

## 2024-10-05 DIAGNOSIS — Z87891 Personal history of nicotine dependence: Secondary | ICD-10-CM | POA: Insufficient documentation

## 2024-10-05 DIAGNOSIS — N1832 Chronic kidney disease, stage 3b: Secondary | ICD-10-CM

## 2024-10-05 DIAGNOSIS — Z5112 Encounter for antineoplastic immunotherapy: Secondary | ICD-10-CM

## 2024-10-05 DIAGNOSIS — Z905 Acquired absence of kidney: Secondary | ICD-10-CM | POA: Insufficient documentation

## 2024-10-05 DIAGNOSIS — N183 Chronic kidney disease, stage 3 unspecified: Secondary | ICD-10-CM | POA: Diagnosis not present

## 2024-10-05 DIAGNOSIS — M791 Myalgia, unspecified site: Secondary | ICD-10-CM | POA: Diagnosis not present

## 2024-10-05 DIAGNOSIS — Z803 Family history of malignant neoplasm of breast: Secondary | ICD-10-CM | POA: Insufficient documentation

## 2024-10-05 LAB — CBC WITH DIFFERENTIAL (CANCER CENTER ONLY)
Abs Immature Granulocytes: 0.03 K/uL (ref 0.00–0.07)
Basophils Absolute: 0 K/uL (ref 0.0–0.1)
Basophils Relative: 1 %
Eosinophils Absolute: 0.2 K/uL (ref 0.0–0.5)
Eosinophils Relative: 3 %
HCT: 40.6 % (ref 39.0–52.0)
Hemoglobin: 14.5 g/dL (ref 13.0–17.0)
Immature Granulocytes: 1 %
Lymphocytes Relative: 17 %
Lymphs Abs: 1.1 K/uL (ref 0.7–4.0)
MCH: 32.3 pg (ref 26.0–34.0)
MCHC: 35.7 g/dL (ref 30.0–36.0)
MCV: 90.4 fL (ref 80.0–100.0)
Monocytes Absolute: 0.7 K/uL (ref 0.1–1.0)
Monocytes Relative: 11 %
Neutro Abs: 4.2 K/uL (ref 1.7–7.7)
Neutrophils Relative %: 67 %
Platelet Count: 260 K/uL (ref 150–400)
RBC: 4.49 MIL/uL (ref 4.22–5.81)
RDW: 12.7 % (ref 11.5–15.5)
WBC Count: 6.1 K/uL (ref 4.0–10.5)
nRBC: 0 % (ref 0.0–0.2)

## 2024-10-05 LAB — CMP (CANCER CENTER ONLY)
ALT: 14 U/L (ref 0–44)
AST: 16 U/L (ref 15–41)
Albumin: 4.3 g/dL (ref 3.5–5.0)
Alkaline Phosphatase: 91 U/L (ref 38–126)
Anion gap: 12 (ref 5–15)
BUN: 19 mg/dL (ref 8–23)
CO2: 23 mmol/L (ref 22–32)
Calcium: 9.1 mg/dL (ref 8.9–10.3)
Chloride: 100 mmol/L (ref 98–111)
Creatinine: 1.46 mg/dL — ABNORMAL HIGH (ref 0.61–1.24)
GFR, Estimated: 50 mL/min — ABNORMAL LOW
Glucose, Bld: 135 mg/dL — ABNORMAL HIGH (ref 70–99)
Potassium: 4 mmol/L (ref 3.5–5.1)
Sodium: 136 mmol/L (ref 135–145)
Total Bilirubin: 0.7 mg/dL (ref 0.0–1.2)
Total Protein: 7.3 g/dL (ref 6.5–8.1)

## 2024-10-05 MED ORDER — SODIUM CHLORIDE 0.9 % IV SOLN
INTRAVENOUS | Status: DC
  Filled 2024-10-05: qty 250

## 2024-10-05 MED ORDER — SODIUM CHLORIDE 0.9 % IV SOLN
200.0000 mg | Freq: Once | INTRAVENOUS | Status: AC
  Administered 2024-10-05: 200 mg via INTRAVENOUS
  Filled 2024-10-05: qty 8

## 2024-10-05 NOTE — Assessment & Plan Note (Addendum)
 pT3a pNx stage III clear-cell RCC, G2, status post right radical nephrectomy. UISS prognostic model intermediate-high risk recommend 1 year of pembrolizumab  every 3 weeks [17 cycles] based on data from phase 3 clinical trial Keynote 564.    Labs reviewed and discussed with patient.  Proceed with cycle 2 Keytruda .

## 2024-10-05 NOTE — Progress Notes (Signed)
 Pt here for follow up. Pt reports that when he urinates its frothy,  no other symptoms

## 2024-10-05 NOTE — Assessment & Plan Note (Addendum)
 Kidney function has decreased since his right nephrectomy. Encourage oral hydration and avoid nephrotoxins.   Frothy urine could be due to proteinuria.  She plans to further discuss with primary care provider/nephrology evaluation.  No dysuria/increased urgency.

## 2024-10-05 NOTE — Progress Notes (Signed)
 " Hematology/Oncology Consult Note [phone] [phone]     REFERRING PROVIDER: Dr. Georganne Riis   CHIEF COMPLAINTS/PURPOSE OF CONSULTATION:  Right RCC  ASSESSMENT & PLAN:   Renal cell carcinoma (HCC) pT3a pNx stage III clear-cell RCC, G2, status post right radical nephrectomy. UISS prognostic model intermediate-high risk recommend 1 year of pembrolizumab  every 3 weeks [17 cycles] based on data from phase 3 clinical trial Keynote 564.    Labs reviewed and discussed with patient.  Proceed with cycle 2 Keytruda .  CKD (chronic kidney disease) stage 3, GFR 30-59 ml/min (HCC) Kidney function has decreased since his right nephrectomy. Encourage oral hydration and avoid nephrotoxins.   Frothy urine could be due to proteinuria.  She plans to further discuss with primary care provider/nephrology evaluation.  No dysuria/increased urgency.  Encounter for antineoplastic immunotherapy Immunotherapy plan as listed above   Orders Placed This Encounter  Procedures   CBC with Differential (Cancer Center Only)    Standing Status:   Future    Expected Date:   11/16/2024    Expiration Date:   11/16/2025   CMP (Cancer Center only)    Standing Status:   Future    Expected Date:   11/16/2024    Expiration Date:   11/16/2025   Follow-up 3 weeks All questions were answered. The patient knows to call the clinic with any problems, questions or concerns.  Zelphia Cap, MD, PhD [facility] Hematology Oncology 10/05/2024    HISTORY OF PRESENTING ILLNESS:  Steven Miranda 79 y.o. male presents to establish care for right RCC I have reviewed his chart and materials related to his cancer extensively and collaborated history with the patient. Summary of oncologic history is as follows: Oncology History  Renal cell carcinoma (HCC)  04/01/2024 Imaging   Complete abdomen ultrasound showed 1. No acute abnormality. 2. Hepatic steatosis. 3. Complex right renal cyst measuring up to 7.0  cm. Further evaluation with nonemergent renal protocol CT or MRI with and without IV contrast is recommended.   05/18/2024 Imaging   MRI abdomen with and without contrast showed 1. 6.4 cm complex septated cystic lesion medially in the upper pole of the right kidney with thin enhancing septations in the superior component. This is consistent with an indeterminate cystic renal mass, best classified as Bosniak III. Urology consultation recommended. 2. Additional simple cyst in the interpolar region of the right kidney (Bosniak 1). No suspicious left renal lesions. 3. No adenopathy. 4. No acute abdominal findings   08/10/2024 Surgery   Patient underwent right radical nephrectomy  Pathology showed 1. Kidney, radical nephrectomy for tumor, right kidney :       RENAL CELL CARCINOMA, CLEAR-CELL TYPE, MULTICYSTIC, 4.5 CM.       TUMOR FOCALLY EXTENDS INTO RENAL SINUS ADIPOSE TISSUE.       NEGATIVE FOR LYMPHOVASCULAR INVASION.       ALL SURGICAL MARGINS NEGATIVE FOR TUMOR.       SEE ONCOLOGY TABLE AND COMMENT   KIDNEY: Nephrectomy Procedure: Right nephrectomy Specimen Laterality: Right Tumor Size: 4.5 x 4 x 3 cm Tumor Focality: Unifocal Histologic Type: Multicystic clear-cell renal cell carcinoma Sarcomatoid Features: Not identified Rhabdoid Features: Not identified Histologic Grade: WHO grade 2 Tumor Necrosis: Minimal Tumor Extension: Into renal sinus adipose tissue Lymphatic and/or Vascular Invasion: Not identified Margins: All surgical margins negative for tumor Regional Lymph Nodes: No lymph nodes submitted Distant Metastasis: Distant Site(s) Involved: Not applicable Additional Findings in Nonneoplastic Kidney: No significant findings Pathologic Stage Classification (pTNM,  AJCC 8th Edition): pT3a, pN not assigned Ancillary Studies: Immunohistochemistry, see comment Representative Tumor Block: 1D-1J Comment(s): The tumor is predominantly cystic with cyst lined by cells with clear cytoplasm.  Immunohistochemistry shows the tumor cells are positive with CA-IX and cytokeratin 7 with mostly negative staining with CD10 and P504S. The morphologic features are consistent with clear-cell type renal cell carcinoma predominantly cystic which focally extends into renal sinus adipose tissue.   08/16/2024 Initial Diagnosis   Renal cell carcinoma (HCC)   08/31/2024 Cancer Staging   Staging form: Kidney, AJCC 8th Edition - Pathologic stage from 08/31/2024: Stage III (pT3a, pN0, cM0) - Signed by Babara Call, MD on 08/31/2024 Stage prefix: Initial diagnosis   09/14/2024 -  Chemotherapy   Patient is on Treatment Plan : Pembrolizumab  (200) q21d      Patient was accompanied by his spouse. Overall he tolerated first cycle of Keytruda  well.  He reports several episodes of loose bowel movements 1 day after treatments.  Spontaneously resolved. Patient reports chronic foamy/frothy urine for several months.  MEDICAL HISTORY:  Past Medical History:  Diagnosis Date   Actinic keratosis    Aortic atherosclerosis    Arthritis    right thumb   Ascending aorta dilatation    Barrett's esophagus    DDD (degenerative disc disease), thoracic    Diastolic dysfunction    Dysuria    GERD (gastroesophageal reflux disease)    Hepatic steatosis    Hyperlipidemia    Hypertension    Paroxysmal atrial fibrillation (HCC)    a.) CHA2DS2VASc = 5 (age x2, HTN, vascular disease, T2DM) as of 08/04/2024; b.) rate/rhythm maintained on oral diltiazem ; no chronic OAC   Renal mass, right    T2DM (type 2 diabetes mellitus) (HCC)    Wears contact lenses     SURGICAL HISTORY: Past Surgical History:  Procedure Laterality Date   BROW LIFT Right 06/14/2022   Procedure: BLEPHAROPTOSIS REPAIR; RESECT EX RIGHT DIABETIC;  Surgeon: Ashley Greig HERO, MD;  Location: [facility];  Service: Ophthalmology;  Laterality: Right;   COLONOSCOPY  2005   COLONOSCOPY WITH PROPOFOL  N/A 10/16/2016   Procedure: COLONOSCOPY WITH PROPOFOL ;   Surgeon: Reyes LELON Cota, MD;  Location: [facility] ENDOSCOPY;  Service: Endoscopy;  Laterality: N/A;   COLONOSCOPY WITH PROPOFOL  N/A 12/25/2021   Procedure: COLONOSCOPY WITH PROPOFOL ;  Surgeon: Jinny Carmine, MD;  Location[facility] ENDOSCOPY;  Service: Endoscopy;  Laterality: N/A;   COLONOSCOPY WITH PROPOFOL  N/A 12/31/2022   Procedure: COLONOSCOPY WITH PROPOFOL ;  Surgeon: Jinny Carmine, MD;  Location: [facility] ENDOSCOPY;  Service: Endoscopy;  Laterality: N/A;   ESOPHAGOGASTRODUODENOSCOPY N/A 12/31/2022   Procedure: ESOPHAGOGASTRODUODENOSCOPY (EGD);  Surgeon: Jinny Carmine, MD;  Location[facility];  Service: Endoscopy;  Laterality: N/A;   ESOPHAGOGASTRODUODENOSCOPY (EGD) WITH PROPOFOL  N/A 12/13/2019   Procedure: ESOPHAGOGASTRODUODENOSCOPY (EGD) WITH BIOPSY;  Surgeon: Jinny Carmine, MD;  Location: [facility];  Service: Endoscopy;  Laterality: N/A;  Diabetic - oral meds   PLANTAR'S WART EXCISION     ROBOT ASSISTED LAPAROSCOPIC NEPHRECTOMY Right 08/10/2024   Procedure: NEPHRECTOMY, RADICAL, ROBOT-ASSISTED, LAPAROSCOPIC, ADULT;  Surgeon: Georganne Penne SAUNDERS, MD;  Location: [facility] ORS;  Service: Urology;  Laterality: Right;   STRABISMUS SURGERY Right     SOCIAL HISTORY: Social History   Socioeconomic History   Marital status: Married    Spouse name: Not on file   Number of children: 3   Years of education: Not on file   Highest education level: Bachelor's degree (e.g., BA, AB, BS)  Occupational History  Not on file  Tobacco Use   Smoking status: Former    Current packs/day: 0.00    Types: Cigarettes    Quit date: 1978    Years since quitting: 48.0   Smokeless tobacco: Never   Tobacco comments:    quit in 1978  Vaping Use   Vaping status: Never Used  Substance and Sexual Activity   Alcohol use: Yes    Alcohol/week: 6.0 standard drinks of alcohol    Types: 6 Cans of beer per week    Comment: a couple beers on the weekends   Drug use: No   Sexual activity: Not on file  Other Topics  Concern   Not on file  Social History Narrative   Not on file   Social Drivers of Health   Tobacco Use: Medium Risk (10/05/2024)   Patient History    Smoking Tobacco Use: Former    Smokeless Tobacco Use: Never    Passive Exposure: Not on file  Financial Resource Strain: Low Risk  (03/30/2024)   Received from [facility]   Overall Financial Resource Strain (CARDIA)    Difficulty of Paying Living Expenses: Not hard at all  Food Insecurity: No Food Insecurity (08/31/2024)   Epic    Worried About Radiation Protection Practitioner of Food in the Last Year: Never true    Ran Out of Food in the Last Year: Never true  Transportation Needs: No Transportation Needs (08/31/2024)   Epic    Lack of Transportation (Medical): No    Lack of Transportation (Non-Medical): No  Physical Activity: Sufficiently Active (03/18/2023)   Received from [facility]   Exercise Vital Sign    On average, how many days per week do you engage in moderate to strenuous exercise (like a brisk walk)?: 4 days    On average, how many minutes do you engage in exercise at this level?: 70 min  Stress: No Stress Concern Present (03/18/2023)   Received from [facility] - Occupational Stress Questionnaire    Feeling of Stress : Only a little  Social Connections: Socially Integrated (08/10/2024)   Social Connection and Isolation Panel    Frequency of Communication with Friends and Family: More than three times a week    Frequency of Social Gatherings with Friends and Family: More than three times a week    Attends Religious Services: More than 4 times per year    Active Member of Clubs or Organizations: Yes    Attends Banker Meetings: 1 to 4 times per year    Marital Status: Married  Catering Manager Violence: Not At Risk (08/31/2024)   Epic    Fear of Current or Ex-Partner: No    Emotionally Abused: No    Physically Abused: No     Sexually Abused: No  Depression (PHQ2-9): Low Risk (09/14/2024)   Depression (PHQ2-9)    PHQ-2 Score: 0  Alcohol Screen: Low Risk (11/14/2021)   Alcohol Screen    Last Alcohol Screening Score (AUDIT): 3  Housing: Low Risk (08/31/2024)   Epic    Unable to Pay for Housing in the Last Year: No    Number of Times Moved in the Last Year: 0    Homeless in the Last Year: No  Utilities: Not At Risk (08/31/2024)   Epic    Threatened with loss of utilities: No  Health Literacy: Adequate Health Literacy (03/18/2023)   Received from [facility]   B1300 Health Literacy    Frequency of need for help with medical instructions: Never    FAMILY HISTORY: Family History  Problem Relation Age of Onset   Diabetes Mother    Breast cancer Mother    Alcohol abuse Father    Heart disease Maternal Uncle    Rheumatic fever Maternal Uncle    Diabetes Paternal Grandmother    Emphysema Paternal Grandfather     ALLERGIES:  has no known allergies.  MEDICATIONS:  Current Outpatient Medications  Medication Sig Dispense Refill   atorvastatin  (LIPITOR) 20 MG tablet Take 1 tablet (20 mg total) by mouth daily. 90 tablet 3   Blood Glucose Monitoring Suppl (ACCU-CHEK NANO SMARTVIEW) w/Device KIT Use glucometer (Accu-Chek Nano Glucometer) to test fasting glucose daily. 1 kit 0   diltiazem  (CARDIZEM  CD) 120 MG 24 hr capsule Take 1 capsule (120 mg total) by mouth daily. 90 capsule 3   docusate sodium  (COLACE) 100 MG capsule Take 1 capsule (100 mg total) by mouth 2 (two) times daily. 10 capsule 0   empagliflozin  (JARDIANCE ) 25 MG TABS tablet Take 25 mg by mouth.     famotidine  (PEPCID ) 40 MG tablet Take 1 tablet (40 mg total) by mouth at bedtime. 90 tablet 3   fluticasone  (FLONASE ) 50 MCG/ACT nasal spray Place 1 spray into both nostrils daily as needed for allergies.     glucose blood (TRUE METRIX BLOOD GLUCOSE TEST) test strip Use as instructed 100 each 12   hyoscyamine  (LEVSIN  SL) 0.125 MG SL  tablet Place 0.125 mg under the tongue 2 (two) times daily as needed for cramping.     Lancets (ONETOUCH ULTRASOFT) lancets Use as instructed once a day for fasting glucose check. 100 each 3   losartan (COZAAR) 50 MG tablet Take 50 mg by mouth daily.     metFORMIN  (GLUCOPHAGE ) 1000 MG tablet Take 1 tablet (1,000 mg total) by mouth 2 (two) times daily with a meal. 180 tablet 0   Misc Natural Products (OSTEO BI-FLEX ADV DOUBLE ST) CAPS Take 1 capsule by mouth daily.     pantoprazole  (PROTONIX ) 40 MG tablet Take 1 tablet (40 mg total) by mouth daily. 90 tablet 3   pioglitazone  (ACTOS ) 15 MG tablet Take 1 tablet (15 mg total) by mouth daily. 90 tablet 0   Probiotic Product (PROBIOTIC FORMULA) CAPS Take 1 capsule by mouth daily.     prochlorperazine  (COMPAZINE ) 10 MG tablet Take 1 tablet (10 mg total) by mouth every 6 (six) hours as needed for nausea or vomiting. 30 tablet 1   sucralfate  (CARAFATE ) 1 g tablet TAKE 1 TABLET FOUR TIMES DAILY - WITH MEALS AND AT BEDTIME. 360 tablet 0   No current facility-administered medications for this visit.    Review of Systems  Constitutional:  Positive for fatigue. Negative for appetite change, chills and fever.  HENT:   Negative for hearing loss and voice change.   Eyes:  Negative for eye problems.  Respiratory:  Negative for chest tightness and cough.   Cardiovascular:  Negative for chest pain.  Gastrointestinal:  Negative for abdominal distention, abdominal pain and blood in stool.  Endocrine: Negative for hot flashes.  Genitourinary:  Negative for difficulty urinating and frequency.        Frothy urine  Musculoskeletal:  Negative for arthralgias.  Skin:  Negative for itching and rash.  Neurological:  Negative for extremity weakness.  Hematological:  Negative for adenopathy.  Psychiatric/Behavioral:  Negative for confusion.  PHYSICAL EXAMINATION: ECOG PERFORMANCE STATUS: 0 - Asymptomatic  Vitals:   10/05/24 1110  BP: 133/77  Pulse: (!) 59   Resp: 18  Temp: (!) 96.7 F (35.9 C)   Filed Weights   10/05/24 1110  Weight: 244 lb 8 oz (110.9 kg)    Physical Exam Constitutional:      General: He is not in acute distress.    Appearance: He is not diaphoretic.  HENT:     Head: Normocephalic and atraumatic.  Eyes:     General: No scleral icterus.    Comments: Right eye ptosis-chronic  Cardiovascular:     Rate and Rhythm: Normal rate and regular rhythm.  Pulmonary:     Effort: Pulmonary effort is normal. No respiratory distress.     Breath sounds: No wheezing.  Abdominal:     General: There is no distension.     Palpations: Abdomen is soft.     Tenderness: There is no abdominal tenderness.     Comments: Status post right nephrectomy  Musculoskeletal:        General: Normal range of motion.     Cervical back: Normal range of motion and neck supple.  Skin:    General: Skin is warm and dry.     Findings: No erythema.  Neurological:     Mental Status: He is alert and oriented to person, place, and time. Mental status is at baseline.     Motor: No abnormal muscle tone.  Psychiatric:        Mood and Affect: Mood and affect normal.      LABORATORY DATA:  I have reviewed the data as listed    Latest Ref Rng & Units 10/05/2024   11:02 AM 09/14/2024    8:50 AM 08/11/2024    6:00 AM  CBC  WBC 4.0 - 10.5 K/uL 6.1  5.2  8.8   Hemoglobin 13.0 - 17.0 g/dL 85.4  85.0  85.7   Hematocrit 39.0 - 52.0 % 40.6  42.1  40.6   Platelets 150 - 400 K/uL 260  192  225       Latest Ref Rng & Units 10/05/2024   11:01 AM 09/14/2024    8:50 AM 08/11/2024    6:00 AM  CMP  Glucose 70 - 99 mg/dL 864  733  756   BUN 8 - 23 mg/dL 19  25  15    Creatinine 0.61 - 1.24 mg/dL 8.53  8.29  8.61   Sodium 135 - 145 mmol/L 136  135  132   Potassium 3.5 - 5.1 mmol/L 4.0  4.9  4.2   Chloride 98 - 111 mmol/L 100  98  99   CO2 22 - 32 mmol/L 23  26  24    Calcium  8.9 - 10.3 mg/dL 9.1  9.6  8.4   Total Protein 6.5 - 8.1 g/dL 7.3  7.4    Total  Bilirubin 0.0 - 1.2 mg/dL 0.7  0.8    Alkaline Phos 38 - 126 U/L 91  96    AST 15 - 41 U/L 16  17    ALT 0 - 44 U/L 14  15       RADIOGRAPHIC STUDIES: I have personally reviewed the radiological images as listed and agreed with the findings in the report. No results found.   "

## 2024-10-05 NOTE — Assessment & Plan Note (Signed)
 Immunotherapy plan as listed above

## 2024-10-06 ENCOUNTER — Inpatient Hospital Stay: Admitting: Hospice and Palliative Medicine

## 2024-10-06 DIAGNOSIS — C641 Malignant neoplasm of right kidney, except renal pelvis: Secondary | ICD-10-CM

## 2024-10-06 NOTE — Progress Notes (Signed)
 Multidisciplinary Oncology Council Documentation  ADONAI SELSOR was presented by our [facility] on 10/06/2024, which included representatives from:  Palliative Care Dietitian  Physical/Occupational Therapist Nurse Navigator Genetics Social work Survivorship RN Financial Navigator Research RN   Takeshi currently presents with history of RCC  We reviewed previous medical and familial history, history of present illness, and recent lab results along with all available histopathologic and imaging studies. The MOC considered available treatment options and made the following recommendations/referrals:  Genetics  The MOC is a meeting of clinicians from various specialty areas who evaluate and discuss patients for whom a multidisciplinary approach is being considered. Final determinations in the plan of care are those of the provider(s).   Today's extended care, comprehensive team conference, Acie was not present for the discussion and was not examined.

## 2024-10-14 ENCOUNTER — Other Ambulatory Visit: Payer: Self-pay | Admitting: Licensed Clinical Social Worker

## 2024-10-14 ENCOUNTER — Encounter: Payer: Self-pay | Admitting: Oncology

## 2024-10-14 ENCOUNTER — Encounter: Payer: Self-pay | Admitting: Licensed Clinical Social Worker

## 2024-10-14 ENCOUNTER — Inpatient Hospital Stay

## 2024-10-14 ENCOUNTER — Inpatient Hospital Stay: Admitting: Licensed Clinical Social Worker

## 2024-10-14 DIAGNOSIS — Z1379 Encounter for other screening for genetic and chromosomal anomalies: Secondary | ICD-10-CM

## 2024-10-14 DIAGNOSIS — C641 Malignant neoplasm of right kidney, except renal pelvis: Secondary | ICD-10-CM

## 2024-10-14 DIAGNOSIS — Z803 Family history of malignant neoplasm of breast: Secondary | ICD-10-CM

## 2024-10-14 LAB — GENETIC SCREENING ORDER

## 2024-10-14 NOTE — Progress Notes (Signed)
 REFERRING PROVIDER: Babara Call, MD [address] [place],  [place] [address]  PRIMARY PROVIDER:  Bertrum Charlie CROME, MD  PRIMARY REASON FOR VISIT:  1. Renal cell carcinoma of right kidney (HCC)   2. Family history of breast cancer      HISTORY OF PRESENT ILLNESS:   Steven Miranda, a 79 y.o. male, was seen for a [facility] cancer genetics consultation at the request of Dr. Babara due to a personal and family history of cancer.  Steven Miranda presents to clinic today to discuss the possibility of a hereditary predisposition to cancer, genetic testing, and to further clarify his future cancer risks, as well as potential cancer risks for family members.   CANCER HISTORY:  Oncology History  Renal cell carcinoma (HCC)  04/01/2024 Imaging   Complete abdomen ultrasound showed 1. No acute abnormality. 2. Hepatic steatosis. 3. Complex right renal cyst measuring up to 7.0 cm. Further evaluation with nonemergent renal protocol CT or MRI with and without IV contrast is recommended.   05/18/2024 Imaging   MRI abdomen with and without contrast showed 1. 6.4 cm complex septated cystic lesion medially in the upper pole of the right kidney with thin enhancing septations in the superior component. This is consistent with an indeterminate cystic renal mass, best classified as Bosniak III. Urology consultation recommended. 2. Additional simple cyst in the interpolar region of the right kidney (Bosniak 1). No suspicious left renal lesions. 3. No adenopathy. 4. No acute abdominal findings   08/10/2024 Surgery   Patient underwent right radical nephrectomy  Pathology showed 1. Kidney, radical nephrectomy for tumor, right kidney :       RENAL CELL CARCINOMA, CLEAR-CELL TYPE, MULTICYSTIC, 4.5 CM.       TUMOR FOCALLY EXTENDS INTO RENAL SINUS ADIPOSE TISSUE.       NEGATIVE FOR LYMPHOVASCULAR INVASION.       ALL SURGICAL MARGINS NEGATIVE FOR TUMOR.       SEE ONCOLOGY TABLE AND COMMENT   KIDNEY: Nephrectomy  Procedure: Right nephrectomy Specimen Laterality: Right Tumor Size: 4.5 x 4 x 3 cm Tumor Focality: Unifocal Histologic Type: Multicystic clear-cell renal cell carcinoma Sarcomatoid Features: Not identified Rhabdoid Features: Not identified Histologic Grade: WHO grade 2 Tumor Necrosis: Minimal Tumor Extension: Into renal sinus adipose tissue Lymphatic and/or Vascular Invasion: Not identified Margins: All surgical margins negative for tumor Regional Lymph Nodes: No lymph nodes submitted Distant Metastasis: Distant Site(s) Involved: Not applicable Additional Findings in Nonneoplastic Kidney: No significant findings Pathologic Stage Classification (pTNM, AJCC 8th Edition): pT3a, pN not assigned Ancillary Studies: Immunohistochemistry, see comment Representative Tumor Block: 1D-1J Comment(s): The tumor is predominantly cystic with cyst lined by cells with clear cytoplasm. Immunohistochemistry shows the tumor cells are positive with CA-IX and cytokeratin 7 with mostly negative staining with CD10 and P504S. The morphologic features are consistent with clear-cell type renal cell carcinoma predominantly cystic which focally extends into renal sinus adipose tissue.   08/16/2024 Initial Diagnosis   Renal cell carcinoma (HCC)   08/31/2024 Cancer Staging   Staging form: Kidney, AJCC 8th Edition - Pathologic stage from 08/31/2024: Stage III (pT3a, pN0, cM0) - Signed by Babara Call, MD on 08/31/2024 Stage prefix: Initial diagnosis   09/14/2024 -  Chemotherapy   Patient is on Treatment Plan : Pembrolizumab  (200) q21d        Past Medical History:  Diagnosis Date   Actinic keratosis    Aortic atherosclerosis    Arthritis    right thumb   Ascending aorta dilatation  Barrett's esophagus    DDD (degenerative disc disease), thoracic    Diastolic dysfunction    Dysuria    GERD (gastroesophageal reflux disease)    Hepatic steatosis    Hyperlipidemia    Hypertension    Paroxysmal atrial fibrillation (HCC)    a.)  CHA2DS2VASc = 5 (age x2, HTN, vascular disease, T2DM) as of 08/04/2024; b.) rate/rhythm maintained on oral diltiazem ; no chronic OAC   Renal mass, right    T2DM (type 2 diabetes mellitus) (HCC)    Wears contact lenses     Past Surgical History:  Procedure Laterality Date   BROW LIFT Right 06/14/2022   Procedure: BLEPHAROPTOSIS REPAIR; RESECT EX RIGHT DIABETIC;  Surgeon: Ashley Greig HERO, MD;  Location: [facility];  Service: Ophthalmology;  Laterality: Right;   COLONOSCOPY  2005   COLONOSCOPY WITH PROPOFOL  N/A 10/16/2016   Procedure: COLONOSCOPY WITH PROPOFOL ;  Surgeon: Reyes LELON Cota, MD;  Location: [facility];  Service: Endoscopy;  Laterality: N/A;   COLONOSCOPY WITH PROPOFOL  N/A 12/25/2021   Procedure: COLONOSCOPY WITH PROPOFOL ;  Surgeon: Jinny Carmine, MD;  Location: [facility];  Service: Endoscopy;  Laterality: N/A;   COLONOSCOPY WITH PROPOFOL  N/A 12/31/2022   Procedure: COLONOSCOPY WITH PROPOFOL ;  Surgeon: Jinny Carmine, MD;  Location: [facility];  Service: Endoscopy;  Laterality: N/A;   ESOPHAGOGASTRODUODENOSCOPY N/A 12/31/2022   Procedure: ESOPHAGOGASTRODUODENOSCOPY (EGD);  Surgeon: Jinny Carmine, MD;  Location: [facility];  Service: Endoscopy;  Laterality: N/A;   ESOPHAGOGASTRODUODENOSCOPY (EGD) WITH PROPOFOL  N/A 12/13/2019   Procedure: ESOPHAGOGASTRODUODENOSCOPY (EGD) WITH BIOPSY;  Surgeon: Jinny Carmine, MD;  Location: [facility];  Service: Endoscopy;  Laterality: N/A;  Diabetic - oral meds   PLANTAR'S WART EXCISION     ROBOT ASSISTED LAPAROSCOPIC NEPHRECTOMY Right 08/10/2024   Procedure: NEPHRECTOMY, RADICAL, ROBOT-ASSISTED, LAPAROSCOPIC, ADULT;  Surgeon: Georganne Penne SAUNDERS, MD;  Location: [facility];  Service: Urology;  Laterality: Right;   STRABISMUS SURGERY Right     FAMILY HISTORY:  We obtained a detailed, 4-generation family history.  Significant diagnoses are listed below: Family History  Problem Relation Age of Onset   Diabetes Mother     Breast cancer Mother    Alcohol abuse Father    Heart disease Maternal Uncle    Rheumatic fever Maternal Uncle    Diabetes Paternal Grandmother    Emphysema Paternal Grandfather       Mother - breast cancer dx 62s, metastatic  Steven Miranda is unaware of previous family history of genetic testing for hereditary cancer risks. There is no reported Ashkenazi Jewish ancestry. There is no known consanguinity.  GENETIC COUNSELING ASSESSMENT: Steven Miranda is a 79 y.o. male with a personal history of kidney cancer which is somewhat suggestive of a hereditary cancer syndrome and predisposition to cancer. We, therefore, discussed and recommended the following at today's visit.   DISCUSSION: We discussed that, in general, most cancer is not inherited in families, but instead is sporadic or familial. Sporadic cancers occur by chance and typically happen at older ages (>50 years). Some families have more cancers than would be expected by chance; however, the ages or types of cancer are not consistent with a known genetic mutation or known genetic mutations have been ruled out. This type of familial cancer is thought to be due to a combination of multiple genetic, environmental, hormonal, and lifestyle factors. While this combination of factors likely increases the risk of cancer, the exact source of this risk is not currently identifiable or testable.    We discussed  that approximately 10% of cancer is hereditary. Some hereditary conditions that increase the risk for kidney cancer include von Hippel-Lindau (VHL) and tuberous sclerosis. There are other genes associated with hereditary cancer as well. Cancers and risks are gene specific. We discussed that testing is beneficial for several reasons including knowing about cancer risks, identifying potential screening and risk-reduction options that may be appropriate, and to understand if other family members could be at risk for cancer and allow them to undergo genetic  testing.   We reviewed the characteristics, features and inheritance patterns of hereditary cancer syndromes. We also discussed genetic testing, including the appropriate family members to test, the process of testing, insurance coverage and turn-around-time for results. We discussed the implications of a negative, positive and/or variant of uncertain significant result. Steven Miranda does not quite meet medical criteria for testing, but should he still like to pursue testing, we recommended Steven Miranda pursue genetic testing for the [facility] Multi-Cancer+RNA gene panel.   The Multi-Cancer + RNA Panel offered by [facility] includes sequencing and/or deletion/duplication analysis of the following 70 genes:  AIP*, ALK, APC*, ATM*, AXIN2*, BAP1*, BARD1*, BLM*, BMPR1A*, BRCA1*, BRCA2*, BRIP1*, CDC73*, CDH1*, CDK4, CDKN1B*, CDKN2A, CHEK2*, CTNNA1*, DICER1*, EPCAM, EGFR, FH*, FLCN*, GREM1, HOXB13, KIT, LZTR1, MAX*, MBD4, MEN1*, MET, MITF, MLH1*, MSH2*, MSH3*, MSH6*, MUTYH*, NF1*, NF2*, NTHL1*, PALB2*, PDGFRA, PMS2*, POLD1*, POLE*, POT1*, PRKAR1A*, PTCH1*, PTEN*, RAD51C*, RAD51D*, RB1*, RET, SDHA*, SDHAF2*, SDHB*, SDHC*, SDHD*, SMAD4*, SMARCA4*, SMARCB1*, SMARCE1*, STK11*, SUFU*, TMEM127*, TP53*, TSC1*, TSC2*, VHL*. RNA analysis is performed for * genes.   PLAN: After considering the risks, benefits, and limitations, Steven Miranda provided informed consent to pursue genetic testing and the blood sample was sent to [facility] for analysis of the Multi-Cancer+RNA Panel. Results should be available within approximately 2-3 weeks' time, at which point they will be disclosed by telephone to Steven Miranda, as will any additional recommendations warranted by these results. Steven Miranda will receive a summary of his genetic counseling visit and a copy of his results once available. This information will also be available in Epic.   Steven Miranda questions were answered to his satisfaction today. Our contact information was provided  should additional questions or concerns arise. Thank you for the referral and allowing us  to share in the care of your patient.   Dena Cary, MS, [facility] Genetic Counselor [email or web address] Phone: [phone]  I personally spent a total of 40 minutes in the care of the patient today including getting/reviewing separately obtained history, counseling and educating, placing orders, and documenting clinical information in the EHR.  Dr. Delinda was available for discussion regarding this case.   _______________________________________________________________________ For Office Staff:  Number of people involved in session: 2 - patient's wife was present.  Was an Intern/ student involved with case: no

## 2024-10-19 ENCOUNTER — Other Ambulatory Visit: Payer: Self-pay

## 2024-10-19 ENCOUNTER — Inpatient Hospital Stay

## 2024-10-19 ENCOUNTER — Inpatient Hospital Stay: Admitting: Nurse Practitioner

## 2024-10-19 VITALS — BP 148/88 | HR 70 | Temp 96.7°F | Resp 16 | Wt 244.7 lb

## 2024-10-19 DIAGNOSIS — C641 Malignant neoplasm of right kidney, except renal pelvis: Secondary | ICD-10-CM

## 2024-10-19 DIAGNOSIS — Z5112 Encounter for antineoplastic immunotherapy: Secondary | ICD-10-CM | POA: Diagnosis not present

## 2024-10-19 DIAGNOSIS — J3489 Other specified disorders of nose and nasal sinuses: Secondary | ICD-10-CM

## 2024-10-19 DIAGNOSIS — M791 Myalgia, unspecified site: Secondary | ICD-10-CM

## 2024-10-19 LAB — SEDIMENTATION RATE: Sed Rate: 35 mm/h — ABNORMAL HIGH (ref 0–20)

## 2024-10-19 LAB — CBC WITH DIFFERENTIAL (CANCER CENTER ONLY)
Abs Immature Granulocytes: 0.02 K/uL (ref 0.00–0.07)
Basophils Absolute: 0 K/uL (ref 0.0–0.1)
Basophils Relative: 1 %
Eosinophils Absolute: 0.1 K/uL (ref 0.0–0.5)
Eosinophils Relative: 2 %
HCT: 38.9 % — ABNORMAL LOW (ref 39.0–52.0)
Hemoglobin: 14.1 g/dL (ref 13.0–17.0)
Immature Granulocytes: 0 %
Lymphocytes Relative: 12 %
Lymphs Abs: 0.8 K/uL (ref 0.7–4.0)
MCH: 32.3 pg (ref 26.0–34.0)
MCHC: 36.2 g/dL — ABNORMAL HIGH (ref 30.0–36.0)
MCV: 89.2 fL (ref 80.0–100.0)
Monocytes Absolute: 0.7 K/uL (ref 0.1–1.0)
Monocytes Relative: 10 %
Neutro Abs: 4.9 K/uL (ref 1.7–7.7)
Neutrophils Relative %: 75 %
Platelet Count: 244 K/uL (ref 150–400)
RBC: 4.36 MIL/uL (ref 4.22–5.81)
RDW: 12.3 % (ref 11.5–15.5)
WBC Count: 6.5 K/uL (ref 4.0–10.5)
nRBC: 0 % (ref 0.0–0.2)

## 2024-10-19 LAB — CMP (CANCER CENTER ONLY)
ALT: 20 U/L (ref 0–44)
AST: 21 U/L (ref 15–41)
Albumin: 4.1 g/dL (ref 3.5–5.0)
Alkaline Phosphatase: 99 U/L (ref 38–126)
Anion gap: 12 (ref 5–15)
BUN: 18 mg/dL (ref 8–23)
CO2: 23 mmol/L (ref 22–32)
Calcium: 9 mg/dL (ref 8.9–10.3)
Chloride: 100 mmol/L (ref 98–111)
Creatinine: 1.3 mg/dL — ABNORMAL HIGH (ref 0.61–1.24)
GFR, Estimated: 57 mL/min — ABNORMAL LOW
Glucose, Bld: 146 mg/dL — ABNORMAL HIGH (ref 70–99)
Potassium: 4.2 mmol/L (ref 3.5–5.1)
Sodium: 134 mmol/L — ABNORMAL LOW (ref 135–145)
Total Bilirubin: 0.6 mg/dL (ref 0.0–1.2)
Total Protein: 7.2 g/dL (ref 6.5–8.1)

## 2024-10-19 LAB — CK: Total CK: 66 U/L (ref 49–397)

## 2024-10-19 LAB — C-REACTIVE PROTEIN: CRP: 2 mg/dL — ABNORMAL HIGH

## 2024-10-19 MED ORDER — PREDNISONE 20 MG PO TABS: 40.0000 mg | ORAL_TABLET | Freq: Every day | ORAL | 0 refills | Status: AC

## 2024-10-19 MED ORDER — METHOCARBAMOL 500 MG PO TABS: 500.0000 mg | ORAL_TABLET | Freq: Three times a day (TID) | ORAL | 0 refills | Status: AC | PRN

## 2024-10-19 NOTE — Progress Notes (Signed)
 Patient complains of joint and muscle pain at bil shoulders, groin to the knees.  Rates pain a 7-8.  Also states facial / sinus pain across his nose.  States pain stated after his treatment almost 2 weeks ago.

## 2024-10-19 NOTE — Progress Notes (Signed)
 " Hematology/Oncology Consult Note [phone] Fax:([phone]) [phone]     REFERRING PROVIDER: Dr. Georganne Riis   CHIEF COMPLAINTS/PURPOSE OF CONSULTATION:  Symptom management for myalgia and sinus pain   HISTORY OF PRESENTING ILLNESS:  Steven Miranda 79 y.o. male presents to establish care for right RCC I have reviewed his chart and materials related to his cancer extensively and collaborated history with the patient. Summary of oncologic history is as follows: Oncology History  Renal cell carcinoma (HCC)  04/01/2024 Imaging   Complete abdomen ultrasound showed 1. No acute abnormality. 2. Hepatic steatosis. 3. Complex right renal cyst measuring up to 7.0 cm. Further evaluation with nonemergent renal protocol CT or MRI with and without IV contrast is recommended.   05/18/2024 Imaging   MRI abdomen with and without contrast showed 1. 6.4 cm complex septated cystic lesion medially in the upper pole of the right kidney with thin enhancing septations in the superior component. This is consistent with an indeterminate cystic renal mass, best classified as Bosniak III. Urology consultation recommended. 2. Additional simple cyst in the interpolar region of the right kidney (Bosniak 1). No suspicious left renal lesions. 3. No adenopathy. 4. No acute abdominal findings   08/10/2024 Surgery   Patient underwent right radical nephrectomy  Pathology showed 1. Kidney, radical nephrectomy for tumor, right kidney :       RENAL CELL CARCINOMA, CLEAR-CELL TYPE, MULTICYSTIC, 4.5 CM.       TUMOR FOCALLY EXTENDS INTO RENAL SINUS ADIPOSE TISSUE.       NEGATIVE FOR LYMPHOVASCULAR INVASION.       ALL SURGICAL MARGINS NEGATIVE FOR TUMOR.       SEE ONCOLOGY TABLE AND COMMENT   KIDNEY: Nephrectomy Procedure: Right nephrectomy Specimen Laterality: Right Tumor Size: 4.5 x 4 x 3 cm Tumor Focality: Unifocal Histologic Type: Multicystic clear-cell renal cell carcinoma Sarcomatoid Features:  Not identified Rhabdoid Features: Not identified Histologic Grade: WHO grade 2 Tumor Necrosis: Minimal Tumor Extension: Into renal sinus adipose tissue Lymphatic and/or Vascular Invasion: Not identified Margins: All surgical margins negative for tumor Regional Lymph Nodes: No lymph nodes submitted Distant Metastasis: Distant Site(s) Involved: Not applicable Additional Findings in Nonneoplastic Kidney: No significant findings Pathologic Stage Classification (pTNM, AJCC 8th Edition): pT3a, pN not assigned Ancillary Studies: Immunohistochemistry, see comment Representative Tumor Block: 1D-1J Comment(s): The tumor is predominantly cystic with cyst lined by cells with clear cytoplasm. Immunohistochemistry shows the tumor cells are positive with CA-IX and cytokeratin 7 with mostly negative staining with CD10 and P504S. The morphologic features are consistent with clear-cell type renal cell carcinoma predominantly cystic which focally extends into renal sinus adipose tissue.   08/16/2024 Initial Diagnosis   Renal cell carcinoma (HCC)   08/31/2024 Cancer Staging   Staging form: Kidney, AJCC 8th Edition - Pathologic stage from 08/31/2024: Stage III (pT3a, pN0, cM0) - Signed by Babara Call, MD on 08/31/2024 Stage prefix: Initial diagnosis   09/14/2024 -  Chemotherapy   Patient is on Treatment Plan : Pembrolizumab  (200) q21d      Patient was accompanied by his spouse.  For a symptom management visit today with concerns of significant myalgia and facial sinus pain without signs of sinus infection/resp. Infection.  Overall he tolerated first cycle of Keytruda  well.  He reports that 2-3 days post cycle 2 of keytruda  he started having muscle soreness.  He reports significant muscle pain and soreness across both shoulders that radiate down into arms with stiffness of hands when first waking in the morning.  He also reports soreness across his pelvis that radiates down both legs into thighs.  Patient also reports sinus pain  across his face without any symptoms of sinus infection.  No fever/chills, no congestion, no cough.  Mr. Lightsey reports that he as attempted flonase  for sinus pain that has not helped as well as tylenol  for muscle pain with little relief as well.   Discussed case with Dr. Babara we are checking labs today that include cbc, cmp, esr, crp, and ck.    MEDICAL HISTORY:  Past Medical History:  Diagnosis Date   Actinic keratosis    Aortic atherosclerosis    Arthritis    right thumb   Ascending aorta dilatation    Barrett's esophagus    DDD (degenerative disc disease), thoracic    Diastolic dysfunction    Dysuria    GERD (gastroesophageal reflux disease)    Hepatic steatosis    Hyperlipidemia    Hypertension    Paroxysmal atrial fibrillation (HCC)    a.) CHA2DS2VASc = 5 (age x2, HTN, vascular disease, T2DM) as of 08/04/2024; b.) rate/rhythm maintained on oral diltiazem ; no chronic OAC   Renal mass, right    T2DM (type 2 diabetes mellitus) (HCC)    Wears contact lenses     SURGICAL HISTORY: Past Surgical History:  Procedure Laterality Date   BROW LIFT Right 06/14/2022   Procedure: BLEPHAROPTOSIS REPAIR; RESECT EX RIGHT DIABETIC;  Surgeon: Ashley Greig HERO, MD;  [facility];  Service: Ophthalmology;  Laterality: Right;   COLONOSCOPY  2005   COLONOSCOPY WITH PROPOFOL  N/A 10/16/2016   Procedure: COLONOSCOPY WITH PROPOFOL ;  Surgeon: Reyes LELON Cota, MD;  Location: [facility] ENDOSCOPY;  Service: Endoscopy;  Laterality: N/A;   COLONOSCOPY WITH PROPOFOL  N/A 12/25/2021   Procedure: COLONOSCOPY WITH PROPOFOL ;  Surgeon: Jinny Carmine, MD;  Location: [facility] ENDOSCOPY;  Service: Endoscopy;  Laterality: N/A;   COLONOSCOPY WITH PROPOFOL  N/A 12/31/2022   Procedure: COLONOSCOPY WITH PROPOFOL ;  Surgeon: Jinny Carmine, MD;  Location: [facility] ENDOSCOPY;  Service: Endoscopy;  Laterality: N/A;   ESOPHAGOGASTRODUODENOSCOPY N/A 12/31/2022   Procedure: ESOPHAGOGASTRODUODENOSCOPY (EGD);  Surgeon: Jinny Carmine,  MD;  Location: [facility] ENDOSCOPY;  Service: Endoscopy;  Laterality: N/A;   ESOPHAGOGASTRODUODENOSCOPY (EGD) WITH PROPOFOL  N/A 12/13/2019   Procedure: ESOPHAGOGASTRODUODENOSCOPY (EGD) WITH BIOPSY;  Surgeon: Jinny Carmine, MD;  Location: [facility];  Service: Endoscopy;  Laterality: N/A;  Diabetic - oral meds   PLANTAR'S WART EXCISION     ROBOT ASSISTED LAPAROSCOPIC NEPHRECTOMY Right 08/10/2024   Procedure: NEPHRECTOMY, RADICAL, ROBOT-ASSISTED, LAPAROSCOPIC, ADULT;  Surgeon: Georganne Penne SAUNDERS, MD;  Location: [facility] ORS;  Service: Urology;  Laterality: Right;   STRABISMUS SURGERY Right     SOCIAL HISTORY: Social History   Socioeconomic History   Marital status: Married    Spouse name: Not on file   Number of children: 3   Years of education: Not on file   Highest education level: Bachelor's degree (e.g., BA, AB, BS)  Occupational History   Not on file  Tobacco Use   Smoking status: Former    Current packs/day: 0.00    Types: Cigarettes    Quit date: 1978    Years since quitting: 48.0   Smokeless tobacco: Never   Tobacco comments:    quit in 1978  Vaping Use   Vaping status: Never Used  Substance and Sexual Activity   Alcohol use: Yes    Alcohol/week: 6.0 standard drinks of alcohol    Types: 6 Cans of beer per week  Comment: a couple beers on the weekends   Drug use: No   Sexual activity: Not on file  Other Topics Concern   Not on file  Social History Narrative   Not on file   Social Drivers of Health   Tobacco Use: Medium Risk (10/05/2024)   Patient History    Smoking Tobacco Use: Former    Smokeless Tobacco Use: Never    Passive Exposure: Not on file  Financial Resource Strain: Low Risk  (03/30/2024)   Received from [facility]   Overall Financial Resource Strain (CARDIA)    Difficulty of Paying Living Expenses: Not hard at all  Food Insecurity: No Food Insecurity (08/31/2024)   Epic    Worried About Radiation Protection Practitioner of Food in the Last Year: Never  true    Ran Out of Food in the Last Year: Never true  Transportation Needs: No Transportation Needs (08/31/2024)   Epic    Lack of Transportation (Medical): No    Lack of Transportation (Non-Medical): No  Physical Activity: Sufficiently Active (03/18/2023)   Received from [facility]   Exercise Vital Sign    On average, how many days per week do you engage in moderate to strenuous exercise (like a brisk walk)?: 4 days    On average, how many minutes do you engage in exercise at this level?: 70 min  Stress: No Stress Concern Present (03/18/2023)   Received from [facility] - Occupational Stress Questionnaire    Feeling of Stress : Only a little  Social Connections: Socially Integrated (08/10/2024)   Social Connection and Isolation Panel    Frequency of Communication with Friends and Family: More than three times a week    Frequency of Social Gatherings with Friends and Family: More than three times a week    Attends Religious Services: More than 4 times per year    Active Member of Clubs or Organizations: Yes    Attends Banker Meetings: 1 to 4 times per year    Marital Status: Married  Catering Manager Violence: Not At Risk (08/31/2024)   Epic    Fear of Current or Ex-Partner: No    Emotionally Abused: No    Physically Abused: No    Sexually Abused: No  Depression (PHQ2-9): Low Risk (10/19/2024)   Depression (PHQ2-9)    PHQ-2 Score: 0  Alcohol Screen: Low Risk (11/14/2021)   Alcohol Screen    Last Alcohol Screening Score (AUDIT): 3  Housing: Low Risk (08/31/2024)   Epic    Unable to Pay for Housing in the Last Year: No    Number of Times Moved in the Last Year: 0    Homeless in the Last Year: No  Utilities: Not At Risk (08/31/2024)   Epic    Threatened with loss of utilities: No  Health Literacy: Adequate Health Literacy (03/18/2023)   Received from [facility]   [id number]  Health Literacy    Frequency of need for help with medical instructions: Never    FAMILY HISTORY: Family History  Problem Relation Age of Onset   Diabetes Mother    Breast cancer Mother        dx 69s, metastatic   Alcohol abuse Father    Heart disease Maternal Uncle    Rheumatic fever Maternal Uncle    Diabetes Paternal Grandmother    Emphysema Paternal Grandfather     ALLERGIES:  has no known  allergies.  MEDICATIONS:  Current Outpatient Medications  Medication Sig Dispense Refill   atorvastatin  (LIPITOR) 20 MG tablet Take 1 tablet (20 mg total) by mouth daily. 90 tablet 3   Blood Glucose Monitoring Suppl (ACCU-CHEK NANO SMARTVIEW) w/Device KIT Use glucometer (Accu-Chek Nano Glucometer) to test fasting glucose daily. 1 kit 0   diltiazem  (CARDIZEM  CD) 120 MG 24 hr capsule Take 1 capsule (120 mg total) by mouth daily. 90 capsule 3   docusate sodium  (COLACE) 100 MG capsule Take 1 capsule (100 mg total) by mouth 2 (two) times daily. 10 capsule 0   empagliflozin  (JARDIANCE ) 25 MG TABS tablet Take 25 mg by mouth.     fluticasone  (FLONASE ) 50 MCG/ACT nasal spray Place 1 spray into both nostrils daily as needed for allergies.     glucose blood (TRUE METRIX BLOOD GLUCOSE TEST) test strip Use as instructed 100 each 12   hyoscyamine  (LEVSIN  SL) 0.125 MG SL tablet Place 0.125 mg under the tongue 2 (two) times daily as needed for cramping.     Lancets (ONETOUCH ULTRASOFT) lancets Use as instructed once a day for fasting glucose check. 100 each 3   losartan (COZAAR) 50 MG tablet Take 50 mg by mouth daily.     metFORMIN  (GLUCOPHAGE ) 1000 MG tablet Take 1 tablet (1,000 mg total) by mouth 2 (two) times daily with a meal. 180 tablet 0   methocarbamol  (ROBAXIN ) 500 MG tablet Take 1 tablet (500 mg total) by mouth every 8 (eight) hours as needed for up to 14 days for muscle spasms. 42 tablet 0   Misc Natural Products (OSTEO BI-FLEX ADV DOUBLE ST) CAPS Take 1 capsule by mouth daily.     pantoprazole   (PROTONIX ) 40 MG tablet Take 1 tablet (40 mg total) by mouth daily. 90 tablet 3   pioglitazone  (ACTOS ) 15 MG tablet Take 1 tablet (15 mg total) by mouth daily. 90 tablet 0   predniSONE  (DELTASONE ) 20 MG tablet Take 2 tablets (40 mg total) by mouth daily with breakfast for 5 days. 10 tablet 0   Probiotic Product (PROBIOTIC FORMULA) CAPS Take 1 capsule by mouth daily.     prochlorperazine  (COMPAZINE ) 10 MG tablet Take 1 tablet (10 mg total) by mouth every 6 (six) hours as needed for nausea or vomiting. 30 tablet 1   sucralfate  (CARAFATE ) 1 g tablet TAKE 1 TABLET FOUR TIMES DAILY - WITH MEALS AND AT BEDTIME. 360 tablet 0   famotidine  (PEPCID ) 40 MG tablet Take 1 tablet (40 mg total) by mouth at bedtime. (Patient not taking: Reported on 10/19/2024) 90 tablet 3   No current facility-administered medications for this visit.    Review of Systems  Constitutional:  Positive for fatigue. Negative for appetite change, chills and fever.  HENT:   Negative for hearing loss and voice change.        Sinus pain  Eyes:  Negative for eye problems.  Respiratory:  Negative for chest tightness and cough.   Cardiovascular:  Negative for chest pain.  Gastrointestinal:  Negative for abdominal distention, abdominal pain and blood in stool.  Endocrine: Negative for hot flashes.  Genitourinary:  Negative for difficulty urinating and frequency.        Frothy urine  Musculoskeletal:  Positive for arthralgias and myalgias.  Skin:  Negative for itching and rash.  Neurological:  Positive for extremity weakness.  Hematological:  Negative for adenopathy.  Psychiatric/Behavioral:  Negative for confusion.      PHYSICAL EXAMINATION: ECOG PERFORMANCE STATUS: 0 - Asymptomatic  Vitals:   10/19/24 1105  BP: (!) 148/88  Pulse: 70  Resp: 16  Temp: (!) 96.7 F (35.9 C)  SpO2: 94%   Filed Weights   10/19/24 1105  Weight: 244 lb 11.2 oz (111 kg)    Physical Exam Constitutional:      General: He is not in acute  distress.    Appearance: He is not diaphoretic.  HENT:     Head: Normocephalic and atraumatic.  Eyes:     General: No scleral icterus.    Comments: Right eye ptosis-chronic  Cardiovascular:     Rate and Rhythm: Normal rate and regular rhythm.  Pulmonary:     Effort: Pulmonary effort is normal. No respiratory distress.     Breath sounds: No wheezing.  Abdominal:     General: There is no distension.     Palpations: Abdomen is soft.     Tenderness: There is no abdominal tenderness.     Comments: Status post right nephrectomy  Musculoskeletal:        General: Tenderness present.     Cervical back: Normal range of motion and neck supple.  Skin:    General: Skin is warm and dry.     Findings: No erythema.  Neurological:     Mental Status: He is alert and oriented to person, place, and time. Mental status is at baseline.     Motor: No abnormal muscle tone.  Psychiatric:        Mood and Affect: Mood and affect normal.      LABORATORY DATA:  I have reviewed the data as listed    Latest Ref Rng & Units 10/19/2024   11:38 AM 10/05/2024   11:02 AM 09/14/2024    8:50 AM  CBC  WBC 4.0 - 10.5 K/uL 6.5  6.1  5.2   Hemoglobin 13.0 - 17.0 g/dL 85.8  85.4  85.0   Hematocrit 39.0 - 52.0 % 38.9  40.6  42.1   Platelets 150 - 400 K/uL 244  260  192       Latest Ref Rng & Units 10/19/2024   11:38 AM 10/05/2024   11:01 AM 09/14/2024    8:50 AM  CMP  Glucose 70 - 99 mg/dL 853  864  733   BUN 8 - 23 mg/dL 18  19  25    Creatinine 0.61 - 1.24 mg/dL 8.69  8.53  8.29   Sodium 135 - 145 mmol/L 134  136  135   Potassium 3.5 - 5.1 mmol/L 4.2  4.0  4.9   Chloride 98 - 111 mmol/L 100  100  98   CO2 22 - 32 mmol/L 23  23  26    Calcium  8.9 - 10.3 mg/dL 9.0  9.1  9.6   Total Protein 6.5 - 8.1 g/dL 7.2  7.3  7.4   Total Bilirubin 0.0 - 1.2 mg/dL 0.6  0.7  0.8   Alkaline Phos 38 - 126 U/L 99  91  96   AST 15 - 41 U/L 21  16  17    ALT 0 - 44 U/L 20  14  15     Lab Results  Component Value Date    CKTOTAL 66 10/19/2024   TROPONINI <0.03 07/03/2018     RADIOGRAPHIC STUDIES: I have personally reviewed the radiological images as listed and agreed with the findings in the report. No results found.  ASSESSMENT & PLAN:    Renal cell carcinoma (HCC) pT3a pNx stage III clear-cell RCC, G2, status  post right radical nephrectomy. UISS prognostic model intermediate-high risk recommend 1 year of pembrolizumab  every 3 weeks [17 cycles] based on data from phase 3 clinical trial Keynote 564.     Completed cycle 2 of keytruda  10/05/24  Myalgia:  Patient reports significant muscle pain/aches/weakness across bilateral shoulders radiating down both arms causing weakness and stiffness in hands as well as in pelvis radiating down both thighs that started 2-3 days post cycle 2 of keytruda .  He reports that the pain is 7-8/10 and stiffness in hands worse in AM.  Tylenol  not helping with pain. Today I have ordered cbc, cmp, ck, ESR, CRP.  Discussed treatment plan with Dr. Babara.  I have prescribed 5 days of prednisone  as well as Methocarbamol  500 mg Q8H PRN to help with myalgia.  Instructed the patient to call with any fever/chills, worsening pain.  Facial sinus pain: He reports pain across his facial sinuses that started post cycle 2 of keytruda  without any s/sx of infection, no fever/chills, no nasal congestion, no ear pain today and ear examination with otoscope today benign.  He reports that he used flonase  without relief.  Prednisone  prescribed today.  He will f/u with Dr. Babara next week.  Instructed patient to call back with any s/sx of infection.   CKD (chronic kidney disease) stage 3, GFR 30-59 ml/min (HCC) Kidney function has decreased since his right nephrectomy. Encourage oral hydration and avoid nephrotoxins.   Frothy urine could be due to proteinuria.  She plans to further discuss with primary care provider/nephrology evaluation.  No dysuria/increased urgency. Stable creatinine 1.30 today   Follow up  plan:  Sent prednisone  and methocarbamol  rx F/U with Dr. Babara per [facility]    Morna Husband AGNP-C 10/19/24   "

## 2024-10-22 ENCOUNTER — Inpatient Hospital Stay

## 2024-10-22 ENCOUNTER — Other Ambulatory Visit: Payer: Self-pay | Admitting: Nurse Practitioner

## 2024-10-22 ENCOUNTER — Telehealth: Payer: Self-pay | Admitting: *Deleted

## 2024-10-22 DIAGNOSIS — M791 Myalgia, unspecified site: Secondary | ICD-10-CM

## 2024-10-22 DIAGNOSIS — C641 Malignant neoplasm of right kidney, except renal pelvis: Secondary | ICD-10-CM

## 2024-10-22 DIAGNOSIS — Z5112 Encounter for antineoplastic immunotherapy: Secondary | ICD-10-CM | POA: Diagnosis not present

## 2024-10-22 MED ORDER — AMOXICILLIN-POT CLAVULANATE 875-125 MG PO TABS: 1.0000 | ORAL_TABLET | Freq: Two times a day (BID) | ORAL | 0 refills | Status: AC

## 2024-10-22 NOTE — Telephone Encounter (Signed)
 Caller verified using pt's full name and dob prior to discussing PHI      NURSING SYMPTOM TRIAGE ASSESSMENT & DISPOSITION  Mode of interaction:  Telephone Date of symptom triage interaction:  10/22/24 Time of symptom triage interaction:  0939  Cancer diagnosis:  No diagnosis found. Patient is on Treatment Plan :  Pembrolizumab  (200) q21d  Last treatment date:  10/05/2024    Symptom Triage Protocol:   History of the problem:   Current symptoms: Nasal pressure  Onset: S/p cycle 2 of keytruda   Duration:   Current temperature: 97, Route taken:  temporal  Relieving factors: No relief with current   Provoking factors: none  Changes in ADLs: no     Additional commentary:  Patient called back to report ongoing sinus pressure despite taking prednisone  as prescribed. He stated that he has had minimal relief and is requesting a different medication to be called into his pharmacy for symptom mgmt. Denies fever, shortness of breath, chest pain, or other acute concerns at this time. He is using warm compresses on his face, but is not getting immediate relief. He is not using any antihistamine or decongestant otc.  RN also advised patient to use humidification and use his Flonase  as previously directed. He was instructed to also use nasal spray as needed as well and to test for covid/flu with OTC kit. Patient replied, I don't have covid/flu.       Triage Nurse Guidance Severity Index  Temperature higher than 102F (38.9C) without suspected neutropenia Severe flu-like symptom cluster, limiting ability to carry out activities of daily living Change in mental status Seek emergency care.  Symptoms not relieved by current homecare instructions within 24 hours Seek urgent care within 24 hours.  If flu-like syndrome is expected from the current therapy Follow homecare instructions. Notify MD if no improvement.   Provider Consulted  Provider name and credentials: Spoke with Morna, NP   Provider instruction: She will send antibiotic to his pharmacy to cover the possible sinus infection.  Patients should take Flonase  and NS sprays as directed and complete his course of prednisone . Likely not covid/flu per Morna, NP. May be r/t to keytruda .     Patient Instruction  Disclaimer:  Patient specific and Elsevier instruction provided verbally and sent through MyChart for active users.    Fever (non-neutropenic) Use nonsteroidal anti-inflammatory drugs such as acetaminophen  or ibupro-fen unless contraindicated. Aspirin  can also be used by adults if platelet count is above 50,000/mm3 Try tepid baths or ice packs as tolerated. Maintain hydration.  Chills For severe chills, a prescription for a narcotic such as meperidine or hydro-morphone may be necessary. Use comfort measures such as warm clothing, blankets, or a hot water  bottle (use caution in areas treated with radiation therapy and risk of burns).  Myalgia and headache Use nonsteroidal anti-inflammatory drugs such as acetaminophen  or ibuprofen unless contraindicated. Opioids and prednisone  may be prescribed. Apply heat or cold on muscles. Apply a cool cloth on forehead. Rest in a quiet, dimly lit room. Apply heat and massage for headaches at the back of the head.  Malaise/fatigue Balance periods of activity with rest periods. Eat a balanced diet.  Cough Use an antitussive such as codeine, dextromethorphan, or benzonatate. Use a decongestant if needed. Use an antihistamine if experiencing a runny nose.  Use a humidifier if experiencing a dry cough.  Seek Emergency Care Immediately if Any of the Following Occurs: Elevated temperature for more than three days Vomiting Seizures Changes in vision Change in  mental status  Teach Back Method used:  Yes    Nurse Triage Priority:  Non-urgent (review and reinforce self-care strategies)  Barriers to Care:  None identified  Nurse Triage Disposition:  Home care,  return call if no improvement within 24 hours. Patient given provider's recommendations. pt educated about after hours call and virtual visit capability if his symptoms do not improve.   Protocol Source:  Ruthine HERO., & Eldonna CANDIE Pee.). (2019). Telephone triage for oncology nurses (3rd ed.). Oncology Nursing Society.

## 2024-10-22 NOTE — Telephone Encounter (Signed)
 Per v/o Morna- Dr. Babara would like ana, CCP, RF, check anti ssa to be drawn today in the clinic to r/o cause of myalgia. She also wanted me to check to see if patient reports dry mouth or dry eyes. I called patient back. He would like to come today for the lab panels. He does report intermittent dry mouth/dry eyes.

## 2024-10-23 LAB — SJOGREN'S SYNDROME ANTIBODS(SSA + SSB)
SSA (Ro) (ENA) Antibody, IgG: <0.2 AI (ref 0.0–0.9)
SSB (La) (ENA) Antibody, IgG: <0.2 AI (ref 0.0–0.9)

## 2024-10-23 LAB — RHEUMATOID FACTOR: Rheumatoid fact SerPl-aCnc: 14.2 [IU]/mL — ABNORMAL HIGH

## 2024-10-23 LAB — CYCLIC CITRUL PEPTIDE ANTIBODY, IGG/IGA: CCP Antibodies IgG/IgA: 11 U (ref 0–19)

## 2024-10-23 LAB — ANA W/REFLEX: Anti Nuclear Antibody (ANA): NEGATIVE

## 2024-10-25 ENCOUNTER — Telehealth: Payer: Self-pay | Admitting: Oncology

## 2024-10-25 NOTE — Telephone Encounter (Signed)
 Due to weather, [facility] is on a delayed opening on 1/27. I called and spoke with pt and confirmed new appt time.

## 2024-10-26 ENCOUNTER — Inpatient Hospital Stay: Admitting: Oncology

## 2024-10-26 ENCOUNTER — Telehealth: Payer: Self-pay | Admitting: Oncology

## 2024-10-26 ENCOUNTER — Inpatient Hospital Stay

## 2024-10-26 ENCOUNTER — Encounter: Payer: Self-pay | Admitting: Oncology

## 2024-10-26 VITALS — BP 111/70 | HR 75 | Temp 97.6°F | Resp 20 | Wt 242.9 lb

## 2024-10-26 DIAGNOSIS — M791 Myalgia, unspecified site: Secondary | ICD-10-CM

## 2024-10-26 DIAGNOSIS — Z5112 Encounter for antineoplastic immunotherapy: Secondary | ICD-10-CM | POA: Diagnosis not present

## 2024-10-26 DIAGNOSIS — C641 Malignant neoplasm of right kidney, except renal pelvis: Secondary | ICD-10-CM | POA: Diagnosis not present

## 2024-10-26 DIAGNOSIS — M2550 Pain in unspecified joint: Secondary | ICD-10-CM | POA: Diagnosis not present

## 2024-10-26 DIAGNOSIS — N1832 Chronic kidney disease, stage 3b: Secondary | ICD-10-CM | POA: Diagnosis not present

## 2024-10-26 LAB — CMP (CANCER CENTER ONLY)
ALT: 17 U/L (ref 0–44)
AST: 15 U/L (ref 15–41)
Albumin: 4.3 g/dL (ref 3.5–5.0)
Alkaline Phosphatase: 98 U/L (ref 38–126)
Anion gap: 11 (ref 5–15)
BUN: 25 mg/dL — ABNORMAL HIGH (ref 8–23)
CO2: 26 mmol/L (ref 22–32)
Calcium: 9.2 mg/dL (ref 8.9–10.3)
Chloride: 95 mmol/L — ABNORMAL LOW (ref 98–111)
Creatinine: 1.56 mg/dL — ABNORMAL HIGH (ref 0.61–1.24)
GFR, Estimated: 45 mL/min — ABNORMAL LOW
Glucose, Bld: 353 mg/dL — ABNORMAL HIGH (ref 70–99)
Potassium: 4.5 mmol/L (ref 3.5–5.1)
Sodium: 132 mmol/L — ABNORMAL LOW (ref 135–145)
Total Bilirubin: 0.7 mg/dL (ref 0.0–1.2)
Total Protein: 7.2 g/dL (ref 6.5–8.1)

## 2024-10-26 LAB — CBC WITH DIFFERENTIAL (CANCER CENTER ONLY)
Abs Immature Granulocytes: 0.05 10*3/uL (ref 0.00–0.07)
Basophils Absolute: 0 10*3/uL (ref 0.0–0.1)
Basophils Relative: 0 %
Eosinophils Absolute: 0.1 10*3/uL (ref 0.0–0.5)
Eosinophils Relative: 1 %
HCT: 42.5 % (ref 39.0–52.0)
Hemoglobin: 15 g/dL (ref 13.0–17.0)
Immature Granulocytes: 1 %
Lymphocytes Relative: 10 %
Lymphs Abs: 0.8 10*3/uL (ref 0.7–4.0)
MCH: 32.1 pg (ref 26.0–34.0)
MCHC: 35.3 g/dL (ref 30.0–36.0)
MCV: 90.8 fL (ref 80.0–100.0)
Monocytes Absolute: 0.8 10*3/uL (ref 0.1–1.0)
Monocytes Relative: 9 %
Neutro Abs: 6.9 10*3/uL (ref 1.7–7.7)
Neutrophils Relative %: 79 %
Platelet Count: 326 10*3/uL (ref 150–400)
RBC: 4.68 MIL/uL (ref 4.22–5.81)
RDW: 12.4 % (ref 11.5–15.5)
WBC Count: 8.7 10*3/uL (ref 4.0–10.5)
nRBC: 0 % (ref 0.0–0.2)

## 2024-10-26 LAB — TSH: TSH: 3.02 u[IU]/mL (ref 0.350–4.500)

## 2024-10-26 NOTE — Telephone Encounter (Signed)
 Pt called to r/s lab/MD/inf appts from 2/10 due to having another appt that day - talked w/Yu team and r/s appts w/pt per IS tolerance - pt confirmed new date/time - LH

## 2024-10-26 NOTE — Progress Notes (Signed)
 " Hematology/Oncology Consult Note [phone] [phone]     REFERRING PROVIDER: Dr. Georganne Riis   CHIEF COMPLAINTS/PURPOSE OF CONSULTATION:  Right RCC  ASSESSMENT & PLAN:   Renal cell carcinoma (HCC) pT3a pNx stage III clear-cell RCC, G2, status post right radical nephrectomy. UISS prognostic model intermediate-high risk recommend 1 year of pembrolizumab  every 3 weeks [17 cycles] based on data from phase 3 clinical trial Keynote 564.    Labs reviewed and discussed with patient.  Hold off cycle 3 Keytruda  given that he was recently treated with prednisone .  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  Continuing Keytruda  may further flareup his symptoms which require high-dose prednisone .  Recommend patient to establish care with rheumatology for evaluation.  Referrals were sent.  Meanwhile, I will see him back in 2 weeks, try Keytruda  with low-dose prednisone  5 to 10 mg daily and see if this may help him to cope with the arthritic pain.    CKD (chronic kidney disease) stage 3, GFR 30-59 ml/min (HCC) Kidney function has decreased since his right nephrectomy. Encourage oral hydration and avoid nephrotoxins.     Arthralgia Possible inflammatory arthritis secondary to immunotherapy. Negative ANA, RF is mildly elevated 14.2, negative anti-SSA/SSB, CCP negative, CRP mildly elevated at 2, ESR elevated 35, CK 66 normal. Less likely he has myositis.  Possibly arthralgia due to inflammatory arthritis secondary to Keytruda .  Refer to rheumatology.  NSAIDs are contraindicated due to CKD    Orders Placed This Encounter  Procedures   CBC with Differential (Cancer Center Only)    Standing Status:   Future    Expected Date:   11/09/2024    Expiration Date:   11/09/2025   CMP (Cancer Center only)    Standing Status:   Future    Expected Date:   11/09/2024    Expiration Date:   11/09/2025   Ambulatory referral to  Rheumatology    Referral Priority:   Routine    Referral Type:   Consultation    Referral Reason:   Specialty Services Required    Requested Specialty:   Rheumatology    Number of Visits Requested:   1   Follow-up 2 weeks All questions were answered. The patient knows to call the clinic with any problems, questions or concerns.  Zelphia Cap, MD, PhD [facility] Hematology Oncology 10/26/2024    HISTORY OF PRESENTING ILLNESS:  Steven Miranda 79 y.o. male presents to establish care for right RCC I have reviewed his chart and materials related to his cancer extensively and collaborated history with the patient. Summary of oncologic history is as follows: Oncology History  Renal cell carcinoma (HCC)  04/01/2024 Imaging   Complete abdomen ultrasound showed 1. No acute abnormality. 2. Hepatic steatosis. 3. Complex right renal cyst measuring up to 7.0 cm. Further evaluation with nonemergent renal protocol CT or MRI with and without IV contrast is recommended.   05/18/2024 Imaging   MRI abdomen with and without contrast showed 1. 6.4 cm complex septated cystic lesion medially in the upper pole of the right kidney with thin enhancing septations in the superior component. This is consistent with an indeterminate cystic renal mass, best classified as Bosniak III. Urology consultation recommended. 2. Additional simple cyst in the interpolar region of the right kidney (Bosniak 1). No suspicious left renal lesions. 3. No adenopathy. 4. No acute abdominal findings   08/10/2024 Surgery   Patient underwent right radical nephrectomy  Pathology showed 1. Kidney, radical nephrectomy  for tumor, right kidney :       RENAL CELL CARCINOMA, CLEAR-CELL TYPE, MULTICYSTIC, 4.5 CM.       TUMOR FOCALLY EXTENDS INTO RENAL SINUS ADIPOSE TISSUE.       NEGATIVE FOR LYMPHOVASCULAR INVASION.       ALL SURGICAL MARGINS NEGATIVE FOR TUMOR.       SEE ONCOLOGY TABLE AND COMMENT   KIDNEY: Nephrectomy Procedure:  Right nephrectomy Specimen Laterality: Right Tumor Size: 4.5 x 4 x 3 cm Tumor Focality: Unifocal Histologic Type: Multicystic clear-cell renal cell carcinoma Sarcomatoid Features: Not identified Rhabdoid Features: Not identified Histologic Grade: WHO grade 2 Tumor Necrosis: Minimal Tumor Extension: Into renal sinus adipose tissue Lymphatic and/or Vascular Invasion: Not identified Margins: All surgical margins negative for tumor Regional Lymph Nodes: No lymph nodes submitted Distant Metastasis: Distant Site(s) Involved: Not applicable Additional Findings in Nonneoplastic Kidney: No significant findings Pathologic Stage Classification (pTNM, AJCC 8th Edition): pT3a, pN not assigned Ancillary Studies: Immunohistochemistry, see comment Representative Tumor Block: 1D-1J Comment(s): The tumor is predominantly cystic with cyst lined by cells with clear cytoplasm. Immunohistochemistry shows the tumor cells are positive with CA-IX and cytokeratin 7 with mostly negative staining with CD10 and P504S. The morphologic features are consistent with clear-cell type renal cell carcinoma predominantly cystic which focally extends into renal sinus adipose tissue.   08/16/2024 Initial Diagnosis   Renal cell carcinoma (HCC)   08/31/2024 Cancer Staging   Staging form: Kidney, AJCC 8th Edition - Pathologic stage from 08/31/2024: Stage III (pT3a, pN0, cM0) - Signed by Babara Call, MD on 08/31/2024 Stage prefix: Initial diagnosis   09/14/2024 -  Chemotherapy   Patient is on Treatment Plan : Pembrolizumab  (200) q21d      Discussed the use of AI scribe software for clinical note transcription with the patient, who gave verbal consent to proceed.   After cycle 2 keytruda . Patient experienced significant muscle pain and soreness across both shoulders that radiate down into arms with stiffness of hands when first waking in the morning.  He also reports soreness across his pelvis that radiates down both legs into thighs.  Patient also  reports sinus pain across his face without any symptoms of sinus infection.  Pain was not improved with taking over-the-counter Tylenol  as needed. Symptoms were described as severe and debilitating, interfering with sleep and daily activities. He denies prior joint stiffness, swelling, or autoimmune disease, and there was no joint swelling during these episodes. Patient was seen by symptom manage clinic.  He was treated with a 5 days course of prednisone  Patient reports that joint pain improved after taking 1 day prednisone .  He finished prednisone  3 days ago. Patient presented to discuss Keytruda  management.  Inflammatory markers were sent. MEDICAL HISTORY:  Past Medical History:  Diagnosis Date   Actinic keratosis    Aortic atherosclerosis    Arthritis    right thumb   Ascending aorta dilatation    Barrett's esophagus    DDD (degenerative disc disease), thoracic    Diastolic dysfunction    Dysuria    GERD (gastroesophageal reflux disease)    Hepatic steatosis    Hyperlipidemia    Hypertension    Paroxysmal atrial fibrillation (HCC)    a.) CHA2DS2VASc = 5 (age x2, HTN, vascular disease, T2DM) as of 08/04/2024; b.) rate/rhythm maintained on oral diltiazem ; no chronic OAC   Renal mass, right    T2DM (type 2 diabetes mellitus) (HCC)    Wears contact lenses     SURGICAL HISTORY: Past  Surgical History:  Procedure Laterality Date   BROW LIFT Right 06/14/2022   Procedure: BLEPHAROPTOSIS REPAIR; RESECT EX RIGHT DIABETIC;  Surgeon: Ashley Greig HERO, MD;  Location: [facility];  Service: Ophthalmology;  Laterality: Right;   COLONOSCOPY  2005   COLONOSCOPY WITH PROPOFOL  N/A 10/16/2016   Procedure: COLONOSCOPY WITH PROPOFOL ;  Surgeon: Reyes LELON Cota, MD;  Location: [facility];  Service: Endoscopy;  Laterality: N/A;   COLONOSCOPY WITH PROPOFOL  N/A 12/25/2021   Procedure: COLONOSCOPY WITH PROPOFOL ;  Surgeon: Jinny Carmine, MD;  Location: [facility];  Service: Endoscopy;   Laterality: N/A;   COLONOSCOPY WITH PROPOFOL  N/A 12/31/2022   Procedure: COLONOSCOPY WITH PROPOFOL ;  Surgeon: Jinny Carmine, MD;  Location: [facility] END[facility]COPY;  Service: Endoscopy;  Laterality: N/A;   ESOPHAGOGASTRODUODENOSCOPY N/A 12/31/2022   Procedure: ESOPHAGOGASTRODUODENOSCOPY (EGD);  Surgeon: Jinny Carmine, MD;  Location: [facility];  Service: Endoscopy;  Laterality: N/A;   ESOPHAGOGASTRODUODENOSCOPY (EGD) WITH PROPOFOL  N/A 12/13/2019   Procedure: ESOPHAGOGASTRODUODENOSCOPY (EGD) WITH BIOPSY;  Surgeon: Jinny Carmine, MD;  Location: [facility];  Service: Endoscopy;  Laterality: N/A;  Diabetic - oral meds   PLANTAR'S WART EXCISION     ROBOT ASSISTED LAPAROSCOPIC NEPHRECTOMY Right 08/10/2024   Procedure: NEPHRECTOMY, RADICAL, ROBOT-ASSISTED, LAPAROSCOPIC, ADULT;  Surgeon: Georganne Penne SAUNDERS, MD;  Location: [facility];  Service: Urology;  Laterality: Right;   STRABISMUS SURGERY Right     SOCIAL HISTORY: Social History   Socioeconomic History   Marital status: Married    Spouse name: Not on file   Number of children: 3   Years of education: Not on file   Highest education level: Bachelor's degree (e.g., BA, AB, BS)  Occupational History   Not on file  Tobacco Use   Smoking status: Former    Current packs/day: 0.00    Types: Cigarettes    Quit date: 1978    Years since quitting: 48.1   Smokeless tobacco: Never   Tobacco comments:    quit in 1978  Vaping Use   Vaping status: Never Used  Substance and Sexual Activity   Alcohol use: Yes    Alcohol/week: 6.0 standard drinks of alcohol    Types: 6 Cans of beer per week    Comment: a couple beers on the weekends   Drug use: No   Sexual activity: Not on file  Other Topics Concern   Not on file  Social History Narrative   Not on file   Social Drivers of Health   Tobacco Use: Medium Risk (10/26/2024)   Patient History    Smoking Tobacco Use: Former    Smokeless Tobacco Use: Never    Passive Exposure: Not on file   Financial Resource Strain: Low Risk  (03/30/2024)   Received from [facility]   Overall Financial Resource Strain (CARDIA)    Difficulty of Paying Living Expenses: Not hard at all  Food Insecurity: No Food Insecurity (08/31/2024)   Epic    Worried About Radiation Protection Practitioner of Food in the Last Year: Never true    Ran Out of Food in the Last Year: Never true  Transportation Needs: No Transportation Needs (08/31/2024)   Epic    Lack of Transportation (Medical): No    Lack of Transportation (Non-Medical): No  Physical Activity: Sufficiently Active (03/18/2023)   Received from [facility]   Exercise Vital Sign    On average, how many days per week do you engage in moderate to strenuous exercise (like a brisk walk)?: 4 days  On average, how many minutes do you engage in exercise at this level?: 70 min  Stress: No Stress Concern Present (03/18/2023)   Received from [facility] - Occupational Stress Questionnaire    Feeling of Stress : Only a little  Social Connections: Socially Integrated (08/10/2024)   Social Connection and Isolation Panel    Frequency of Communication with Friends and Family: More than three times a week    Frequency of Social Gatherings with Friends and Family: More than three times a week    Attends Religious Services: More than 4 times per year    Active Member of Clubs or Organizations: Yes    Attends Banker Meetings: 1 to 4 times per year    Marital Status: Married  Catering Manager Violence: Not At Risk (08/31/2024)   Epic    Fear of Current or Ex-Partner: No    Emotionally Abused: No    Physically Abused: No    Sexually Abused: No  Depression (PHQ2-9): Low Risk (10/19/2024)   Depression (PHQ2-9)    PHQ-2 Score: 0  Alcohol Screen: Low Risk (11/14/2021)   Alcohol Screen    Last Alcohol Screening Score (AUDIT): 3  Housing: Low Risk (08/31/2024)   Epic    Unable  to Pay for Housing in the Last Year: No    Number of Times Moved in the Last Year: 0    Homeless in the Last Year: No  Utilities: Not At Risk (08/31/2024)   Epic    Threatened with loss of utilities: No  Health Literacy: Adequate Health Literacy (03/18/2023)   Received from [facility]   B1300 Health Literacy    Frequency of need for help with medical instructions: Never    FAMILY HISTORY: Family History  Problem Relation Age of Onset   Diabetes Mother    Breast cancer Mother        dx 57s, metastatic   Alcohol abuse Father    Heart disease Maternal Uncle    Rheumatic fever Maternal Uncle    Diabetes Paternal Grandmother    Emphysema Paternal Grandfather     ALLERGIES:  has no known allergies.  MEDICATIONS:  Current Outpatient Medications  Medication Sig Dispense Refill   amoxicillin -clavulanate (AUGMENTIN ) 875-125 MG tablet Take 1 tablet by mouth 2 (two) times daily for 5 days. 10 tablet 0   atorvastatin  (LIPITOR) 20 MG tablet Take 1 tablet (20 mg total) by mouth daily. 90 tablet 3   Blood Glucose Monitoring Suppl (ACCU-CHEK NANO SMARTVIEW) w/Device KIT Use glucometer (Accu-Chek Nano Glucometer) to test fasting glucose daily. 1 kit 0   diltiazem  (CARDIZEM  CD) 120 MG 24 hr capsule Take 1 capsule (120 mg total) by mouth daily. 90 capsule 3   docusate sodium  (COLACE) 100 MG capsule Take 1 capsule (100 mg total) by mouth 2 (two) times daily. 10 capsule 0   empagliflozin  (JARDIANCE ) 25 MG TABS tablet Take 25 mg by mouth.     fluticasone  (FLONASE ) 50 MCG/ACT nasal spray Place 1 spray into both nostrils daily as needed for allergies.     glucose blood (TRUE METRIX BLOOD GLUCOSE TEST) test strip Use as instructed 100 each 12   hyoscyamine  (LEVSIN  SL) 0.125 MG SL tablet Place 0.125 mg under the tongue 2 (two) times daily as needed for cramping.     Lancets (ONETOUCH ULTRASOFT) lancets Use as instructed once a day for fasting glucose check. 100 each 3   losartan  (  COZAAR) 50 MG tablet Take 50 mg by mouth daily.     metFORMIN  (GLUCOPHAGE ) 1000 MG tablet Take 1 tablet (1,000 mg total) by mouth 2 (two) times daily with a meal. 180 tablet 0   methocarbamol  (ROBAXIN ) 500 MG tablet Take 1 tablet (500 mg total) by mouth every 8 (eight) hours as needed for up to 14 days for muscle spasms. 42 tablet 0   Misc Natural Products (OSTEO BI-FLEX ADV DOUBLE ST) CAPS Take 1 capsule by mouth daily.     pantoprazole  (PROTONIX ) 40 MG tablet Take 1 tablet (40 mg total) by mouth daily. 90 tablet 3   pioglitazone  (ACTOS ) 15 MG tablet Take 1 tablet (15 mg total) by mouth daily. 90 tablet 0   Probiotic Product (PROBIOTIC FORMULA) CAPS Take 1 capsule by mouth daily.     prochlorperazine  (COMPAZINE ) 10 MG tablet Take 1 tablet (10 mg total) by mouth every 6 (six) hours as needed for nausea or vomiting. 30 tablet 1   sucralfate  (CARAFATE ) 1 g tablet TAKE 1 TABLET FOUR TIMES DAILY - WITH MEALS AND AT BEDTIME. 360 tablet 0   famotidine  (PEPCID ) 40 MG tablet Take 1 tablet (40 mg total) by mouth at bedtime. (Patient not taking: Reported on 10/26/2024) 90 tablet 3   No current facility-administered medications for this visit.    Review of Systems  Constitutional:  Positive for fatigue. Negative for appetite change, chills and fever.  HENT:   Negative for hearing loss and voice change.   Eyes:  Negative for eye problems.  Respiratory:  Negative for chest tightness and cough.   Cardiovascular:  Negative for chest pain.  Gastrointestinal:  Negative for abdominal distention, abdominal pain and blood in stool.  Endocrine: Negative for hot flashes.  Genitourinary:  Negative for difficulty urinating and frequency.        Frothy urine  Musculoskeletal:  Positive for arthralgias.  Skin:  Negative for itching and rash.  Neurological:  Negative for extremity weakness.  Hematological:  Negative for adenopathy.  Psychiatric/Behavioral:  Negative for confusion.      PHYSICAL  EXAMINATION: ECOG PERFORMANCE STATUS: 0 - Asymptomatic  Vitals:   10/26/24 1128  BP: 111/70  Pulse: 75  Resp: 20  Temp: 97.6 F (36.4 C)  SpO2: 100%   Filed Weights   10/26/24 1128  Weight: 242 lb 14.4 oz (110.2 kg)    Physical Exam Constitutional:      General: He is not in acute distress.    Appearance: He is not diaphoretic.  HENT:     Head: Normocephalic and atraumatic.  Eyes:     General: No scleral icterus.    Comments: Right eye ptosis-chronic  Cardiovascular:     Rate and Rhythm: Normal rate and regular rhythm.  Pulmonary:     Effort: Pulmonary effort is normal. No respiratory distress.     Breath sounds: No wheezing.  Abdominal:     General: There is no distension.     Palpations: Abdomen is soft.     Tenderness: There is no abdominal tenderness.     Comments: Status post right nephrectomy  Musculoskeletal:        General: Normal range of motion.     Cervical back: Normal range of motion and neck supple.  Skin:    General: Skin is warm and dry.     Findings: No erythema.  Neurological:     Mental Status: He is alert and oriented to person, place, and time. Mental status is at baseline.  Motor: No abnormal muscle tone.  Psychiatric:        Mood and Affect: Mood and affect normal.      LABORATORY DATA:  I have reviewed the data as listed    Latest Ref Rng & Units 10/26/2024   11:10 AM 10/19/2024   11:38 AM 10/05/2024   11:02 AM  CBC  WBC 4.0 - 10.5 K/uL 8.7  6.5  6.1   Hemoglobin 13.0 - 17.0 g/dL 84.9  85.8  85.4   Hematocrit 39.0 - 52.0 % 42.5  38.9  40.6   Platelets 150 - 400 K/uL 326  244  260       Latest Ref Rng & Units 10/26/2024   11:10 AM 10/19/2024   11:38 AM 10/05/2024   11:01 AM  CMP  Glucose 70 - 99 mg/dL 646  853  864   BUN 8 - 23 mg/dL 25  18  19    Creatinine 0.61 - 1.24 mg/dL 8.43  8.69  8.53   Sodium 135 - 145 mmol/L 132  134  136   Potassium 3.5 - 5.1 mmol/L 4.5  4.2  4.0   Chloride 98 - 111 mmol/L 95  100  100   CO2 22 -  32 mmol/L 26  23  23    Calcium  8.9 - 10.3 mg/dL 9.2  9.0  9.1   Total Protein 6.5 - 8.1 g/dL 7.2  7.2  7.3   Total Bilirubin 0.0 - 1.2 mg/dL 0.7  0.6  0.7   Alkaline Phos 38 - 126 U/L 98  99  91   AST 15 - 41 U/L 15  21  16    ALT 0 - 44 U/L 17  20  14       RADIOGRAPHIC STUDIES: I have personally reviewed the radiological images as listed and agreed with the findings in the report. No results found.   "

## 2024-10-26 NOTE — Assessment & Plan Note (Signed)
 Kidney function has decreased since his right nephrectomy. Encourage oral hydration and avoid nephrotoxins.

## 2024-10-26 NOTE — Assessment & Plan Note (Signed)
 pT3a pNx stage III clear-cell RCC, G2, status post right radical nephrectomy. UISS prognostic model intermediate-high risk recommend 1 year of pembrolizumab  every 3 weeks [17 cycles] based on data from phase 3 clinical trial Keynote 564.    Labs reviewed and discussed with patient.  Hold off cycle 3 Keytruda  given that he was recently treated with prednisone .  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  Continuing Keytruda  may further flareup his symptoms which require high-dose prednisone .  Recommend patient to establish care with rheumatology for evaluation.  Referrals were sent.  Meanwhile, I will see him back in 2 weeks, try Keytruda  with low-dose prednisone  5 to 10 mg daily and see if this may help him to cope with the arthritic pain.

## 2024-10-26 NOTE — Assessment & Plan Note (Signed)
 Possible inflammatory arthritis secondary to immunotherapy. Negative ANA, RF is mildly elevated 14.2, negative anti-SSA/SSB, CCP negative, CRP mildly elevated at 2, ESR elevated 35, CK 66 normal. Less likely he has myositis.  Possibly arthralgia due to inflammatory arthritis secondary to Keytruda .  Refer to rheumatology.  NSAIDs are contraindicated due to CKD

## 2024-10-27 ENCOUNTER — Telehealth: Payer: Self-pay | Admitting: Licensed Clinical Social Worker

## 2024-10-27 ENCOUNTER — Telehealth: Payer: Self-pay | Admitting: *Deleted

## 2024-10-27 ENCOUNTER — Ambulatory Visit: Payer: Self-pay | Admitting: Licensed Clinical Social Worker

## 2024-10-27 DIAGNOSIS — Z1379 Encounter for other screening for genetic and chromosomal anomalies: Secondary | ICD-10-CM

## 2024-10-27 LAB — T4: T4, Total: 7.6 ug/dL (ref 4.5–12.0)

## 2024-10-27 NOTE — Telephone Encounter (Signed)
 Caller verified using pt's full name and dob prior to discussing PHI   Patient is requesting a different antibiotics for his ear pressure. He has completed his course of Augmentin . Patient saw Dr. Babara yesterday in the clinic. No fever, no cough. History of sinus pain. Please advise.

## 2024-10-27 NOTE — Progress Notes (Signed)
 HPI:   Mr. Barrick was previously seen in the [facility] due to a personal and family history of cancer and concerns regarding a hereditary predisposition to cancer. Please refer to our prior cancer genetics clinic note for more information regarding our discussion, assessment and recommendations, at the time. Mr. Weigelt recent genetic test results were disclosed to him, as were recommendations warranted by these results. These results and recommendations are discussed in more detail below.  CANCER HISTORY:  Oncology History  Renal cell carcinoma (HCC)  04/01/2024 Imaging   Complete abdomen ultrasound showed 1. No acute abnormality. 2. Hepatic steatosis. 3. Complex right renal cyst measuring up to 7.0 cm. Further evaluation with nonemergent renal protocol CT or MRI with and without IV contrast is recommended.   05/18/2024 Imaging   MRI abdomen with and without contrast showed 1. 6.4 cm complex septated cystic lesion medially in the upper pole of the right kidney with thin enhancing septations in the superior component. This is consistent with an indeterminate cystic renal mass, best classified as Bosniak III. Urology consultation recommended. 2. Additional simple cyst in the interpolar region of the right kidney (Bosniak 1). No suspicious left renal lesions. 3. No adenopathy. 4. No acute abdominal findings   08/10/2024 Surgery   Patient underwent right radical nephrectomy  Pathology showed 1. Kidney, radical nephrectomy for tumor, right kidney :       RENAL CELL CARCINOMA, CLEAR-CELL TYPE, MULTICYSTIC, 4.5 CM.       TUMOR FOCALLY EXTENDS INTO RENAL SINUS ADIPOSE TISSUE.       NEGATIVE FOR LYMPHOVASCULAR INVASION.       ALL SURGICAL MARGINS NEGATIVE FOR TUMOR.       SEE ONCOLOGY TABLE AND COMMENT   KIDNEY: Nephrectomy Procedure: Right nephrectomy Specimen Laterality: Right Tumor Size: 4.5 x 4 x 3 cm Tumor Focality: Unifocal Histologic Type: Multicystic  clear-cell renal cell carcinoma Sarcomatoid Features: Not identified Rhabdoid Features: Not identified Histologic Grade: WHO grade 2 Tumor Necrosis: Minimal Tumor Extension: Into renal sinus adipose tissue Lymphatic and/or Vascular Invasion: Not identified Margins: All surgical margins negative for tumor Regional Lymph Nodes: No lymph nodes submitted Distant Metastasis: Distant Site(s) Involved: Not applicable Additional Findings in Nonneoplastic Kidney: No significant findings Pathologic Stage Classification (pTNM, AJCC 8th Edition): pT3a, pN not assigned Ancillary Studies: Immunohistochemistry, see comment Representative Tumor Block: 1D-1J Comment(s): The tumor is predominantly cystic with cyst lined by cells with clear cytoplasm. Immunohistochemistry shows the tumor cells are positive with CA-IX and cytokeratin 7 with mostly negative staining with CD10 and P504S. The morphologic features are consistent with clear-cell type renal cell carcinoma predominantly cystic which focally extends into renal sinus adipose tissue.   08/16/2024 Initial Diagnosis   Renal cell carcinoma (HCC)   08/31/2024 Cancer Staging   Staging form: Kidney, AJCC 8th Edition - Pathologic stage from 08/31/2024: Stage III (pT3a, pN0, cM0) - Signed by Babara Call, MD on 08/31/2024 Stage prefix: Initial diagnosis   09/14/2024 -  Chemotherapy   Patient is on Treatment Plan : Pembrolizumab  (200) q21d     10/25/2024 Genetic Testing   Negative genetic testing. Report date is 10/25/2024.  [facility]'s MultiCancer +RNA panel includes analysis of the following 70 genes: AIP, ALK, APC, ATM, AXIN2, BAP1, BARD1, BLM, BMPR1A, BRCA1, BRCA2, BRIP1, CDC73, CDH1, CDK4, CDKN1B, CDKN2A, CHEK2, CTNNA1, DICER1, EGFR, EPCAM, FH, FLCN, GREM1, HOXB13, KIT, LZTR1, MAX, MBD4, MEN1, MET, MITF, MLH1, MSH2, MSH6, MUTYH, NF1, NF2, NTHL1, PALB2, PDGFRA, PMS2, POLD1, POLE, POT1, PRKAR1A, PTCH1, PTEN,  RAD51C, RAD51D, RB1, RET, SDHA, SDHAF2, SDHB, SDHC, SDHD, SMAD4,  SMARCA4, SMARCB1, SMARCE1, STK11, SUFU, TMEM127, TP53, TSC1, TSC2, VHL.        FAMILY HISTORY:  We obtained a detailed, 4-generation family history.  Significant diagnoses are listed below: Family History  Problem Relation Age of Onset   Diabetes Mother    Breast cancer Mother        dx 48s, metastatic   Alcohol abuse Father    Heart disease Maternal Uncle    Rheumatic fever Maternal Uncle    Diabetes Paternal Grandmother    Emphysema Paternal Grandfather          Mother - breast cancer dx 16s, metastatic   Mr. Kendrix is unaware of previous family history of genetic testing for hereditary cancer risks. There is no reported Ashkenazi Jewish ancestry. There is no known consanguinity.   GENETIC TEST RESULTS:  The [facility] Multi-Cancer+RNA Panel Panel found no pathogenic mutations.   The Multi-Cancer + RNA Panel offered by [facility] includes sequencing and/or deletion/duplication analysis of the following 70 genes:  AIP*, ALK, APC*, ATM*, AXIN2*, BAP1*, BARD1*, BLM*, BMPR1A*, BRCA1*, BRCA2*, BRIP1*, CDC73*, CDH1*, CDK4, CDKN1B*, CDKN2A, CHEK2*, CTNNA1*, DICER1*, EPCAM, EGFR, FH*, FLCN*, GREM1, HOXB13, KIT, LZTR1, MAX*, MBD4, MEN1*, MET, MITF, MLH1*, MSH2*, MSH3*, MSH6*, MUTYH*, NF1*, NF2*, NTHL1*, PALB2*, PDGFRA, PMS2*, POLD1*, POLE*, POT1*, PRKAR1A*, PTCH1*, PTEN*, RAD51C*, RAD51D*, RB1*, RET, SDHA*, SDHAF2*, SDHB*, SDHC*, SDHD*, SMAD4*, SMARCA4*, SMARCB1*, SMARCE1*, STK11*, SUFU*, TMEM127*, TP53*, TSC1*, TSC2*, VHL*. RNA analysis is performed for * genes.   The test report has been scanned into EPIC and is located under the Molecular Pathology section of the Results Review tab.  A portion of the result report is included below for reference. Genetic testing reported out on 10/25/2024.      Even though a pathogenic variant was not identified, possible explanations for the cancer in the family may include: There may be no hereditary risk for cancer in the family. The cancers in Mr. Krauser  and/or his family may be sporadic/familial or due to other genetic and environmental factors. There may be a gene mutation in one of these genes that current testing methods cannot detect but that chance is small. There could be another gene that has not yet been discovered, or that we have not yet tested, that is responsible for the cancer diagnoses in the family.  It is also possible there is a hereditary cause for the cancer in the family that Mr. Carriger did not inherit.  Therefore, it is important to remain in touch with cancer genetics in the future so that we can continue to offer Mr. Bais the most up to date genetic testing.   ADDITIONAL GENETIC TESTING:  We discussed with Mr. Jewel that his genetic testing was fairly extensive.  If there are additional relevant genes identified to increase cancer risk that can be analyzed in the future, we would be happy to discuss and coordinate this testing at that time.    CANCER SCREENING RECOMMENDATIONS:  Mr. Capell test result is considered negative (normal).  This means that we have not identified a hereditary cause for his personal and family history of cancer at this time.   An individual's cancer risk and medical management are not determined by genetic test results alone. Overall cancer risk assessment incorporates additional factors, including personal medical history, family history, and any available genetic information that may result in a personalized plan for cancer prevention and surveillance. Therefore, it is recommended he continue to  follow the cancer management and screening guidelines provided by his oncology and primary healthcare provider.  RECOMMENDATIONS FOR FAMILY MEMBERS:   Since he did not inherit a identifiable mutation in a cancer predisposition gene included on this panel, his children could not have inherited a known mutation from him in one of these genes. Individuals in this family might be at some increased risk of  developing cancer, over the general population risk, due to the family history of cancer.  Individuals in the family should notify their providers of the family history of cancer. We recommend women in this family have a yearly mammogram beginning at age 68, or 31 years younger than the earliest onset of cancer, an annual clinical breast exam, and perform monthly breast self-exams.  Family members should have colonoscopies by at age 39, or earlier, as recommended by their providers.   FOLLOW-UP:  Lastly, we discussed with Mr. Jeanty that cancer genetics is a rapidly advancing field and it is possible that new genetic tests will be appropriate for him and/or his family members in the future. We encouraged him to remain in contact with cancer genetics on an annual basis so we can update his personal and family histories and let him know of advances in cancer genetics that may benefit this family.   Our contact number was provided. Mr. Lawry questions were answered to his satisfaction, and he knows he is welcome to call us  at anytime with additional questions or concerns.    Dena Cary, MS, [facility] Genetic Counselor [email or web address] Phone: [phone]

## 2024-10-27 NOTE — Telephone Encounter (Signed)
 I contacted  Steven Miranda to discuss his genetic testing results. No pathogenic variants were identified in the 70 genes analyzed. Discussed that we do not know why he has cancer or why there is cancer in the family. It could be due to a different gene that we are not testing, or maybe our current technology may not be able to pick something up.  It will be important for him to keep in contact with genetics to keep up with whether additional testing may be needed.Detailed clinic note to follow.   The test report will be scanned into EPIC and will be located under the Molecular Pathology section of the Results Review tab.  A portion of the result report is included below for reference.

## 2024-10-27 NOTE — Telephone Encounter (Signed)
Referral faxed to [facility] 

## 2024-10-28 ENCOUNTER — Encounter: Payer: Self-pay | Admitting: Oncology

## 2024-10-28 NOTE — Telephone Encounter (Signed)
 Pt aware of plan of care- msg him yesterday via mychart.

## 2024-10-29 NOTE — Telephone Encounter (Signed)
 Pt scheduled on 2/5. Confirmed with ENT office.

## 2024-11-01 ENCOUNTER — Other Ambulatory Visit: Payer: Self-pay | Admitting: Oncology

## 2024-11-01 ENCOUNTER — Encounter: Payer: Self-pay | Admitting: Oncology

## 2024-11-01 MED ORDER — PREDNISONE 10 MG PO TABS: 10.0000 mg | ORAL_TABLET | Freq: Every day | ORAL | 1 refills | Status: AC

## 2024-11-02 NOTE — Telephone Encounter (Signed)
Dr. Cathie Hoops has talked to pt

## 2024-11-05 ENCOUNTER — Encounter: Payer: Self-pay | Admitting: Oncology

## 2024-11-08 ENCOUNTER — Inpatient Hospital Stay

## 2024-11-08 ENCOUNTER — Inpatient Hospital Stay: Attending: Oncology

## 2024-11-08 ENCOUNTER — Inpatient Hospital Stay: Admitting: Oncology

## 2024-11-09 ENCOUNTER — Inpatient Hospital Stay

## 2024-11-09 ENCOUNTER — Inpatient Hospital Stay: Admitting: Oncology

## 2024-11-16 ENCOUNTER — Inpatient Hospital Stay

## 2024-11-16 ENCOUNTER — Inpatient Hospital Stay: Admitting: Oncology

## 2024-11-30 ENCOUNTER — Inpatient Hospital Stay

## 2024-11-30 ENCOUNTER — Inpatient Hospital Stay: Admitting: Oncology

## 2024-12-09 ENCOUNTER — Other Ambulatory Visit

## 2024-12-24 ENCOUNTER — Ambulatory Visit: Admitting: Urology

## 2025-03-22 ENCOUNTER — Ambulatory Visit: Admitting: Dermatology
